# Patient Record
Sex: Female | Born: 1960
Health system: Southern US, Community
[De-identification: ages and names within clinical notes are randomized; demographics above are authoritative.]

## PROBLEM LIST (undated history)

## (undated) DIAGNOSIS — Z972 Presence of dental prosthetic device (complete) (partial): Secondary | ICD-10-CM

## (undated) DIAGNOSIS — Z9889 Other specified postprocedural states: Secondary | ICD-10-CM

## (undated) DIAGNOSIS — M797 Fibromyalgia: Secondary | ICD-10-CM

## (undated) DIAGNOSIS — M1990 Unspecified osteoarthritis, unspecified site: Secondary | ICD-10-CM

## (undated) DIAGNOSIS — T7840XA Allergy, unspecified, initial encounter: Secondary | ICD-10-CM

## (undated) DIAGNOSIS — J449 Chronic obstructive pulmonary disease, unspecified: Secondary | ICD-10-CM

## (undated) DIAGNOSIS — F419 Anxiety disorder, unspecified: Secondary | ICD-10-CM

## (undated) DIAGNOSIS — R112 Nausea with vomiting, unspecified: Secondary | ICD-10-CM

## (undated) DIAGNOSIS — F32A Depression, unspecified: Secondary | ICD-10-CM

## (undated) DIAGNOSIS — K219 Gastro-esophageal reflux disease without esophagitis: Secondary | ICD-10-CM

## (undated) DIAGNOSIS — M51369 Other intervertebral disc degeneration, lumbar region without mention of lumbar back pain or lower extremity pain: Secondary | ICD-10-CM

## (undated) DIAGNOSIS — R0600 Dyspnea, unspecified: Secondary | ICD-10-CM

## (undated) DIAGNOSIS — F319 Bipolar disorder, unspecified: Secondary | ICD-10-CM

## (undated) DIAGNOSIS — J45909 Unspecified asthma, uncomplicated: Secondary | ICD-10-CM

## (undated) DIAGNOSIS — M5126 Other intervertebral disc displacement, lumbar region: Secondary | ICD-10-CM

## (undated) DIAGNOSIS — M5136 Other intervertebral disc degeneration, lumbar region: Secondary | ICD-10-CM

## (undated) DIAGNOSIS — F329 Major depressive disorder, single episode, unspecified: Secondary | ICD-10-CM

## (undated) DIAGNOSIS — G4730 Sleep apnea, unspecified: Secondary | ICD-10-CM

## (undated) HISTORY — PX: WRIST GANGLION EXCISION: SUR520

## (undated) HISTORY — PX: SPINE SURGERY: SHX786

## (undated) HISTORY — DX: Allergy, unspecified, initial encounter: T78.40XA

## (undated) HISTORY — PX: ABDOMINAL HYSTERECTOMY: SHX81

## (undated) HISTORY — DX: Anxiety disorder, unspecified: F41.9

## (undated) HISTORY — PX: JOINT REPLACEMENT: SHX530

## (undated) HISTORY — PX: ABDOMINAL HYSTERECTOMY W/ PARTIAL VAGINACTOMY: SUR660

## (undated) HISTORY — PX: COLONOSCOPY: SHX174

## (undated) HISTORY — DX: Depression, unspecified: F32.A

## (undated) HISTORY — DX: Major depressive disorder, single episode, unspecified: F32.9

## (undated) HISTORY — DX: Unspecified asthma, uncomplicated: J45.909

## (undated) HISTORY — PX: TUBAL LIGATION: SHX77

## (undated) HISTORY — DX: Bipolar disorder, unspecified: F31.9

---

## 1999-01-22 ENCOUNTER — Other Ambulatory Visit: Admission: RE | Admit: 1999-01-22 | Discharge: 1999-01-22 | Payer: Self-pay | Admitting: Orthopaedic Surgery

## 2000-03-11 ENCOUNTER — Other Ambulatory Visit: Admission: RE | Admit: 2000-03-11 | Discharge: 2000-03-11 | Payer: Self-pay | Admitting: *Deleted

## 2006-02-03 ENCOUNTER — Emergency Department: Payer: Self-pay | Admitting: Emergency Medicine

## 2006-07-12 ENCOUNTER — Inpatient Hospital Stay (HOSPITAL_COMMUNITY): Admission: RE | Admit: 2006-07-12 | Discharge: 2006-07-15 | Payer: Self-pay | Admitting: Psychiatry

## 2006-07-12 ENCOUNTER — Emergency Department: Payer: Self-pay | Admitting: General Practice

## 2006-07-12 ENCOUNTER — Ambulatory Visit: Payer: Self-pay | Admitting: Psychiatry

## 2008-10-13 ENCOUNTER — Emergency Department: Payer: Self-pay | Admitting: Internal Medicine

## 2010-06-21 HISTORY — PX: CARDIAC CATHETERIZATION: SHX172

## 2011-06-15 ENCOUNTER — Emergency Department: Payer: Self-pay | Admitting: Emergency Medicine

## 2011-06-25 DIAGNOSIS — R109 Unspecified abdominal pain: Secondary | ICD-10-CM | POA: Insufficient documentation

## 2011-10-25 DIAGNOSIS — Z720 Tobacco use: Secondary | ICD-10-CM | POA: Insufficient documentation

## 2011-10-25 DIAGNOSIS — D8989 Other specified disorders involving the immune mechanism, not elsewhere classified: Secondary | ICD-10-CM | POA: Insufficient documentation

## 2011-10-25 DIAGNOSIS — K219 Gastro-esophageal reflux disease without esophagitis: Secondary | ICD-10-CM | POA: Insufficient documentation

## 2011-10-25 DIAGNOSIS — G9332 Myalgic encephalomyelitis/chronic fatigue syndrome: Secondary | ICD-10-CM | POA: Insufficient documentation

## 2012-02-20 LAB — BASIC METABOLIC PANEL
Anion Gap: 6 — ABNORMAL LOW (ref 7–16)
Calcium, Total: 9.3 mg/dL (ref 8.5–10.1)
Co2: 26 mmol/L (ref 21–32)
EGFR (African American): >60
EGFR (Non-African Amer.): >60
Glucose: 99 mg/dL (ref 65–99)
Osmolality: 274 (ref 275–301)

## 2012-02-20 LAB — CBC
HCT: 40.6 % (ref 35.0–47.0)
HGB: 13.8 g/dL (ref 12.0–16.0)
MCHC: 34.1 g/dL (ref 32.0–36.0)
RDW: 13.7 % (ref 11.5–14.5)
WBC: 11.6 10*3/uL — ABNORMAL HIGH (ref 3.6–11.0)

## 2012-02-21 ENCOUNTER — Inpatient Hospital Stay: Payer: Self-pay | Admitting: Internal Medicine

## 2012-02-22 LAB — BASIC METABOLIC PANEL
Anion Gap: 5 — ABNORMAL LOW (ref 7–16)
BUN: 8 mg/dL (ref 7–18)
Calcium, Total: 8.7 mg/dL (ref 8.5–10.1)
Creatinine: 0.87 mg/dL (ref 0.60–1.30)
EGFR (African American): >60
Glucose: 107 mg/dL — ABNORMAL HIGH (ref 65–99)
Sodium: 140 mmol/L (ref 136–145)

## 2012-02-22 LAB — CBC WITH DIFFERENTIAL/PLATELET
Basophil #: 0.1 10*3/uL (ref 0.0–0.1)
Basophil %: 0.7 %
HCT: 41.5 % (ref 35.0–47.0)
Lymphocyte #: 4.5 10*3/uL — ABNORMAL HIGH (ref 1.0–3.6)
Lymphocyte %: 45.1 %
MCH: 29.2 pg (ref 26.0–34.0)
MCHC: 34.1 g/dL (ref 32.0–36.0)
MCV: 86 fL (ref 80–100)
Monocyte %: 7 %
Neutrophil #: 4.6 10*3/uL (ref 1.4–6.5)
Platelet: 221 10*3/uL (ref 150–440)
RDW: 13.4 % (ref 11.5–14.5)
WBC: 10.1 10*3/uL (ref 3.6–11.0)

## 2012-02-22 LAB — VANCOMYCIN, TROUGH: Vancomycin, Trough: 13 ug/mL (ref 10–20)

## 2012-02-26 LAB — CULTURE, BLOOD (SINGLE)

## 2012-10-10 DIAGNOSIS — F3181 Bipolar II disorder: Secondary | ICD-10-CM | POA: Insufficient documentation

## 2013-02-18 DIAGNOSIS — E669 Obesity, unspecified: Secondary | ICD-10-CM | POA: Insufficient documentation

## 2013-11-27 ENCOUNTER — Ambulatory Visit: Payer: Self-pay | Admitting: Internal Medicine

## 2014-02-13 DIAGNOSIS — Z820 Family history of epilepsy and other diseases of the nervous system: Secondary | ICD-10-CM | POA: Insufficient documentation

## 2014-02-13 DIAGNOSIS — G479 Sleep disorder, unspecified: Secondary | ICD-10-CM | POA: Insufficient documentation

## 2014-02-13 DIAGNOSIS — R2689 Other abnormalities of gait and mobility: Secondary | ICD-10-CM | POA: Insufficient documentation

## 2014-02-13 DIAGNOSIS — R5383 Other fatigue: Secondary | ICD-10-CM | POA: Insufficient documentation

## 2014-02-13 DIAGNOSIS — R252 Cramp and spasm: Secondary | ICD-10-CM | POA: Insufficient documentation

## 2014-04-22 DIAGNOSIS — E782 Mixed hyperlipidemia: Secondary | ICD-10-CM | POA: Insufficient documentation

## 2014-10-08 NOTE — H&P (Signed)
PATIENT NAME:  Denise Spencer, Denise Spencer MR#:  [id number] DATE OF BIRTH:  12-21-1960  DATE OF ADMISSION:  02/21/2012  REFERRING PHYSICIAN: Ferman Hamming, MD  PRIMARY CARE PHYSICIAN: Dr. Leodis Sias [facility]  CHIEF COMPLAINT: Left facial swelling and tenderness.   HISTORY OF PRESENT ILLNESS: This is a 54 year old female who presented to her dentist two days before complaining of left side toothache and oral pain where she told her she had a tooth infection where she started her on p.o. Flagyl where she was instructed to follow-up in one week for tooth extraction. The patient reports the antibiotic did not help her. Her symptoms worsened over the last two days. She complains of significant pain and worsening of the swelling and she is not able to chew and take p.o. intake because of her pain. In the ED, the patient was afebrile, did not have any leukocytosis. CT of neck soft tissue done did not show any evidence of abscess, but did show perimandibular inflammation and left facial inflammation as well. The patient has multiple drug allergies. The hospitalist service was requested to admit the patient for IV antibiotic treatment and IV pain management.   PAST MEDICAL HISTORY:  1. Bipolar disorder.  2. Anxiety.  3. Agoraphobia.   PAST SURGICAL HISTORY:  1. Tubal ligation.  2. Cesarean section.  3. Ganglion cyst removal from right hand.  4. Recent cervical node biopsy which was negative.   FAMILY HISTORY: Denies any history of coronary artery disease, diabetes, or hypertension.   SOCIAL HISTORY: The patient is unemployed. She smokes 1 pack per day. She denies any alcohol or substance abuse.   ALLERGIES: The patient has multiple drug allergies. Aleve, amoxicillin, ampicillin, aspirin, bacitracin, Biaxin, Cipro, clindamycin, codeine, Darvocet, Darvon, doxycycline, erythromycin, Keflex, sulfa drugs, tetracycline, and Tylenol.  HOME MEDICATIONS: 1. Clonazepam 20 mg at bedtime.  2. Effexor-XR 150 mg daily.   3. Omeprazole 20 mg twice a day. 4. Risperdal 0.75 mg at bedtime.  5. Sertraline 150 mg daily.   REVIEW OF SYSTEMS: CONSTITUTIONAL: The patient denies any fever, fatigue, or weakness. EYES: Denies double vision, blurry vision, or pain. ENT: Denies tinnitus, ear pain, or hearing loss. Has left side toothache with swelling and left facial swelling and pain. CARDIOVASCULAR: Denies chest pain, edema, arrhythmia, or palpitations. GASTROINTESTINAL: Denies nausea, vomiting, diarrhea, abdominal pain, or hematemesis. GENITOURINARY: Denies dysuria, hematuria, or renal colic. ENDOCRINE: Denies polyuria, polydipsia, or heat or cold intolerance. HEMATOLOGY: Denies anemia, easy bruising, or bleeding diathesis. MUSCULOSKELETAL: Denies any neck pain, shoulder pain, knee pain, swelling, or gout. NEUROLOGIC: Denies numbness, weakness, dysarthria, epilepsy, tremors, or vertigo. PSYCH: Has history of anxiety and bipolar disorder. Denies any substance or alcohol abuse.   PHYSICAL EXAMINATION:   VITAL SIGNS: Temperature 98.5, pulse 82, respiratory rate 18, blood pressure 134/80, and saturating 96% on room air.   GENERAL: Well-nourished female who lies comfortable in bed, in no apparent distress.   HEENT: Head atraumatic. Left side facial swelling, mainly in the cheek area. Mild tenderness to palpation. No erythema. Poor oral hygiene with multiple dental caries on the left side.   NECK: Has tenderness to palpation on the left side with some cervical lymphadenopathy.   CHEST: Good air entry bilaterally. No wheezing, rales, or rhonchi.   CARDIOVASCULAR: S1 and S2 heard. No rubs, murmurs, or gallops.   ABDOMEN: Soft, nontender, and nondistended. Bowel sounds present.   EXTREMITIES: No edema. No clubbing or cyanosis.   PSYCHIATRIC: Appropriate affect, awake and alert x3. Intact judgment and insight.  NEUROLOGIC: Cranial nerves grossly intact. Motor five out of five in all extremities.   PERTINENT RESULTS:  Glucose 99, BUN 13, creatinine 0.87, sodium 137, potassium 4, chloride 105, and CO2 26. Anion gap 6. White blood cells 11.6, hemoglobin 13.8, hematocrit 40.6, and platelets 209.   CT of neck soft tissue is showing swelling and induration involving the left facial and mandibular region consistent with localized inflammatory process. A definite abscess is not confirmed.  ASSESSMENT AND PLAN:  1. Oral dental infection. The patient will be admitted for IV pain medications as well as for IV antibiotic administration as she failed outpatient p.o. Flagyl. Secondary to her multiple drug allergies, we decided on IV vancomycin. As well she will be on full liquid diet as she is unable to chew from her pain.  2. Tobacco abuse. The patient was counseled and will be started on NicoDerm patch.  3. Bipolar disorder. We will continue the patient on her home medications.  4. Anxiety. We will continue the patient on her home medications.  5. Deep vein thrombosis prophylaxis. Subcutaneous heparin.  6. Gastrointestinal prophylaxis. PPI.   CODE STATUS: FULL CODE.   TOTAL TIME SPENT ON PATIENT CARE: 40 minutes.  ____________________________ Albertine Patricia, MD dse:slb D: 02/21/2012 01:03:23 ET     T: 02/21/2012 12:11:38 ET        JOB#: [id number] cc: Albertine Patricia, MD, <Dictator> PCP - Dr. Leodis Sias - Festus Aloe Zuma Hust Graciela Husbands MD ELECTRONICALLY SIGNED 02/22/2012 0:27

## 2014-10-08 NOTE — Discharge Summary (Signed)
PATIENT NAME:  Denise Spencer, Denise Spencer MR#:  [id number] DATE OF BIRTH:  [birth year]  DATE OF ADMISSION:  02/21/2012 DATE OF DISCHARGE:  02/23/2012  DISCHARGE DIAGNOSES:  1. Left-sided oral dental infection, improving on clindamycin.  2. Tobacco abuse. Counseled for about three minutes. She is on a nicotine patch and is trying to quit.   SECONDARY DIAGNOSES:  1. Bipolar disorder.  2. Anxiety.  3. Agoraphobia.  CONSULTATIONS: None.   PROCEDURES/RADIOLOGY: CT scan of the neck with contrast on 09/01 showed findings consistent with underlying inflammatory change and induration in the perimandibular soft tissues on the left without defined abscess. Small reactive lymph node in the cervical chain. No adenopathy.   MAJOR LABORATORY PANEL: Blood cultures times two were negative.   HISTORY AND SHORT HOSPITAL COURSE: The patient is a 54 year old female with the above-mentioned medical problems who was admitted for oral dental infection.  She was started on IV vancomycin and eventually was switched over to clindamycin. She had significant improvement. Considering her known allergy to clindamycin she was watched one more day to make sure she did not have any severe reaction to clindamycin and she did well with clindamycin without any allergic reaction.  With clinical improvement she was discharged home in stable condition on 09/04.  VITAL SIGNS: On the date of discharge, her vital signs were as follows: Temperature 97.9, heart rate 77 per minute, respirations 18 per minute, blood pressure 115/74 mmHg.  She was saturating 98% on room air.   PERTINENT PHYSICAL EXAMINATION ON THE DATE OF DISCHARGE: CARDIOVASCULAR: S1, S2 normal. No murmurs, rubs, or gallop. LUNGS: Clear to auscultation bilaterally. No wheezes, rales, rhonchi, or crepitation. ABDOMEN: Soft, benign.  NEUROLOGIC: Nonfocal examination.  HEENT: Oral cavity showed left-sided facial swelling mainly around her cheek area. She did have minimal tenderness to  palpation and intraoral exam showed  poor oral hygiene with multiple dental caries on the left side. All other physical examination remained at baseline.   DISCHARGE MEDICATIONS: 1. Clonazepam 0.5 mg, 4 tablets p.o. at bedtime. 2. Risperdal 0.25 mg, 3 tablets p.o. at bedtime.  3. Sertraline 150 mg p.o. daily.  4. Effexor XR 75 mg, 2 capsules p.o. every morning.  5. Omeprazole 20 mg p.o. b.i.d.  6. Nicotine 21 mg transdermal patch once daily.  7. Clindamycin 300 mg p.o. every six hours for a total of nine more days.  8. Acetaminophen/hydrocodone 325/10 mg 1 tablet every six hours for five days as needed.   DISCHARGE DIET: Regular.   DISCHARGE ACTIVITY: As tolerated.   DISCHARGE INSTRUCTIONS AND FOLLOWUP: 1. The patient was instructed to follow up with her primary care physician, Dr. Apolonio Schneiders, in 1 to 2 weeks.  2. He will need followup with her regular dentist also after following up with her primary care physician in about 1 to 2 weeks.   TOTAL TIME DISCHARGING THIS PATIENT: 55 minutes.  ____________________________ Lucina Mellow. Manuella Ghazi, MD vss:bjt D: 02/23/2012 23:16:06 ET T: 02/25/2012 11:39:36 ET JOB#: [id number]  cc: Vianne Bulls. Arline Asp, [place] MD ELECTRONICALLY SIGNED 02/25/2012 23:23

## 2014-12-17 ENCOUNTER — Other Ambulatory Visit: Payer: Self-pay | Admitting: Internal Medicine

## 2014-12-17 DIAGNOSIS — Z1231 Encounter for screening mammogram for malignant neoplasm of breast: Secondary | ICD-10-CM

## 2014-12-18 ENCOUNTER — Ambulatory Visit
Admission: RE | Admit: 2014-12-18 | Discharge: 2014-12-18 | Disposition: A | Discharge status: Home / Self Care | Payer: Medicare HMO | Source: Ambulatory Visit | Attending: Internal Medicine | Admitting: Internal Medicine

## 2014-12-18 DIAGNOSIS — Z1231 Encounter for screening mammogram for malignant neoplasm of breast: Secondary | ICD-10-CM | POA: Diagnosis not present

## 2014-12-20 ENCOUNTER — Emergency Department: Payer: Medicare HMO

## 2014-12-20 ENCOUNTER — Emergency Department
Admission: EM | Admit: 2014-12-20 | Discharge: 2014-12-20 | Disposition: A | Discharge status: Home / Self Care | Payer: Medicare HMO | Attending: Student | Admitting: Student

## 2014-12-20 DIAGNOSIS — Y998 Other external cause status: Secondary | ICD-10-CM | POA: Insufficient documentation

## 2014-12-20 DIAGNOSIS — S99911A Unspecified injury of right ankle, initial encounter: Secondary | ICD-10-CM | POA: Diagnosis present

## 2014-12-20 DIAGNOSIS — Y9289 Other specified places as the place of occurrence of the external cause: Secondary | ICD-10-CM | POA: Insufficient documentation

## 2014-12-20 DIAGNOSIS — S93401A Sprain of unspecified ligament of right ankle, initial encounter: Secondary | ICD-10-CM | POA: Insufficient documentation

## 2014-12-20 DIAGNOSIS — Y9389 Activity, other specified: Secondary | ICD-10-CM | POA: Insufficient documentation

## 2014-12-20 DIAGNOSIS — W1839XA Other fall on same level, initial encounter: Secondary | ICD-10-CM | POA: Diagnosis not present

## 2014-12-20 MED ORDER — HYDROCODONE-ACETAMINOPHEN 5-325 MG PO TABS
1.0000 | ORAL_TABLET | ORAL | Status: AC
  Administered 2014-12-20: 1 via ORAL

## 2014-12-20 MED ORDER — HYDROCODONE-ACETAMINOPHEN 5-325 MG PO TABS
ORAL_TABLET | ORAL | Status: AC
  Administered 2014-12-20: 1 via ORAL
  Filled 2014-12-20: qty 1

## 2014-12-20 MED ORDER — HYDROCODONE-ACETAMINOPHEN 5-325 MG PO TABS: 1.0000 | ORAL_TABLET | Freq: Four times a day (QID) | ORAL | Status: DC | PRN

## 2014-12-20 NOTE — ED Notes (Signed)
Pt states tripped in a hole at 230 injuring right ankle. Pt with swelling noted to right ankle. Cap refill approx 4 seconds to toes, motor intact, pt states sensation dimmed.

## 2014-12-20 NOTE — ED Notes (Signed)
Pt reports injury to right ankle after stepping in a hole and twisting it.  Pt reports some tingling to toes, but still has some sensation.  Motor and circulation intact.  Pt NAD at this time.  Ice in place over ankle.

## 2014-12-20 NOTE — Discharge Instructions (Signed)

## 2014-12-20 NOTE — ED Provider Notes (Signed)
CSN: [id number]     Arrival date & time 12/20/14  2014 History   First MD Initiated Contact with Patient 12/20/14 2152     Chief Complaint  Patient presents with  . Ankle Injury     (Consider location/radiation/quality/duration/timing/severity/associated sxs/prior Treatment) HPI 54 year old female presents today for evaluation of right ankle pain. Patient fell at approximately 2:30 PM as she was walking and rolled her right ankle. Patient describes 7 out of 10 lateral ankle pain along the lateral malleolus and ATFL ligament. She has been unable to bear weight. She has had swelling. She denies any popping. She denies any knee or hip pain. Patient is unable tolerate NSAIDs.   No past medical history on file. No past surgical history on file. Family History  Problem Relation Age of Onset  . Breast cancer Mother 36   History  Substance Use Topics  . Smoking status: Not on file  . Smokeless tobacco: Not on file  . Alcohol Use: Not on file   OB History    No data available     Review of Systems  Constitutional: Negative for activity change and appetite change.  Cardiovascular: Negative for chest pain and leg swelling.  Gastrointestinal: Negative for abdominal pain.  Musculoskeletal: Positive for joint swelling and gait problem. Negative for back pain and neck pain.  Skin: Negative for color change, rash and wound.  Neurological: Negative for dizziness, syncope and weakness.  Psychiatric/Behavioral: Negative for hallucinations and confusion.  All other systems reviewed and are negative.     Allergies  Aspirin; Doxycycline; Erythromycin; Nsaids; and Penicillins  Home Medications   Prior to Admission medications   Medication Sig Start Date End Date Taking? Authorizing Provider  HYDROcodone-acetaminophen (NORCO) 5-325 MG per tablet Take 1 tablet by mouth every 6 (six) hours as needed for moderate pain or severe pain. 12/20/14   Duanne Guess, PA-C   BP 137/81 mmHg  Pulse 90   Temp(Src) 98.3 F (36.8 C) (Oral)  Resp 14  Ht 5\' 7"  (1.702 m)  Wt 225 lb (102.059 kg)  BMI 35.23 kg/m2  SpO2 98% Physical Exam  Constitutional: She is oriented to person, place, and time. She appears well-developed and well-nourished. No distress.  HENT:  Head: Normocephalic and atraumatic.  Eyes: EOM are normal. Pupils are equal, round, and reactive to light.  Neck: Normal range of motion. Neck supple.  Cardiovascular: Normal rate and regular rhythm.   Pulmonary/Chest: No respiratory distress.  Musculoskeletal:       Right ankle: She exhibits decreased range of motion, swelling and ecchymosis. She exhibits no deformity, no laceration and normal pulse. Tenderness. Lateral malleolus and AITFL tenderness found. Achilles tendon exhibits no pain, no defect and normal Thompson's test results.       Left ankle: She exhibits decreased range of motion, swelling and ecchymosis. She exhibits no deformity, no laceration and normal pulse. Tenderness. Lateral malleolus and AITFL tenderness found. Achilles tendon exhibits no pain, no defect and normal Thompson's test results.  Neurological: She is alert and oriented to person, place, and time.  Skin: Skin is warm and dry.  Psychiatric: She has a normal mood and affect. Her behavior is normal. Judgment and thought content normal.    ED Course  Procedures (including critical care time)  SPLINT APPLICATION Date/Time: 29:79 PM Authorized by: Feliberto Gottron Consent: Verbal consent obtained. Risks and benefits: risks, benefits and alternatives were discussed Consent given by: patient Splint applied by: Emergency Department technician Location details: Right ankle  Splint type: Stirrup splint  Supplies used: Stirrup splint  Post-procedure: The splinted body part was neurovascularly unchanged following the procedure. Patient tolerance: Patient tolerated the procedure well with no immediate complications.    Labs Review Labs  Reviewed - No data to display  Imaging Review Dg Ankle Complete Right  12/20/2014   CLINICAL DATA:  Initial encounter for trauma at 230 with lateral pain. Swelling.  EXAM: RIGHT ANKLE - COMPLETE 3+ VIEW  COMPARISON:  09/27/2012, report not available.  FINDINGS: Mild lateral malleolar soft tissue swelling. No acute fracture or dislocation. Base of fifth metatarsal and talar dome intact. Mild tibiotalar osteoarthritis, with joint space narrowing, osteophyte formation anteriorly on the lateral view. Small calcaneal spur.  IMPRESSION: Soft tissue swelling and degenerative change. No acute osseous abnormality.   Electronically Signed   By: Abigail Miyamoto M.D.   On: 12/20/2014 20:53     EKG Interpretation None      MDM   Final diagnoses:  Right ankle sprain, initial encounter    54 year old female with right lateral ankle sprain. X-ray showed no evidence of acute fracture. Patient was given a walker to help with ambulation. She is educated on rest ice compression and elevation. She was given ankle stirrup for stability. She will follow-up with orthopedics in 5-7 days if no relief.    Duanne Guess, PA-C 12/20/14 2206  Joanne Gavel, MD 12/21/14 [phone]

## 2015-01-01 ENCOUNTER — Ambulatory Visit: Payer: Medicare HMO

## 2015-01-01 ENCOUNTER — Encounter: Payer: Self-pay | Admitting: Oncology

## 2015-01-01 ENCOUNTER — Inpatient Hospital Stay: Payer: Medicare HMO | Attending: Oncology | Admitting: Oncology

## 2015-01-01 VITALS — BP 122/83 | HR 84 | Temp 97.4°F | Resp 20 | Wt 222.4 lb

## 2015-01-01 DIAGNOSIS — Z79899 Other long term (current) drug therapy: Secondary | ICD-10-CM

## 2015-01-01 DIAGNOSIS — D751 Secondary polycythemia: Secondary | ICD-10-CM | POA: Diagnosis not present

## 2015-01-01 DIAGNOSIS — F17210 Nicotine dependence, cigarettes, uncomplicated: Secondary | ICD-10-CM

## 2015-01-01 DIAGNOSIS — Z90710 Acquired absence of both cervix and uterus: Secondary | ICD-10-CM

## 2015-01-01 DIAGNOSIS — D72829 Elevated white blood cell count, unspecified: Secondary | ICD-10-CM | POA: Diagnosis not present

## 2015-01-01 DIAGNOSIS — F418 Other specified anxiety disorders: Secondary | ICD-10-CM | POA: Diagnosis not present

## 2015-01-01 LAB — IRON AND TIBC
Iron: 81 ug/dL (ref 28–170)
Saturation Ratios: 16 % (ref 10.4–31.8)
TIBC: 511 ug/dL — ABNORMAL HIGH (ref 250–450)
UIBC: 430 ug/dL

## 2015-01-01 LAB — CBC
HEMATOCRIT: 49.1 % — AB (ref 35.0–47.0)
Hemoglobin: 15.7 g/dL (ref 12.0–16.0)
MCH: 27 pg (ref 26.0–34.0)
MCHC: 32 g/dL (ref 32.0–36.0)
MCV: 84.4 fL (ref 80.0–100.0)
Platelets: 262 10*3/uL (ref 150–440)
RBC: 5.82 MIL/uL — AB (ref 3.80–5.20)
RDW: 15.6 % — AB (ref 11.5–14.5)
WBC: 13.1 10*3/uL — AB (ref 3.6–11.0)

## 2015-01-01 LAB — FERRITIN: Ferritin: 13 ng/mL (ref 11–307)

## 2015-01-02 LAB — MISC LABCORP TEST (SEND OUT): LABCORP TEST CODE: 7187

## 2015-01-03 LAB — COMP PANEL: LEUKEMIA/LYMPHOMA

## 2015-01-06 LAB — ERYTHROPOIETIN: ERYTHROPOIETIN: 8.6 m[IU]/mL (ref 2.6–18.5)

## 2015-01-16 NOTE — Progress Notes (Signed)
[place]  [phone] [phone]  ID: Denise Spencer OB: 06-May-1961  MR#: [id number]  ZJQ#:[id number]  Patient Care Team: Perrin Maltese, MD as PCP - General (Internal Medicine)  CHIEF COMPLAINT:  Chief Complaint  Patient presents with  . New Evaluation    abnormal labs    INTERVAL HISTORY: Patient is a 54 year old female who was recently found to have a mildly elevated white blood cell as well as red blood cell count. She currently feels well and is asymptomatic. She denies any recent fevers or illnesses. She has no new medications. She has no neurologic complaints. She has a good appetite and denies weight loss. She denies any chest pain or shortness of breath. She denies any nausea, vomiting, constipation, or diarrhea. She has no urinary complaints. Patient feels at her baseline and offers no specific complaints today.  REVIEW OF SYSTEMS:   Review of Systems  Constitutional: Negative.     As per HPI. Otherwise, a complete review of systems is negatve.  PAST MEDICAL HISTORY: Past Medical History  Diagnosis Date  . Depression   . Anxiety     PAST SURGICAL HISTORY: Past Surgical History  Procedure Laterality Date  . Cesarean section    . Abdominal hysterectomy w/ partial vaginactomy    . Abdominal hysterectomy      FAMILY HISTORY Family History  Problem Relation Age of Onset  . Breast cancer Mother 100       ADVANCED DIRECTIVES:    HEALTH MAINTENANCE: History  Substance Use Topics  . Smoking status: Current Every Day Smoker    Types: Cigarettes  . Smokeless tobacco: Never Used  . Alcohol Use: No     Colonoscopy:  PAP:  Bone density:  Lipid panel:  Allergies  Allergen Reactions  . Aspirin   . Ciprofloxacin     Other reaction(s): RASH  . Doxycycline   . Erythromycin   . Nsaids   . Penicillins   . Sulfa Antibiotics Hives    Other reaction(s): RASH    Current Outpatient Prescriptions  Medication Sig  Dispense Refill  . clonazePAM (KLONOPIN) 1 MG tablet     . cyclobenzaprine (FLEXERIL) 10 MG tablet     . dicyclomine (BENTYL) 10 MG capsule Take 10 mg by mouth.    . estrogen-methylTESTOSTERone (ESTRATEST) 1.25-2.5 MG per tablet Take 1 tablet by mouth daily.  3  . FLUoxetine (PROZAC) 40 MG capsule     . gabapentin (NEURONTIN) 100 MG capsule     . HYDROcodone-acetaminophen (NORCO) 5-325 MG per tablet Take 1 tablet by mouth every 6 (six) hours as needed for moderate pain or severe pain. 8 tablet 0  . Loratadine 10 MG CAPS Take by mouth.    Marland Kitchen omeprazole (PRILOSEC) 20 MG capsule     . simvastatin (ZOCOR) 10 MG tablet Take by mouth.    . traZODone (DESYREL) 100 MG tablet     . vitamin E 400 UNIT capsule      No current facility-administered medications for this visit.    OBJECTIVE: Filed Vitals:   01/01/15 1444  BP: 122/83  Pulse: 84  Temp: 97.4 F (36.3 C)  Resp: 20     Body mass index is 34.83 kg/(m^2).    ECOG FS:0 - Asymptomatic  General: Well-developed, well-nourished, no acute distress. Eyes: Pink conjunctiva, anicteric sclera. HEENT: Normocephalic, moist mucous membranes, clear oropharnyx. Lungs: Clear to auscultation bilaterally. Heart: Regular rate and rhythm. No rubs, murmurs, or gallops. Abdomen: Soft, nontender, nondistended.  No organomegaly noted, normoactive bowel sounds. Musculoskeletal: No edema, cyanosis, or clubbing. Neuro: Alert, answering all questions appropriately. Cranial nerves grossly intact. Skin: No rashes or petechiae noted. Psych: Normal affect. Lymphatics: No cervical, calvicular, axillary or inguinal LAD.   LAB RESULTS:  Lab Results  Component Value Date   NA 140 02/22/2012   K 4.0 02/22/2012   CL 106 02/22/2012   CO2 29 02/22/2012   GLUCOSE 107* 02/22/2012   BUN 8 02/22/2012   CREATININE 0.87 02/22/2012   CALCIUM 8.7 02/22/2012   GFRNONAA >60 02/22/2012   GFRAA >60 02/22/2012    Lab Results  Component Value Date   WBC 13.1*  01/01/2015   NEUTROABS 4.6 02/22/2012   HGB 15.7 01/01/2015   HCT 49.1* 01/01/2015   MCV 84.4 01/01/2015   PLT 262 01/01/2015     STUDIES: Dg Ankle Complete Right  12/20/2014   CLINICAL DATA:  Initial encounter for trauma at 230 with lateral pain. Swelling.  EXAM: RIGHT ANKLE - COMPLETE 3+ VIEW  COMPARISON:  09/27/2012, report not available.  FINDINGS: Mild lateral malleolar soft tissue swelling. No acute fracture or dislocation. Base of fifth metatarsal and talar dome intact. Mild tibiotalar osteoarthritis, with joint space narrowing, osteophyte formation anteriorly on the lateral view. Small calcaneal spur.  IMPRESSION: Soft tissue swelling and degenerative change. No acute osseous abnormality.   Electronically Signed   By: Abigail Miyamoto M.D.   On: 12/20/2014 20:53   Mm Digital Screening Bilateral  12/18/2014   CLINICAL DATA:  Screening.  EXAM: DIGITAL SCREENING BILATERAL MAMMOGRAM WITH CAD  COMPARISON:  Previous exam(s).  ACR Breast Density Category b: There are scattered areas of fibroglandular density.  FINDINGS: There are no findings suspicious for malignancy. Images were processed with CAD.  IMPRESSION: No mammographic evidence of malignancy. A result letter of this screening mammogram will be mailed directly to the patient.  RECOMMENDATION: Screening mammogram in one year. (Code:SM-B-01Y)  BI-RADS CATEGORY  1: Negative.   Electronically Signed   By: Pamelia Hoit M.D.   On: 12/18/2014 10:38    ASSESSMENT: Mild leukocytosis and polycythemia.  PLAN:    1. Leukocytosis: Patient's white blood cell count is only mildly elevated and relatively stable. Both peripheral blood flow cytometry and BCR-ABL mutation are negative. The remainder of her laboratory work is also either negative or within normal limits. No intervention is needed at this time. Patient does not require bone marrow biopsy. Return to clinic in 3 months with repeat laboratory work and further evaluation. 2. Polycythemia: Patient has  an increased carboxyhemoglobin indicating her polycythemia is secondary likely secondary to her tobacco use. The remainder of her laboratory work is also either negative or within normal limits. Patient was given smoking cessation counseling. No further intervention is needed. Follow-up as above.  Patient expressed understanding and was in agreement with this plan. She also understands that She can call clinic at any time with any questions, concerns, or complaints.    Lloyd Huger, MD   01/16/2015 9:19 AM

## 2015-02-10 ENCOUNTER — Other Ambulatory Visit: Payer: Self-pay | Admitting: Psychiatry

## 2015-03-19 LAB — BCR-ABL1 KINASE DOMAIN MUTATION ANALYSIS

## 2015-03-20 ENCOUNTER — Other Ambulatory Visit: Payer: Self-pay | Admitting: Internal Medicine

## 2015-03-20 DIAGNOSIS — Z1231 Encounter for screening mammogram for malignant neoplasm of breast: Secondary | ICD-10-CM

## 2015-03-26 ENCOUNTER — Other Ambulatory Visit: Payer: Self-pay | Admitting: Psychiatry

## 2015-03-28 ENCOUNTER — Ambulatory Visit: Payer: Medicare HMO

## 2015-04-03 ENCOUNTER — Ambulatory Visit: Payer: Medicare HMO | Admitting: Oncology

## 2015-04-03 ENCOUNTER — Other Ambulatory Visit: Payer: Medicare HMO

## 2015-04-07 ENCOUNTER — Encounter: Payer: Medicare HMO | Admitting: Pain Medicine

## 2015-04-29 ENCOUNTER — Other Ambulatory Visit: Payer: Self-pay | Admitting: Psychiatry

## 2015-05-05 ENCOUNTER — Ambulatory Visit (INDEPENDENT_AMBULATORY_CARE_PROVIDER_SITE_OTHER): Payer: Medicare HMO | Admitting: Psychiatry

## 2015-05-05 ENCOUNTER — Encounter: Payer: Self-pay | Admitting: Psychiatry

## 2015-05-05 VITALS — BP 138/88 | HR 110 | Temp 97.7°F | Ht 67.0 in | Wt 228.8 lb

## 2015-05-05 DIAGNOSIS — F332 Major depressive disorder, recurrent severe without psychotic features: Secondary | ICD-10-CM

## 2015-05-05 MED ORDER — BUSPIRONE HCL 10 MG PO TABS: 10.0000 mg | ORAL_TABLET | Freq: Three times a day (TID) | ORAL | Status: DC

## 2015-05-05 MED ORDER — TRAZODONE HCL 100 MG PO TABS: 100.0000 mg | ORAL_TABLET | Freq: Every day | ORAL | Status: DC

## 2015-05-05 MED ORDER — FLUOXETINE HCL 40 MG PO CAPS: 80.0000 mg | ORAL_CAPSULE | Freq: Every day | ORAL | Status: DC

## 2015-05-05 NOTE — Progress Notes (Signed)
[facility] MD Progress Note  05/05/2015 8:22 PM Denise Spencer  MRN:  [id number] Subjective:  "I think my Prozac needs to be increased" follow-up for this 54 year 54-year-old woman with a history of depression. Patient reports that recently she has been under a lot more stress. She is worried about her daughter and her son who live at home particularly her daughter. She gets nervous a lot of the time. Has trouble sleeping at times. Patient denies suicidal ideation. Denies any psychotic symptoms. Says that she is still taking her medicine as previously. No newew physical complaints. Principal Problem: @PPROB @ Diagnosis:   Patient Active Problem List   Diagnosis Date Noted  . Disordered sleep [G47.9] 02/13/2014  . Spasm [R25.2] 02/13/2014  . Imbalance [R26.89] 02/13/2014  . Fatigue [R53.83] 02/13/2014  . Family history of MS (multiple sclerosis) [Z82.0] 02/13/2014  . Class 1 obesity [E66.9] 02/18/2013  . Bipolar II disorder ([place]) [F31.81] 10/10/2012  . Current tobacco use [Z72.0] 10/25/2011  . Acid reflux [K21.9] 10/25/2011  . CFIDS (chronic fatigue and immune dysfunction syndrome) [R53.82] 10/25/2011  . Abdominal pain [R10.9] 06/25/2011   Total Time spent with patient: 25 minutes  Past Psychiatric History: patient has past history of recurrent severe depression but responsive to medicine. No history of actual suicide attempts. Multiple medical problems. No history of substance abuse identified.  Past Medical History:  Past Medical History  Diagnosis Date  . Depression   . Anxiety   . Bipolar disorder [facility])     Past Surgical History  Procedure Laterality Date  . Cesarean section    . Abdominal hysterectomy w/ partial vaginactomy    . Abdominal hysterectomy    . Wrist ganglion excision     Family History:  Family History  Problem Relation Age of Onset  . Breast cancer Mother 40  . Bipolar disorder Mother   . Depression Mother   . Anxiety disorder Mother   . Heart attack Father    . Thyroid cancer Father   . Hypertension Father   . Depression Sister   . Bipolar disorder Sister   . Hypertension Sister   . Bipolar disorder Sister   . Lymphoma Sister   . Anxiety disorder Sister   . Bipolar disorder Sister    Family Psychiatric  History: family history positive for anxiety and depression possible bipolar disorder in her daughter. Social History:  History  Alcohol Use No     History  Drug Use No    Social History   Social History  . Marital Status: Single    Spouse Name: N/A  . Number of Children: N/A  . Years of Education: N/A   Social History Main Topics  . Smoking status: Current Every Day Smoker -- 1.00 packs/day    Types: Cigarettes    Start date: 05/04/1977  . Smokeless tobacco: Never Used  . Alcohol Use: No  . Drug Use: No  . Sexual Activity: Yes   Other Topics Concern  . None   Social History Narrative   Additional Social History:                         Sleep: Fair  Appetite:  Fair  Current Medications: Current Outpatient Prescriptions  Medication Sig Dispense Refill  . clonazePAM (KLONOPIN) 1 MG tablet     . cyclobenzaprine (FLEXERIL) 10 MG tablet     . dicyclomine (BENTYL) 10 MG capsule Take 10 mg by mouth.    . estrogen-methylTESTOSTERone (  ESTRATEST) 1.25-2.5 MG per tablet Take 1 tablet by mouth daily.  3  . FLUoxetine (PROZAC) 40 MG capsule Take 2 capsules (80 mg total) by mouth daily. 180 capsule 1  . gabapentin (NEURONTIN) 100 MG capsule     . HYDROcodone-acetaminophen (NORCO) 5-325 MG per tablet Take 1 tablet by mouth every 6 (six) hours as needed for moderate pain or severe pain. 8 tablet 0  . Loratadine 10 MG CAPS Take by mouth.    Marland Kitchen omeprazole (PRILOSEC) 20 MG capsule     . simvastatin (ZOCOR) 10 MG tablet Take by mouth.    . traZODone (DESYREL) 100 MG tablet Take 1 tablet (100 mg total) by mouth at bedtime. 90 tablet 1  . vitamin E 400 UNIT capsule     . busPIRone (BUSPAR) 10 MG tablet Take 1 tablet (10  mg total) by mouth 3 (three) times daily. 270 tablet 1   No current facility-administered medications for this visit.    Lab Results: No results found for this or any previous visit (from the past 48 hour(s)).  Physical Findings: AIMS:  , ,  ,  ,    CIWA:    COWS:     Musculoskeletal: Strength & Muscle Tone: within normal limits Gait & Station: normal Patient leans: N/A  Psychiatric Specialty Exam: ROS  Blood pressure 138/88, pulse 110, temperature 97.7 F (36.5 C), temperature source Tympanic, height 5\' 7"  (1.702 m), weight 228 lb 12.8 oz (103.783 kg), SpO2 92 %.Body mass index is 35.83 kg/(m^2).  General Appearance: Casual  Eye Contact::  Fair  Speech:  Clear and Coherent  Volume:  Normal  Mood:  Dysphoric  Affect:  Congruent  Thought Process:  Logical  Orientation:  Full (Time, Place, and Person)  Thought Content:  Negative  Suicidal Thoughts:  No  Homicidal Thoughts:  No  Memory:  Immediate;   Fair Recent;   Fair Remote;   Fair  Judgement:  Fair  Insight:  Fair  Psychomotor Activity:  Normal  Concentration:  Good  Recall:  Good  Fund of Knowledge:Good  Language: Good  Akathisia:  No  Handed:  Right  AIMS (if indicated):     Assets:  Communication Skills Desire for Improvement Financial Resources/Insurance Housing Resilience  ADL's:  Intact  Cognition: WNL  Sleep:      Treatment Plan Summary: Medication management and Plan I propose that we add buspirone to her medicine adding 10 mg 3 times a day to her current Prozac as I don't think we can go up on the dose of it. Continue low-dose of clonazepam. Supportive counseling done. Encourage patient to be forthright with her family in taking care of herself at home. Follow-up in 6 months. I have also encouraged her to see a counselor here in our office.  John [name] 05/05/2015, 8:22 PM

## 2015-06-16 ENCOUNTER — Other Ambulatory Visit: Payer: Self-pay | Admitting: Psychiatry

## 2015-08-21 ENCOUNTER — Other Ambulatory Visit: Payer: Self-pay | Admitting: Psychiatry

## 2015-09-01 ENCOUNTER — Ambulatory Visit: Payer: Self-pay | Admitting: General Surgery

## 2015-09-03 ENCOUNTER — Encounter: Payer: Self-pay | Admitting: General Surgery

## 2015-09-03 ENCOUNTER — Ambulatory Visit (INDEPENDENT_AMBULATORY_CARE_PROVIDER_SITE_OTHER): Payer: Medicare HMO | Admitting: General Surgery

## 2015-09-03 VITALS — BP 134/80 | HR 74 | Resp 14 | Ht 66.0 in | Wt 251.0 lb

## 2015-09-03 DIAGNOSIS — L723 Sebaceous cyst: Secondary | ICD-10-CM

## 2015-09-03 NOTE — Progress Notes (Signed)
Patient ID: Denise Spencer, female   DOB: 01-26-1961, 55 y.o.   MRN: [id number]  Chief Complaint  Patient presents with  . other    cyst on back     HPI Denise Spencer is a 55 y.o. female here today for a evalaution of a cyst on back. Patient states she is has had it for 5 years. No Pain or drainage until 2 months ago. States the drainage went from pus colored, to black, to bloody. HPI I have reviewed the history of present illness with the patient.  Past Medical History  Diagnosis Date  . Depression   . Anxiety   . Bipolar disorder [facility])     Past Surgical History  Procedure Laterality Date  . Cesarean section    . Abdominal hysterectomy w/ partial vaginactomy    . Abdominal hysterectomy    . Wrist ganglion excision    . Colonoscopy      Family History  Problem Relation Age of Onset  . Breast cancer Mother 29  . Bipolar disorder Mother   . Depression Mother   . Anxiety disorder Mother   . Heart attack Father   . Thyroid cancer Father   . Hypertension Father   . Depression Sister   . Bipolar disorder Sister   . Hypertension Sister   . Bipolar disorder Sister   . Lymphoma Sister   . Anxiety disorder Sister   . Bipolar disorder Sister     Social History Social History  Substance Use Topics  . Smoking status: Current Every Day Smoker -- 1.00 packs/day    Types: Cigarettes    Start date: 05/04/1977  . Smokeless tobacco: Never Used  . Alcohol Use: No    Allergies  Allergen Reactions  . Aspirin   . Ciprofloxacin     Other reaction(s): RASH  . Doxycycline   . Erythromycin   . Nsaids   . Penicillins   . Sulfa Antibiotics Hives    Other reaction(s): RASH    Current Outpatient Prescriptions  Medication Sig Dispense Refill  . clonazePAM (KLONOPIN) 1 MG tablet     . cyclobenzaprine (FLEXERIL) 10 MG tablet     . dicyclomine (BENTYL) 10 MG capsule Take 10 mg by mouth.    . estrogen-methylTESTOSTERone (ESTRATEST) 1.25-2.5 MG per tablet Take 1 tablet by  mouth daily.  3  . FLUoxetine (PROZAC) 40 MG capsule Take 2 capsules (80 mg total) by mouth daily. 180 capsule 1  . gabapentin (NEURONTIN) 100 MG capsule     . Loratadine 10 MG CAPS Take by mouth.    Marland Kitchen omeprazole (PRILOSEC) 20 MG capsule     . simvastatin (ZOCOR) 10 MG tablet Take by mouth.    . traZODone (DESYREL) 100 MG tablet Take 1 tablet (100 mg total) by mouth at bedtime. 90 tablet 1   No current facility-administered medications for this visit.    Review of Systems Review of Systems  Constitutional: Negative.   Respiratory: Negative.   Cardiovascular: Negative.     Blood pressure 134/80, pulse 74, resp. rate 14, height 5\' 6"  (1.676 m), weight 251 lb (113.853 kg).  Physical Exam Physical Exam  Constitutional: She is oriented to person, place, and time. She appears well-developed and well-nourished.  Eyes: Conjunctivae are normal. No scleral icterus.  Neck: Neck supple.  Cardiovascular: Normal rate, regular rhythm and normal heart sounds.   Pulmonary/Chest: Effort normal and breath sounds normal.  Lymphadenopathy:    She has no cervical adenopathy.  Neurological:  She is alert and oriented to person, place, and time.  Skin: Skin is warm and dry.       Data Reviewed Notes reviewed  Assessment    Small, inflamed cyst on back right shoulder.     Plan    Pt to return in 3 weeks for cyst excision in the office. No need for antibiotic at present. If there is any increase in size, pain or discomfort before then she is advised to call here    PCP:  Lamonte Sakai S This information has been scribed by Gaspar Cola CMA.    SANKAR,SEEPLAPUTHUR G 09/03/2015, 4:19 PM

## 2015-09-03 NOTE — Patient Instructions (Signed)
Follow-up in 3 weeks

## 2015-09-09 ENCOUNTER — Other Ambulatory Visit: Payer: Self-pay

## 2015-09-09 NOTE — Telephone Encounter (Signed)
pt called states she needs her clonazepam sent to [facility]. pt was last seen on  05-05-15 next appt 10-23-15.

## 2015-09-12 ENCOUNTER — Other Ambulatory Visit: Payer: Self-pay | Admitting: Psychiatry

## 2015-09-12 MED ORDER — CLONAZEPAM 1 MG PO TABS: 1.0000 mg | ORAL_TABLET | Freq: Three times a day (TID) | ORAL | Status: DC

## 2015-09-12 NOTE — Telephone Encounter (Signed)
I will print out a prescription for this. Can we please send it by fax to [facility]?

## 2015-09-22 ENCOUNTER — Other Ambulatory Visit: Payer: Self-pay | Admitting: Psychiatry

## 2015-09-24 ENCOUNTER — Encounter: Payer: Self-pay | Admitting: General Surgery

## 2015-09-24 ENCOUNTER — Ambulatory Visit (INDEPENDENT_AMBULATORY_CARE_PROVIDER_SITE_OTHER): Payer: Medicare HMO | Admitting: General Surgery

## 2015-09-24 VITALS — BP 146/78 | HR 78 | Resp 14 | Ht 67.0 in | Wt 230.0 lb

## 2015-09-24 DIAGNOSIS — L723 Sebaceous cyst: Secondary | ICD-10-CM

## 2015-09-24 NOTE — Progress Notes (Signed)
Here today for follow up and excision cyst on right posterior shoulder. I have reviewed the history of present illness with the patient.  Procedure: excision sebaceous cyst, intermadiate closure. Anesthetic: 57ml of 0.5% marcaine mixed with 1% xylocaine. Prep: Chloro prep Description: After local anesthetic and prep, area was draped out. Transverse elliptical incision 3.5cm long was made. The cyst with the ellipse of skin was completely removed.and sent for pathology. Resulting wound measured 3.5 by 2 by 2cm. Bleeding controlled with cautery. Deep tissue closed with 3-0 Vicryl. Skin closed with vertical mattress sutures of 4-0 nylon. Neosporin ointment, telfa, gauze and tegaderm dressing. No immediate problems from procedure. Advised on wound care. Rx given fro Vicodin 5 mg, # 15, one po q6h prn for pain     follow up in 10 days for suture removal. The patient is aware to call back for any questions or concerns.   PCP:  Lamonte Sakai This information has been scribed by Karie Fetch RN, BSN,BC.

## 2015-09-24 NOTE — Patient Instructions (Addendum)
Keep area clean The patient is aware to call back for any questions or concerns.  

## 2015-09-25 ENCOUNTER — Encounter: Payer: Self-pay | Admitting: General Surgery

## 2015-09-29 ENCOUNTER — Other Ambulatory Visit: Payer: Self-pay

## 2015-09-29 NOTE — Telephone Encounter (Signed)
pt states she need two rx send for medication she needs a 90 day supply sent to mail order and then one to a local pharmacy so she can get she will be out of medication before mail order will get to her.  Pt needs clonazepam.

## 2015-09-30 ENCOUNTER — Telehealth: Payer: Self-pay | Admitting: *Deleted

## 2015-09-30 NOTE — Telephone Encounter (Signed)
-----   Message from Christene Lye, MD sent at 09/30/2015  7:04 AM EDT ----- Rosann Auerbach, please let pt pt know the pathology was normal.

## 2015-10-01 NOTE — Telephone Encounter (Signed)
Patient called the office back and is now aware of pathology results.

## 2015-10-02 NOTE — Telephone Encounter (Signed)
pt left a message in regards to the medication getting refilled.

## 2015-10-06 ENCOUNTER — Ambulatory Visit (INDEPENDENT_AMBULATORY_CARE_PROVIDER_SITE_OTHER): Payer: Medicare HMO | Admitting: *Deleted

## 2015-10-06 DIAGNOSIS — L723 Sebaceous cyst: Secondary | ICD-10-CM

## 2015-10-06 NOTE — Progress Notes (Signed)
Patient came in today for a wound check.  The wound is clean, with no signs of infection noted. The sutures were removed and steri strips applied.  

## 2015-10-16 ENCOUNTER — Telehealth: Payer: Self-pay

## 2015-10-16 NOTE — Telephone Encounter (Signed)
pt states she is completely out and that she has not got anymore medication, she needs at least enough to make it threw until she get her mail order in.

## 2015-10-16 NOTE — Telephone Encounter (Signed)
I telephoned in a 15 day supply to the [facility] pharmacy

## 2015-10-19 ENCOUNTER — Emergency Department: Payer: Medicare HMO

## 2015-10-19 ENCOUNTER — Encounter: Payer: Self-pay | Admitting: Emergency Medicine

## 2015-10-19 ENCOUNTER — Emergency Department
Admission: EM | Admit: 2015-10-19 | Discharge: 2015-10-19 | Disposition: A | Discharge status: Home / Self Care | Payer: Medicare HMO | Attending: Emergency Medicine | Admitting: Emergency Medicine

## 2015-10-19 DIAGNOSIS — R2243 Localized swelling, mass and lump, lower limb, bilateral: Secondary | ICD-10-CM | POA: Insufficient documentation

## 2015-10-19 DIAGNOSIS — F17210 Nicotine dependence, cigarettes, uncomplicated: Secondary | ICD-10-CM | POA: Diagnosis not present

## 2015-10-19 DIAGNOSIS — Z79899 Other long term (current) drug therapy: Secondary | ICD-10-CM | POA: Insufficient documentation

## 2015-10-19 DIAGNOSIS — R600 Localized edema: Secondary | ICD-10-CM

## 2015-10-19 DIAGNOSIS — F329 Major depressive disorder, single episode, unspecified: Secondary | ICD-10-CM | POA: Insufficient documentation

## 2015-10-19 MED ORDER — FUROSEMIDE 20 MG PO TABS: 20.0000 mg | ORAL_TABLET | Freq: Every day | ORAL | Status: DC

## 2015-10-19 MED ORDER — TRAMADOL HCL 50 MG PO TABS: 50.0000 mg | ORAL_TABLET | Freq: Four times a day (QID) | ORAL | Status: DC | PRN

## 2015-10-19 NOTE — ED Notes (Signed)
Patient c/o bilateral knee pain and swelling for about 1 week. Patient reports pain when walking, says that she has tried anti-inflammatories, ice, and elevating her legs without any relief.

## 2015-10-19 NOTE — ED Provider Notes (Signed)
[facility] Emergency Department Provider Note   ____________________________________________  Time seen: Approximately 4:22 PM  I have reviewed the triage vital signs and the nursing notes.   HISTORY  Chief Complaint Joint Swelling    HPI Denise Spencer is a 55 y.o. female patient today complaining of bilateral knee edema. Patient also has some mild right foot swelling. Patient stated this been going on for 2 years. Patient states she does not follow orthopedics as directed from her last visit to the ER. Patient states she is unhappy with her present orthopedics and is now shopping for new orthopedic Dr. Patient states she was told she needed MRI to get a complete patient was properly leg. Patient states she cannot afford out of pocket expenses to come to the ED for MRI.Patient state her swelling and pain has increased in the last week. Patient rates the pain as a 6/10. No palliative measures taken for this complaint.   Past Medical History  Diagnosis Date  . Depression   . Anxiety   . Bipolar disorder [facility])     Patient Active Problem List   Diagnosis Date Noted  . Disordered sleep 02/13/2014  . Spasm 02/13/2014  . Imbalance 02/13/2014  . Fatigue 02/13/2014  . Family history of MS (multiple sclerosis) 02/13/2014  . Class 1 obesity 02/18/2013  . Bipolar II disorder ([place]) 10/10/2012  . Current tobacco use 10/25/2011  . Acid reflux 10/25/2011  . CFIDS (chronic fatigue and immune dysfunction syndrome) 10/25/2011  . Abdominal pain 06/25/2011    Past Surgical History  Procedure Laterality Date  . Cesarean section    . Abdominal hysterectomy w/ partial vaginactomy    . Abdominal hysterectomy    . Wrist ganglion excision    . Colonoscopy      Current Outpatient Rx  Name  Route  Sig  Dispense  Refill  . clonazePAM (KLONOPIN) 1 MG tablet   Oral   Take 1 tablet (1 mg total) by mouth 3 (three) times daily.   270 tablet   0   .  cyclobenzaprine (FLEXERIL) 10 MG tablet               . dicyclomine (BENTYL) 10 MG capsule   Oral   Take 10 mg by mouth.         . estrogen-methylTESTOSTERone (ESTRATEST) 1.25-2.5 MG per tablet   Oral   Take 1 tablet by mouth daily.      3   . FLUoxetine (PROZAC) 40 MG capsule   Oral   Take 2 capsules (80 mg total) by mouth daily.   180 capsule   1   . furosemide (LASIX) 20 MG tablet   Oral   Take 1 tablet (20 mg total) by mouth daily.   10 tablet   0   . gabapentin (NEURONTIN) 100 MG capsule               . Loratadine 10 MG CAPS   Oral   Take by mouth.         Marland Kitchen omeprazole (PRILOSEC) 20 MG capsule               . simvastatin (ZOCOR) 10 MG tablet   Oral   Take by mouth.         . traMADol (ULTRAM) 50 MG tablet   Oral   Take 1 tablet (50 mg total) by mouth every 6 (six) hours as needed for moderate pain.   12 tablet  0   . traZODone (DESYREL) 100 MG tablet   Oral   Take 1 tablet (100 mg total) by mouth at bedtime.   90 tablet   1     Allergies Aspirin; Ciprofloxacin; Doxycycline; Erythromycin; Nsaids; Penicillins; and Sulfa antibiotics  Family History  Problem Relation Age of Onset  . Breast cancer Mother 68  . Bipolar disorder Mother   . Depression Mother   . Anxiety disorder Mother   . Heart attack Father   . Thyroid cancer Father   . Hypertension Father   . Depression Sister   . Bipolar disorder Sister   . Hypertension Sister   . Bipolar disorder Sister   . Lymphoma Sister   . Anxiety disorder Sister   . Bipolar disorder Sister     Social History Social History  Substance Use Topics  . Smoking status: Current Every Day Smoker -- 1.00 packs/day    Types: Cigarettes    Start date: 05/04/1977  . Smokeless tobacco: Never Used  . Alcohol Use: No    Review of Systems Constitutional: No fever/chills Eyes: No visual changes. ENT: No sore throat. Cardiovascular: Denies chest pain. Respiratory: Denies shortness of  breath. Gastrointestinal: No abdominal pain.  No nausea, no vomiting.  No diarrhea.  No constipation. Genitourinary: Negative for dysuria. Musculoskeletal: Right knee pain and edema.  Skin: Negative for rash. Neurological: Negative for headaches, focal weakness or numbness. Psychiatric: Anxiety, bipolar, and depression. Endocrine:Hyperlipidemia Hematological/Lymphatic: Allergic/Immunilogical: See medication list  ____________________________________________   PHYSICAL EXAM:  VITAL SIGNS: ED Triage Vitals  Enc Vitals Group     BP 10/19/15 1540 134/87 mmHg     Pulse Rate 10/19/15 1540 102     Resp 10/19/15 1540 18     Temp 10/19/15 1540 98.2 F (36.8 C)     Temp Source 10/19/15 1540 Oral     SpO2 10/19/15 1540 94 %     Weight 10/19/15 1540 230 lb (104.327 kg)     Height 10/19/15 1540 5\' 7"  (1.702 m)     Head Cir --      Peak Flow --      Pain Score 10/19/15 1551 6     Pain Loc --      Pain Edu? --      Excl. in [place]? --     Constitutional: Alert and oriented. Well appearing and in no acute distress. Eyes: Conjunctivae are normal. PERRL. EOMI. Head: Atraumatic. Nose: No congestion/rhinnorhea. Mouth/Throat: Mucous membranes are moist.  Oropharynx non-erythematous. Neck: No stridor.  No cervical spine tenderness to palpation. Hematological/Lymphatic/Immunilogical: No cervical lymphadenopathy. Cardiovascular: Normal rate, regular rhythm. Grossly normal heart sounds.  Good peripheral circulation. Respiratory: Normal respiratory effort.  No retractions. Lungs CTAB. Gastrointestinal: Soft and nontender. No distention. No abdominal bruits. No CVA tenderness. Musculoskeletal: No obvious deformity. Mild joint effusions. Bilateral ankle edema Neurologic:  Normal speech and language. No gross focal neurologic deficits are appreciated. No gait instability. Skin:  Skin is warm, dry and intact. No rash noted. Psychiatric: Mood and affect are normal. Speech and behavior are  normal.  ____________________________________________   LABS (all labs ordered are listed, but only abnormal results are displayed)  Labs Reviewed - No data to display ____________________________________________  EKG   ____________________________________________  RADIOLOGY  No acute findings on x-ray of the right knee ____________________________________________   PROCEDURES  Procedure(s) performed: None  Critical Care performed: No  ____________________________________________   INITIAL IMPRESSION / ASSESSMENT AND PLAN / ED COURSE  Pertinent labs & imaging results  that were available during my care of the patient were reviewed by me and considered in my medical decision making (see chart for details).  Bilateral peripheral edema. Patient given discharge Instructions. Patient given a prescription for Lasix and advised to follow-up with family doctor in 3-5 days. ____________________________________________   FINAL CLINICAL IMPRESSION(S) / ED DIAGNOSES  Final diagnoses:  Bilateral edema of lower extremity      NEW MEDICATIONS STARTED DURING THIS VISIT:  New Prescriptions   FUROSEMIDE (LASIX) 20 MG TABLET    Take 1 tablet (20 mg total) by mouth daily.   TRAMADOL (ULTRAM) 50 MG TABLET    Take 1 tablet (50 mg total) by mouth every 6 (six) hours as needed for moderate pain.     Note:  This document was prepared using Dragon voice recognition software and may include unintentional dictation errors.    Sable Feil, PA-C 10/19/15 1731  Delman Kitten, MD 10/20/15 [phone]

## 2015-10-19 NOTE — Discharge Instructions (Signed)

## 2015-10-19 NOTE — ED Notes (Addendum)
Patient arrives to [facility] ED with complaint of bilateral knee swelling. Patient is also suspicious of right foot swelling. Patient has been seen for it before and has not followed up with a new Ortho. Patient was being seen by Ortho but was unhappy with her care and is now "shoppping for a new orthopedist". Patient is ambulatory in triage

## 2015-10-23 ENCOUNTER — Ambulatory Visit: Payer: Medicare HMO | Admitting: Psychiatry

## 2015-11-03 ENCOUNTER — Ambulatory Visit: Payer: Medicare HMO | Admitting: Psychiatry

## 2015-11-04 NOTE — Telephone Encounter (Signed)
PT HAS APPT FOR 11-06-15 FOR MEDICATION REFILLS.

## 2015-11-06 ENCOUNTER — Ambulatory Visit (INDEPENDENT_AMBULATORY_CARE_PROVIDER_SITE_OTHER): Payer: Medicare HMO | Admitting: Psychiatry

## 2015-11-06 ENCOUNTER — Encounter: Payer: Self-pay | Admitting: Psychiatry

## 2015-11-06 VITALS — BP 122/88 | HR 106 | Temp 97.8°F | Ht 67.0 in | Wt 228.6 lb

## 2015-11-06 DIAGNOSIS — F332 Major depressive disorder, recurrent severe without psychotic features: Secondary | ICD-10-CM

## 2015-11-06 MED ORDER — FLUOXETINE HCL 40 MG PO CAPS: 80.0000 mg | ORAL_CAPSULE | Freq: Every day | ORAL | Status: DC

## 2015-11-06 MED ORDER — TRAZODONE HCL 100 MG PO TABS: 100.0000 mg | ORAL_TABLET | Freq: Every day | ORAL | Status: DC

## 2015-11-06 MED ORDER — CLONAZEPAM 1 MG PO TABS: 1.0000 mg | ORAL_TABLET | Freq: Three times a day (TID) | ORAL | Status: DC

## 2015-11-25 ENCOUNTER — Other Ambulatory Visit: Payer: Self-pay | Admitting: Psychiatry

## 2015-11-26 DIAGNOSIS — R945 Abnormal results of liver function studies: Secondary | ICD-10-CM | POA: Diagnosis not present

## 2015-11-28 DIAGNOSIS — M25562 Pain in left knee: Secondary | ICD-10-CM | POA: Diagnosis not present

## 2015-11-28 DIAGNOSIS — M1711 Unilateral primary osteoarthritis, right knee: Secondary | ICD-10-CM | POA: Diagnosis not present

## 2015-11-28 DIAGNOSIS — G8929 Other chronic pain: Secondary | ICD-10-CM | POA: Diagnosis not present

## 2015-11-28 DIAGNOSIS — M25561 Pain in right knee: Secondary | ICD-10-CM | POA: Diagnosis not present

## 2015-11-28 DIAGNOSIS — M1712 Unilateral primary osteoarthritis, left knee: Secondary | ICD-10-CM | POA: Diagnosis not present

## 2015-11-30 ENCOUNTER — Emergency Department
Admission: EM | Admit: 2015-11-30 | Discharge: 2015-11-30 | Disposition: A | Discharge status: Home / Self Care | Payer: Medicare HMO | Attending: Emergency Medicine | Admitting: Emergency Medicine

## 2015-11-30 ENCOUNTER — Emergency Department: Payer: Medicare HMO

## 2015-11-30 ENCOUNTER — Encounter: Payer: Self-pay | Admitting: Emergency Medicine

## 2015-11-30 DIAGNOSIS — Z79899 Other long term (current) drug therapy: Secondary | ICD-10-CM | POA: Insufficient documentation

## 2015-11-30 DIAGNOSIS — M542 Cervicalgia: Secondary | ICD-10-CM | POA: Diagnosis not present

## 2015-11-30 DIAGNOSIS — F3181 Bipolar II disorder: Secondary | ICD-10-CM | POA: Insufficient documentation

## 2015-11-30 DIAGNOSIS — F17210 Nicotine dependence, cigarettes, uncomplicated: Secondary | ICD-10-CM | POA: Insufficient documentation

## 2015-11-30 DIAGNOSIS — H9201 Otalgia, right ear: Secondary | ICD-10-CM

## 2015-11-30 LAB — CBC WITH DIFFERENTIAL/PLATELET
BASOS ABS: 0.1 10*3/uL (ref 0–0.1)
Basophils Relative: 0 %
Eosinophils Absolute: 0 10*3/uL (ref 0–0.7)
Eosinophils Relative: 0 %
HCT: 48.4 % — ABNORMAL HIGH (ref 35.0–47.0)
HEMOGLOBIN: 15.4 g/dL (ref 12.0–16.0)
LYMPHS ABS: 3.6 10*3/uL (ref 1.0–3.6)
MCH: 26.9 pg (ref 26.0–34.0)
MCHC: 31.8 g/dL — ABNORMAL LOW (ref 32.0–36.0)
MCV: 84.5 fL (ref 80.0–100.0)
Monocytes Absolute: 0.8 10*3/uL (ref 0.2–0.9)
Monocytes Relative: 5 %
Neutro Abs: 12.1 10*3/uL — ABNORMAL HIGH (ref 1.4–6.5)
Platelets: 262 10*3/uL (ref 150–440)
RBC: 5.73 MIL/uL — AB (ref 3.80–5.20)
RDW: 15.6 % — ABNORMAL HIGH (ref 11.5–14.5)
WBC: 16.5 10*3/uL — AB (ref 3.6–11.0)

## 2015-11-30 LAB — BASIC METABOLIC PANEL
ANION GAP: 8 (ref 5–15)
BUN: 8 mg/dL (ref 6–20)
CHLORIDE: 100 mmol/L — AB (ref 101–111)
CO2: 28 mmol/L (ref 22–32)
Calcium: 8.9 mg/dL (ref 8.9–10.3)
Creatinine, Ser: 1.06 mg/dL — ABNORMAL HIGH (ref 0.44–1.00)
GFR calc Af Amer: >60 mL/min (ref >60)
GFR calc non Af Amer: 59 mL/min — ABNORMAL LOW (ref >60)
Glucose, Bld: 125 mg/dL — ABNORMAL HIGH (ref 65–99)
POTASSIUM: 3.7 mmol/L (ref 3.5–5.1)
SODIUM: 136 mmol/L (ref 135–145)

## 2015-11-30 MED ORDER — IOPAMIDOL (ISOVUE-300) INJECTION 61%
75.0000 mL | Freq: Once | INTRAVENOUS | Status: AC | PRN
  Administered 2015-11-30: 75 mL via INTRAVENOUS

## 2015-11-30 NOTE — ED Notes (Signed)
Pt reports tx for right ear infection x 3 weeks, was tx with antibiotics and prednisone, including ear drops.  Pt reports pain spreading into neck and shoulder.   Pt NAD at this time, resp equal and unlabored, skin warm and dry.

## 2015-11-30 NOTE — ED Provider Notes (Signed)
[facility] Emergency Department Provider Note    ____________________________________________  Time seen: ~0405  I have reviewed the triage vital signs and the nursing notes.   HISTORY  Chief Complaint Otalgia   History limited by: Not Limited   HPI Denise Spencer is a 55 y.o. female who presents to the emergency department today for continued and worsening right ear pain. The patient states that she first developed ear pain roughly 3 weeks ago. She saw her primary care doctor at that time. She was put on antibiotics. Initially she was put on azithromycin. When this did not improve the pain the patient was switched to a different antibiotic and given topical eardrops as well. She additionally saw ENT at that time. ENT doctor thought that the ear was improving however the patient's pain did progress. Currently she states the pain starts in her right ear and radiates all the way down to her right shoulder. She states the pain is worse with movement of her head. He denies any fevers with the pain. Denies any blurred vision.   Past Medical History  Diagnosis Date  . Depression   . Anxiety   . Bipolar disorder [facility])     Patient Active Problem List   Diagnosis Date Noted  . Disordered sleep 02/13/2014  . Spasm 02/13/2014  . Imbalance 02/13/2014  . Fatigue 02/13/2014  . Family history of MS (multiple sclerosis) 02/13/2014  . Class 1 obesity 02/18/2013  . Bipolar II disorder ([place]) 10/10/2012  . Current tobacco use 10/25/2011  . Acid reflux 10/25/2011  . CFIDS (chronic fatigue and immune dysfunction syndrome) 10/25/2011  . Abdominal pain 06/25/2011    Past Surgical History  Procedure Laterality Date  . Cesarean section    . Abdominal hysterectomy w/ partial vaginactomy    . Abdominal hysterectomy    . Wrist ganglion excision    . Colonoscopy      Current Outpatient Rx  Name  Route  Sig  Dispense  Refill  . clonazePAM (KLONOPIN) 1 MG tablet    Oral   Take 1 tablet (1 mg total) by mouth 3 (three) times daily.   270 tablet   0   . cyclobenzaprine (FLEXERIL) 10 MG tablet               . estrogen-methylTESTOSTERone (ESTRATEST) 1.25-2.5 MG per tablet   Oral   Take 1 tablet by mouth daily.      3   . FLUoxetine (PROZAC) 40 MG capsule   Oral   Take 2 capsules (80 mg total) by mouth daily.   180 capsule   1   . furosemide (LASIX) 20 MG tablet   Oral   Take 1 tablet (20 mg total) by mouth daily.   10 tablet   0   . gabapentin (NEURONTIN) 100 MG capsule      100 mg 3 (three) times daily.          . Loratadine 10 MG CAPS   Oral   Take 1 capsule by mouth daily.          Marland Kitchen omeprazole (PRILOSEC) 20 MG capsule      20 mg daily.          . traZODone (DESYREL) 100 MG tablet   Oral   Take 1 tablet (100 mg total) by mouth at bedtime.   90 tablet   1   . dicyclomine (BENTYL) 10 MG capsule   Oral   Take 10 mg by mouth.         Marland Kitchen  simvastatin (ZOCOR) 10 MG tablet   Oral   Take 10 mg by mouth daily at 6 PM.          . traMADol (ULTRAM) 50 MG tablet   Oral   Take 1 tablet (50 mg total) by mouth every 6 (six) hours as needed for moderate pain.   12 tablet   0     Allergies Aspirin; Ciprofloxacin; Doxycycline; Erythromycin; Nsaids; Penicillins; and Sulfa antibiotics  Family History  Problem Relation Age of Onset  . Breast cancer Mother 47  . Bipolar disorder Mother   . Depression Mother   . Anxiety disorder Mother   . Heart attack Father   . Thyroid cancer Father   . Hypertension Father   . Depression Sister   . Bipolar disorder Sister   . Hypertension Sister   . Bipolar disorder Sister   . Lymphoma Sister   . Anxiety disorder Sister   . Bipolar disorder Sister     Social History Social History  Substance Use Topics  . Smoking status: Current Every Day Smoker -- 1.00 packs/day    Types: Cigarettes    Start date: 05/04/1977  . Smokeless tobacco: Never Used  . Alcohol Use: No     Review of Systems  Constitutional: Negative for fever. Cardiovascular: Negative for chest pain. Respiratory: Negative for shortness of breath. Gastrointestinal: Negative for abdominal pain, vomiting and diarrhea. Neurological: Negative for headaches, focal weakness or numbness.  10-point ROS otherwise negative.  ____________________________________________   PHYSICAL EXAM:  VITAL SIGNS: ED Triage Vitals  Enc Vitals Group     BP 11/30/15 0018 150/96 mmHg     Pulse Rate 11/30/15 0018 107     Resp 11/30/15 0018 20     Temp 11/30/15 0018 98.2 F (36.8 C)     Temp Source 11/30/15 0018 Oral     SpO2 11/30/15 0018 95 %     Weight 11/30/15 0018 227 lb (102.967 kg)     Height 11/30/15 0018 5\' 7"  (1.702 m)     Head Cir --      Peak Flow --      Pain Score 11/30/15 0019 6   Constitutional: Alert and oriented. Well appearing and in no distress. Eyes: Conjunctivae are normal. PERRL. Normal extraocular movements. ENT   Head: Normocephalic and atraumatic.   Ears: Left wnl. Right external canal with some debris. TM without bulging or obvious fluid.       Nose: No congestion/rhinnorhea.   Mouth/Throat: Mucous membranes are moist.   Neck: No stridor. Tender to palpation on the right side.  Hematological/Lymphatic/Immunilogical: No cervical lymphadenopathy. Cardiovascular: Normal rate, regular rhythm.  No murmurs, rubs, or gallops. Respiratory: Normal respiratory effort without tachypnea nor retractions. Breath sounds are clear and equal bilaterally. No wheezes/rales/rhonchi. Gastrointestinal: Soft and nontender. No distention. There is no CVA tenderness. Genitourinary: Deferred Musculoskeletal: Normal range of motion in all extremities. No joint effusions.  No lower extremity tenderness nor edema. Neurologic:  Normal speech and language. No gross focal neurologic deficits are appreciated.  Skin:  Skin is warm, dry and intact. No rash noted. Psychiatric: Mood and affect  are normal. Speech and behavior are normal. Patient exhibits appropriate insight and judgment.  ____________________________________________    LABS (pertinent positives/negatives)  Labs Reviewed  CBC WITH DIFFERENTIAL/PLATELET - Abnormal; Notable for the following:    WBC 16.5 (*)    RBC 5.73 (*)    HCT 48.4 (*)    MCHC 31.8 (*)    RDW 15.6 (*)  Neutro Abs 12.1 (*)    All other components within normal limits  BASIC METABOLIC PANEL - Abnormal; Notable for the following:    Chloride 100 (*)    Glucose, Bld 125 (*)    Creatinine, Ser 1.06 (*)    GFR calc non Af Amer 59 (*)    All other components within normal limits     ____________________________________________   EKG  None  ____________________________________________    RADIOLOGY  CT neck IMPRESSION: Negative contrast-enhanced CT neck.  ____________________________________________   PROCEDURES  Procedure(s) performed: None  Critical Care performed: No  ____________________________________________   INITIAL IMPRESSION / ASSESSMENT AND PLAN / ED COURSE  Pertinent labs & imaging results that were available during my care of the patient were reviewed by me and considered in my medical decision making (see chart for details).  Patient presented to the emergency department today because of concerns for worsening and progressing right ear pain now with radiation. Given that the patient has had worsening ear pain after multiple antibiotics will check a CT of the soft tissues to rule out deep abscess or deep tissue infection.  ----------------------------------------- 6:28 AM on 11/30/2015 -----------------------------------------  CT of the neck without any concerning findings. She does have a leukocytosis however again no findings concerning for deep space infection. Will have patient follow-up with ENT.  ____________________________________________   FINAL CLINICAL IMPRESSION(S) / ED  DIAGNOSES  Final diagnoses:  Otalgia, right     Note: This dictation was prepared with Dragon dictation. Any transcriptional errors that result from this process are unintentional    Nance Pear, MD 11/30/15 [phone]

## 2015-11-30 NOTE — Discharge Instructions (Signed)
Please seek medical attention for any high fevers, chest pain, shortness of breath, change in behavior, persistent vomiting, bloody stool or any other new or concerning symptoms.   Earache An earache, also called otalgia, can be caused by many things. Pain from an earache can be sharp, dull, or burning. The pain may be temporary or constant. Earaches can be caused by problems with the ear, such as infection in either the middle ear or the ear canal, injury, impacted ear wax, middle ear pressure, or a foreign body in the ear. Ear pain can also result from problems in other areas. This is called referred pain. For example, pain can come from a sore throat, a tooth infection, or problems with the jaw or the joint between the jaw and the skull (temporomandibular joint, or TMJ). The cause of an earache is not always easy to identify. Watchful waiting may be appropriate for some earaches until a clear cause of the pain can be found. HOME CARE INSTRUCTIONS Watch your condition for any changes. The following actions may help to lessen any discomfort that you are feeling:  Take medicines only as directed by your health care provider. This includes ear drops.  Apply ice to your outer ear to help reduce pain.  Put ice in a plastic bag.  Place a towel between your skin and the bag.  Leave the ice on for 20 minutes, 2-3 times per day.  Do not put anything in your ear other than medicine that is prescribed by your health care provider.  Try resting in an upright position instead of lying down. This may help to reduce pressure in the middle ear and relieve pain.  Chew gum if it helps to relieve your ear pain.  Control any allergies that you have.  Keep all follow-up visits as directed by your health care provider. This is important. SEEK MEDICAL CARE IF:  Your pain does not improve within 2 days.  You have a fever.  You have new or worsening symptoms. SEEK IMMEDIATE MEDICAL CARE IF:  You have  a severe headache.  You have a stiff neck.  You have difficulty swallowing.  You have redness or swelling behind your ear.  You have drainage from your ear.  You have hearing loss.  You feel dizzy.   This information is not intended to replace advice given to you by your health care provider. Make sure you discuss any questions you have with your health care provider.   Document Released: 01/23/2004 Document Revised: 06/28/2014 Document Reviewed: 01/06/2014 Elsevier Interactive Patient Education [employer].

## 2015-11-30 NOTE — ED Notes (Signed)
Patient reports she has been treated for approximately 3 weeks for ear infection with antibiotics and prednisone.  Patient reports continued pain in right ear and now going down right side of her neck.

## 2015-12-02 ENCOUNTER — Encounter: Payer: Self-pay | Admitting: Obstetrics and Gynecology

## 2015-12-03 DIAGNOSIS — H66009 Acute suppurative otitis media without spontaneous rupture of ear drum, unspecified ear: Secondary | ICD-10-CM | POA: Diagnosis not present

## 2015-12-03 DIAGNOSIS — H6010 Cellulitis of external ear, unspecified ear: Secondary | ICD-10-CM | POA: Diagnosis not present

## 2015-12-03 DIAGNOSIS — H902 Conductive hearing loss, unspecified: Secondary | ICD-10-CM | POA: Diagnosis not present

## 2015-12-10 ENCOUNTER — Telehealth: Payer: Self-pay | Admitting: Psychiatry

## 2015-12-17 NOTE — Telephone Encounter (Signed)
All this was dealt with at her last appointment.

## 2015-12-17 NOTE — Telephone Encounter (Signed)
I will make a note of it and see if I can get one completed for her.

## 2015-12-22 DIAGNOSIS — Z79899 Other long term (current) drug therapy: Secondary | ICD-10-CM | POA: Diagnosis not present

## 2015-12-22 DIAGNOSIS — F3181 Bipolar II disorder: Secondary | ICD-10-CM | POA: Diagnosis not present

## 2015-12-22 DIAGNOSIS — E7800 Pure hypercholesterolemia, unspecified: Secondary | ICD-10-CM | POA: Diagnosis not present

## 2015-12-22 DIAGNOSIS — R7309 Other abnormal glucose: Secondary | ICD-10-CM | POA: Diagnosis not present

## 2015-12-22 DIAGNOSIS — K219 Gastro-esophageal reflux disease without esophagitis: Secondary | ICD-10-CM | POA: Diagnosis not present

## 2015-12-22 DIAGNOSIS — R5383 Other fatigue: Secondary | ICD-10-CM | POA: Diagnosis not present

## 2015-12-22 DIAGNOSIS — J301 Allergic rhinitis due to pollen: Secondary | ICD-10-CM | POA: Diagnosis not present

## 2015-12-22 DIAGNOSIS — R748 Abnormal levels of other serum enzymes: Secondary | ICD-10-CM | POA: Diagnosis not present

## 2015-12-24 DIAGNOSIS — M170 Bilateral primary osteoarthritis of knee: Secondary | ICD-10-CM | POA: Diagnosis not present

## 2015-12-26 ENCOUNTER — Other Ambulatory Visit: Payer: Self-pay | Admitting: Psychiatry

## 2016-01-15 DIAGNOSIS — H903 Sensorineural hearing loss, bilateral: Secondary | ICD-10-CM | POA: Diagnosis not present

## 2016-01-15 DIAGNOSIS — H6060 Unspecified chronic otitis externa, unspecified ear: Secondary | ICD-10-CM | POA: Diagnosis not present

## 2016-02-04 DIAGNOSIS — M1711 Unilateral primary osteoarthritis, right knee: Secondary | ICD-10-CM | POA: Diagnosis not present

## 2016-02-04 DIAGNOSIS — G8929 Other chronic pain: Secondary | ICD-10-CM | POA: Diagnosis not present

## 2016-02-04 DIAGNOSIS — M25561 Pain in right knee: Secondary | ICD-10-CM | POA: Diagnosis not present

## 2016-02-10 ENCOUNTER — Other Ambulatory Visit: Payer: Self-pay | Admitting: Orthopedic Surgery

## 2016-02-10 DIAGNOSIS — M1711 Unilateral primary osteoarthritis, right knee: Secondary | ICD-10-CM

## 2016-02-10 DIAGNOSIS — M25561 Pain in right knee: Secondary | ICD-10-CM

## 2016-03-08 ENCOUNTER — Ambulatory Visit (INDEPENDENT_AMBULATORY_CARE_PROVIDER_SITE_OTHER): Payer: Medicare HMO | Admitting: Psychiatry

## 2016-03-08 ENCOUNTER — Encounter: Payer: Self-pay | Admitting: Psychiatry

## 2016-03-08 VITALS — Temp 98.9°F | Wt 225.6 lb

## 2016-03-08 DIAGNOSIS — F332 Major depressive disorder, recurrent severe without psychotic features: Secondary | ICD-10-CM

## 2016-03-08 MED ORDER — FLUOXETINE HCL 40 MG PO CAPS: 80.0000 mg | ORAL_CAPSULE | Freq: Every day | ORAL | 1 refills | Status: DC

## 2016-03-08 MED ORDER — TRAZODONE HCL 100 MG PO TABS: 100.0000 mg | ORAL_TABLET | Freq: Every day | ORAL | 1 refills | Status: DC

## 2016-03-08 MED ORDER — CLONAZEPAM 1 MG PO TABS: 1.0000 mg | ORAL_TABLET | Freq: Three times a day (TID) | ORAL | 1 refills | Status: DC

## 2016-03-08 NOTE — Progress Notes (Signed)
Follow-up 55 year old woman with a history of depression. Mood is improved. No symptoms of depression and months. Affect euthymic. No major anxiety. She is having some chronic pain that she is getting worked up. Continues to have the stress of dealing with her daughter in the house but feels she is getting that taking care of 2. Patient is neatly groomed and dressed. On time. Good eye contact. Normal psychomotor activity. Speech normal rate tone and volume. Thoughts lucid.  Continue all current medication. Put in for 90 day prescription with refills. Patient knows she can get in touch sooner if needed.

## 2016-03-15 ENCOUNTER — Ambulatory Visit
Admission: RE | Admit: 2016-03-15 | Discharge: 2016-03-15 | Disposition: A | Discharge status: Home / Self Care | Payer: Commercial Managed Care - HMO | Source: Ambulatory Visit | Attending: Orthopedic Surgery | Admitting: Orthopedic Surgery

## 2016-03-15 DIAGNOSIS — M25561 Pain in right knee: Secondary | ICD-10-CM | POA: Insufficient documentation

## 2016-03-15 DIAGNOSIS — M25562 Pain in left knee: Secondary | ICD-10-CM | POA: Diagnosis not present

## 2016-03-15 DIAGNOSIS — M1711 Unilateral primary osteoarthritis, right knee: Secondary | ICD-10-CM | POA: Diagnosis not present

## 2016-03-22 DIAGNOSIS — M76899 Other specified enthesopathies of unspecified lower limb, excluding foot: Secondary | ICD-10-CM | POA: Diagnosis not present

## 2016-03-22 DIAGNOSIS — M25561 Pain in right knee: Secondary | ICD-10-CM | POA: Diagnosis not present

## 2016-03-22 DIAGNOSIS — G8929 Other chronic pain: Secondary | ICD-10-CM | POA: Diagnosis not present

## 2016-03-26 DIAGNOSIS — M25561 Pain in right knee: Secondary | ICD-10-CM | POA: Diagnosis not present

## 2016-03-26 DIAGNOSIS — M5442 Lumbago with sciatica, left side: Secondary | ICD-10-CM | POA: Diagnosis not present

## 2016-03-26 DIAGNOSIS — G8929 Other chronic pain: Secondary | ICD-10-CM | POA: Diagnosis not present

## 2016-03-26 DIAGNOSIS — M25562 Pain in left knee: Secondary | ICD-10-CM | POA: Diagnosis not present

## 2016-03-31 ENCOUNTER — Other Ambulatory Visit: Payer: Self-pay | Admitting: Sports Medicine

## 2016-03-31 DIAGNOSIS — M545 Low back pain: Secondary | ICD-10-CM | POA: Diagnosis not present

## 2016-03-31 DIAGNOSIS — M25552 Pain in left hip: Secondary | ICD-10-CM | POA: Diagnosis not present

## 2016-03-31 DIAGNOSIS — M5416 Radiculopathy, lumbar region: Secondary | ICD-10-CM

## 2016-03-31 DIAGNOSIS — M25551 Pain in right hip: Secondary | ICD-10-CM | POA: Diagnosis not present

## 2016-03-31 DIAGNOSIS — M170 Bilateral primary osteoarthritis of knee: Secondary | ICD-10-CM | POA: Diagnosis not present

## 2016-04-09 ENCOUNTER — Ambulatory Visit: Admission: RE | Admit: 2016-04-09 | Payer: Medicare HMO | Source: Ambulatory Visit

## 2016-04-13 DIAGNOSIS — Z79899 Other long term (current) drug therapy: Secondary | ICD-10-CM | POA: Diagnosis not present

## 2016-04-13 DIAGNOSIS — E7800 Pure hypercholesterolemia, unspecified: Secondary | ICD-10-CM | POA: Diagnosis not present

## 2016-04-16 DIAGNOSIS — E7800 Pure hypercholesterolemia, unspecified: Secondary | ICD-10-CM | POA: Diagnosis not present

## 2016-04-16 DIAGNOSIS — R748 Abnormal levels of other serum enzymes: Secondary | ICD-10-CM | POA: Diagnosis not present

## 2016-04-16 DIAGNOSIS — G894 Chronic pain syndrome: Secondary | ICD-10-CM | POA: Diagnosis not present

## 2016-04-16 DIAGNOSIS — Z79899 Other long term (current) drug therapy: Secondary | ICD-10-CM | POA: Diagnosis not present

## 2016-04-22 ENCOUNTER — Ambulatory Visit
Admission: RE | Admit: 2016-04-22 | Discharge: 2016-04-22 | Disposition: A | Discharge status: Home / Self Care | Payer: Commercial Managed Care - HMO | Source: Ambulatory Visit | Attending: Sports Medicine | Admitting: Sports Medicine

## 2016-04-22 DIAGNOSIS — M5416 Radiculopathy, lumbar region: Secondary | ICD-10-CM | POA: Diagnosis not present

## 2016-04-22 DIAGNOSIS — M2578 Osteophyte, vertebrae: Secondary | ICD-10-CM | POA: Diagnosis not present

## 2016-04-22 DIAGNOSIS — M5440 Lumbago with sciatica, unspecified side: Secondary | ICD-10-CM | POA: Insufficient documentation

## 2016-04-22 DIAGNOSIS — M5116 Intervertebral disc disorders with radiculopathy, lumbar region: Secondary | ICD-10-CM | POA: Diagnosis not present

## 2016-04-22 DIAGNOSIS — M5127 Other intervertebral disc displacement, lumbosacral region: Secondary | ICD-10-CM | POA: Diagnosis not present

## 2016-04-22 DIAGNOSIS — M545 Low back pain: Secondary | ICD-10-CM

## 2016-04-28 DIAGNOSIS — M5106 Intervertebral disc disorders with myelopathy, lumbar region: Secondary | ICD-10-CM | POA: Diagnosis not present

## 2016-05-03 ENCOUNTER — Telehealth: Payer: Self-pay

## 2016-05-03 NOTE — Telephone Encounter (Signed)
Medication problem - Fax received from [facility] to question if patient should be taking Fluoxetine 40mg  by Dr. Weber Cooks or Duloxetine 60mg  by Dr. Baldemar Lenis.  Requests clarificaton.  Faxes left for Dr. Weber Cooks to review for follow up.

## 2016-05-04 NOTE — Telephone Encounter (Signed)
Both

## 2016-05-05 DIAGNOSIS — M5106 Intervertebral disc disorders with myelopathy, lumbar region: Secondary | ICD-10-CM | POA: Diagnosis not present

## 2016-05-05 NOTE — Telephone Encounter (Signed)
Medication management - Telephone call with Justice Deeds, pharmacist at [facility] to inform per Dr. Weber Cooks patient should be taking Fluoxetine 40 mg and Duloxetine 60 mg as he reviewed the form that was faxed to him regarding concern 05/03/16.

## 2016-05-10 DIAGNOSIS — M5116 Intervertebral disc disorders with radiculopathy, lumbar region: Secondary | ICD-10-CM | POA: Diagnosis not present

## 2016-05-10 DIAGNOSIS — M5106 Intervertebral disc disorders with myelopathy, lumbar region: Secondary | ICD-10-CM | POA: Diagnosis not present

## 2016-05-24 DIAGNOSIS — M545 Low back pain: Secondary | ICD-10-CM | POA: Diagnosis not present

## 2016-05-24 DIAGNOSIS — G8929 Other chronic pain: Secondary | ICD-10-CM | POA: Diagnosis not present

## 2016-05-31 DIAGNOSIS — M5106 Intervertebral disc disorders with myelopathy, lumbar region: Secondary | ICD-10-CM | POA: Diagnosis not present

## 2016-05-31 DIAGNOSIS — M5116 Intervertebral disc disorders with radiculopathy, lumbar region: Secondary | ICD-10-CM | POA: Diagnosis not present

## 2016-06-05 NOTE — Progress Notes (Signed)
Follow-up patient with chronic depression. Patient's mood is currently good. Not feeling overly depressed. Sleeps adequately at night. Pain under good control.  Neatly dressed and groomed. Good eye contact. Normal speech. Affect euthymic. No suicidal ideation no evidence of delusions.  Review medication plan. Refill medicines and follow-up in another 3-4 months.

## 2016-06-07 DIAGNOSIS — M5116 Intervertebral disc disorders with radiculopathy, lumbar region: Secondary | ICD-10-CM | POA: Diagnosis not present

## 2016-06-07 DIAGNOSIS — M5106 Intervertebral disc disorders with myelopathy, lumbar region: Secondary | ICD-10-CM | POA: Diagnosis not present

## 2016-06-25 DIAGNOSIS — R52 Pain, unspecified: Secondary | ICD-10-CM | POA: Diagnosis not present

## 2016-06-25 DIAGNOSIS — R05 Cough: Secondary | ICD-10-CM | POA: Diagnosis not present

## 2016-06-25 DIAGNOSIS — R0602 Shortness of breath: Secondary | ICD-10-CM | POA: Diagnosis not present

## 2016-06-28 ENCOUNTER — Telehealth: Payer: Self-pay

## 2016-06-28 NOTE — Telephone Encounter (Signed)
left message that she needed to contact [facility] that a rx was sent in 03-08-16 and that she should have enough refill until march.

## 2016-06-28 NOTE — Telephone Encounter (Signed)
pt husband called states that there insurance has changed and they do not use [facility] anymore rx needs to be called into [facility] on [address]

## 2016-06-28 NOTE — Telephone Encounter (Signed)
pt called left message that she needed a refill on her medications.

## 2016-06-28 NOTE — Telephone Encounter (Signed)
called in rx for patient 90 day supply with no additional refills.

## 2016-07-01 ENCOUNTER — Encounter: Payer: Self-pay | Admitting: Emergency Medicine

## 2016-07-01 ENCOUNTER — Emergency Department: Payer: PPO

## 2016-07-01 ENCOUNTER — Emergency Department
Admission: EM | Admit: 2016-07-01 | Discharge: 2016-07-01 | Disposition: A | Discharge status: Home / Self Care | Payer: PPO | Attending: Emergency Medicine | Admitting: Emergency Medicine

## 2016-07-01 DIAGNOSIS — R0602 Shortness of breath: Secondary | ICD-10-CM | POA: Insufficient documentation

## 2016-07-01 DIAGNOSIS — J441 Chronic obstructive pulmonary disease with (acute) exacerbation: Secondary | ICD-10-CM

## 2016-07-01 DIAGNOSIS — R05 Cough: Secondary | ICD-10-CM | POA: Diagnosis not present

## 2016-07-01 DIAGNOSIS — F17210 Nicotine dependence, cigarettes, uncomplicated: Secondary | ICD-10-CM | POA: Insufficient documentation

## 2016-07-01 LAB — CBC WITH DIFFERENTIAL/PLATELET
Basophils Absolute: 0 10*3/uL (ref 0–0.1)
Basophils Relative: 0 %
EOS ABS: 0 10*3/uL (ref 0–0.7)
Eosinophils Relative: 0 %
HEMATOCRIT: 45.1 % (ref 35.0–47.0)
Hemoglobin: 15.2 g/dL (ref 12.0–16.0)
LYMPHS ABS: 2.5 10*3/uL (ref 1.0–3.6)
Lymphocytes Relative: 18 %
MCH: 28.1 pg (ref 26.0–34.0)
MCHC: 33.7 g/dL (ref 32.0–36.0)
MCV: 83.5 fL (ref 80.0–100.0)
MONO ABS: 0.8 10*3/uL (ref 0.2–0.9)
MONOS PCT: 6 %
NEUTROS PCT: 76 %
Neutro Abs: 10.9 10*3/uL — ABNORMAL HIGH (ref 1.4–6.5)
Platelets: 443 10*3/uL — ABNORMAL HIGH (ref 150–440)
RBC: 5.4 MIL/uL — ABNORMAL HIGH (ref 3.80–5.20)
RDW: 15.1 % — AB (ref 11.5–14.5)
WBC: 14.3 10*3/uL — ABNORMAL HIGH (ref 3.6–11.0)

## 2016-07-01 LAB — BASIC METABOLIC PANEL
Anion gap: 10 (ref 5–15)
BUN: 6 mg/dL (ref 6–20)
CHLORIDE: 97 mmol/L — AB (ref 101–111)
CO2: 29 mmol/L (ref 22–32)
CREATININE: 0.98 mg/dL (ref 0.44–1.00)
Calcium: 9.2 mg/dL (ref 8.9–10.3)
GFR calc Af Amer: >60 mL/min (ref >60)
GFR calc non Af Amer: >60 mL/min (ref >60)
GLUCOSE: 152 mg/dL — AB (ref 65–99)
Potassium: 3.5 mmol/L (ref 3.5–5.1)
Sodium: 136 mmol/L (ref 135–145)

## 2016-07-01 LAB — POCT RAPID STREP A: Streptococcus, Group A Screen (Direct): NEGATIVE

## 2016-07-01 LAB — INFLUENZA PANEL BY PCR (TYPE A & B)
Influenza A By PCR: NEGATIVE
Influenza B By PCR: NEGATIVE

## 2016-07-01 LAB — TROPONIN I: Troponin I: <0.03 ng/mL (ref <0.03)

## 2016-07-01 MED ORDER — ALBUTEROL SULFATE HFA 108 (90 BASE) MCG/ACT IN AERS: 2.0000 | INHALATION_SPRAY | Freq: Four times a day (QID) | RESPIRATORY_TRACT | 2 refills | Status: DC | PRN

## 2016-07-01 MED ORDER — HYDROCOD POLST-CPM POLST ER 10-8 MG/5ML PO SUER
5.0000 mL | Freq: Once | ORAL | Status: AC
  Administered 2016-07-01: 5 mL via ORAL
  Filled 2016-07-01: qty 5

## 2016-07-01 MED ORDER — IPRATROPIUM-ALBUTEROL 0.5-2.5 (3) MG/3ML IN SOLN
3.0000 mL | Freq: Once | RESPIRATORY_TRACT | Status: AC
  Administered 2016-07-01: 3 mL via RESPIRATORY_TRACT
  Filled 2016-07-01: qty 3

## 2016-07-01 MED ORDER — ALBUTEROL SULFATE (2.5 MG/3ML) 0.083% IN NEBU
5.0000 mg | INHALATION_SOLUTION | Freq: Once | RESPIRATORY_TRACT | Status: AC
  Administered 2016-07-01: 5 mg via RESPIRATORY_TRACT
  Filled 2016-07-01: qty 6

## 2016-07-01 MED ORDER — GUAIFENESIN-CODEINE 100-10 MG/5ML PO SOLN: 5.0000 mL | Freq: Four times a day (QID) | ORAL | 0 refills | Status: DC | PRN

## 2016-07-01 NOTE — ED Notes (Signed)
Pt alert and oriented X4, active, cooperative, pt in NAD. RR even and unlabored, color WNL.  Pt informed to return if any life threatening symptoms occur.   

## 2016-07-01 NOTE — ED Provider Notes (Signed)
[facility] Emergency Department Provider Note  Time seen: 4:45 PM  I have reviewed the triage vital signs and the nursing notes.   HISTORY  Chief Complaint Shortness of Breath and URI    HPI Denise Spencer is a 56 y.o. female with a past medical history of anxiety, bipolar, presents the emergency department with difficulty breathing and cough. According to the patient on January 2 she became sick with cough, congestion fever and body aches. Patient was seen by her doctor approximately 6 days ago started on prednisone taper and cough medication. Patient states the fever and congestion have subsided but she continues to be short of breath with cough. Patient denies any history of asthma or COPD but does smoke cigarettes. Currently describes her shortness breath is moderate cough as moderate.  Past Medical History:  Diagnosis Date  . Anxiety   . Bipolar disorder ([place])   . Depression     Patient Active Problem List   Diagnosis Date Noted  . Disordered sleep 02/13/2014  . Spasm 02/13/2014  . Imbalance 02/13/2014  . Fatigue 02/13/2014  . Family history of MS (multiple sclerosis) 02/13/2014  . Class 1 obesity 02/18/2013  . Bipolar II disorder ([place]) 10/10/2012  . Current tobacco use 10/25/2011  . Acid reflux 10/25/2011  . CFIDS (chronic fatigue and immune dysfunction syndrome) ([place]) 10/25/2011  . Abdominal pain 06/25/2011    Past Surgical History:  Procedure Laterality Date  . ABDOMINAL HYSTERECTOMY    . ABDOMINAL HYSTERECTOMY W/ PARTIAL VAGINACTOMY    . CESAREAN SECTION    . COLONOSCOPY    . WRIST GANGLION EXCISION      Prior to Admission medications   Medication Sig Start Date End Date Taking? Authorizing Provider  clonazePAM (KLONOPIN) 1 MG tablet Take 1 tablet (1 mg total) by mouth 3 (three) times daily. 03/08/16   Gonzella Lex, MD  cyclobenzaprine (FLEXERIL) 10 MG tablet  12/06/14   Historical Provider, MD  dicyclomine (BENTYL) 10 MG capsule  Take 10 mg by mouth. 01/29/13   Historical Provider, MD  estrogen-methylTESTOSTERone (ESTRATEST) 1.25-2.5 MG per tablet Take 1 tablet by mouth daily. 12/19/14   Historical Provider, MD  FLUoxetine (PROZAC) 40 MG capsule Take 2 capsules (80 mg total) by mouth daily. 03/08/16 09/04/16  Gonzella Lex, MD  furosemide (LASIX) 20 MG tablet Take 1 tablet (20 mg total) by mouth daily. 10/19/15 10/18/16  Sable Feil, PA-C  gabapentin (NEURONTIN) 100 MG capsule 100 mg 3 (three) times daily.  11/30/14   Historical Provider, MD  Loratadine 10 MG CAPS Take 1 capsule by mouth daily.     Historical Provider, MD  omeprazole (PRILOSEC) 20 MG capsule 20 mg daily.  11/30/14   Historical Provider, MD  simvastatin (ZOCOR) 10 MG tablet Take 10 mg by mouth daily at 6 PM.  01/17/14   Historical Provider, MD  traMADol (ULTRAM) 50 MG tablet Take 1 tablet (50 mg total) by mouth every 6 (six) hours as needed for moderate pain. 10/19/15   Sable Feil, PA-C  traZODone (DESYREL) 100 MG tablet Take 1 tablet (100 mg total) by mouth at bedtime. 03/08/16   Gonzella Lex, MD    Allergies  Allergen Reactions  . Aspirin   . Ciprofloxacin     Other reaction(s): RASH  . Doxycycline   . Erythromycin   . Nsaids   . Penicillins   . Sulfa Antibiotics Hives    Other reaction(s): RASH    Family History  Problem Relation Age of Onset  . Breast cancer Mother 30  . Bipolar disorder Mother   . Depression Mother   . Anxiety disorder Mother   . Heart attack Father   . Thyroid cancer Father   . Hypertension Father   . Depression Sister   . Bipolar disorder Sister   . Hypertension Sister   . Bipolar disorder Sister   . Lymphoma Sister   . Anxiety disorder Sister   . Bipolar disorder Sister     Social History Social History  Substance Use Topics  . Smoking status: Current Every Day Smoker    Packs/day: 1.00    Types: Cigarettes    Start date: 05/04/1977  . Smokeless tobacco: Never Used  . Alcohol use No    Review of  Systems Constitutional: Negative for fever. Cardiovascular: Negative for chest pain. Respiratory: Positive for shortness breath. Positive for cough. Gastrointestinal: Negative for abdominal pain Musculoskeletal: Mild right-sided back pain, which she relates to coughing. Neurological: Negative for headache 10-point ROS otherwise negative.  ____________________________________________   PHYSICAL EXAM:  VITAL SIGNS: ED Triage Vitals  Enc Vitals Group     BP 07/01/16 1607 122/75     Pulse Rate 07/01/16 1607 (!) 108     Resp 07/01/16 1607 18     Temp 07/01/16 1607 97.5 F (36.4 C)     Temp Source 07/01/16 1607 Oral     SpO2 07/01/16 1607 100 %     Weight 07/01/16 1607 218 lb (98.9 kg)     Height 07/01/16 1607 5\' 7"  (1.702 m)     Head Circumference --      Peak Flow --      Pain Score 07/01/16 1612 7     Pain Loc --      Pain Edu? --      Excl. in [place]? --     Constitutional: Alert and oriented. Well appearing and in no distress. Eyes: Normal exam ENT   Head: Normocephalic and atraumatic.   Mouth/Throat: Mucous membranes are moist. Cardiovascular: Normal rate, regular rhythm. No murmur Respiratory: Mild tachypnea. Mild to moderate expiratory wheeze bilaterally. No rales or rhonchi. Gastrointestinal: Soft and nontender. No distention.   Musculoskeletal: Nontender with normal range of motion in all extremities. Neurologic:  Normal speech and language. No gross focal neurologic deficits  Skin:  Skin is warm, dry and intact.  Psychiatric: Mood and affect are normal.  ____________________________________________    EKG  EKG reviewed and interpreted by myself shows sinus tachycardia 101 bpm. Narrow QRS, normal axis, normal intervals, nonspecific but no concerning ST changes.  ____________________________________________    RADIOLOGY  Chest x-ray is clear  ____________________________________________   INITIAL IMPRESSION / ASSESSMENT AND PLAN / ED  COURSE  Pertinent labs & imaging results that were available during my care of the patient were reviewed by me and considered in my medical decision making (see chart for details).  Patient presents the emergency department with continued cough and shortness of breath. Patient has moderate expiratory wheeze on exam. She is currently on a prednisone taper, we will treat with DuoNeb's. We will check labs, influenza screen and chest x-ray.  Patient's chest x-ray EKG are largely within normal limits, labs pending.  Labs are largely within normal limits besides a slight leukocytosis however the patient is currently on a prednisone taper. Strep test is negative. Influenza test is negative. Patient's wheezes improved after breathing treatments. Highly suspect undiagnosed COPD exacerbation. Patient is currently on prednisone we will discharge  with albuterol and cough medication. The patient will follow up with her primary care doctor as well as pulmonology. I discussed smoking cessation with the patient.  ____________________________________________   FINAL CLINICAL IMPRESSION(S) / ED DIAGNOSES  Dyspnea cough COPD   Harvest Dark, MD 07/01/16 [phone]

## 2016-07-01 NOTE — ED Triage Notes (Signed)
Patient reports seeing PCP last Friday, was given Prednisone and cough suppressor. Patient states since taking, does not feel better. Reports shortness of breath and cough still (had fever, sore throat previously). +Intermittent cough. States she has 6 days left of Prednisone (10mg  pills). Patient reports granddaughter tested positive for flu.

## 2016-07-01 NOTE — ED Notes (Signed)
Pt resting in bed, family at bedside, pt states not feeling any better after breathing txs

## 2016-07-12 DIAGNOSIS — N649 Disorder of breast, unspecified: Secondary | ICD-10-CM | POA: Diagnosis not present

## 2016-07-29 DIAGNOSIS — J342 Deviated nasal septum: Secondary | ICD-10-CM | POA: Diagnosis not present

## 2016-07-29 DIAGNOSIS — J329 Chronic sinusitis, unspecified: Secondary | ICD-10-CM | POA: Diagnosis not present

## 2016-08-12 DIAGNOSIS — E7800 Pure hypercholesterolemia, unspecified: Secondary | ICD-10-CM | POA: Diagnosis not present

## 2016-08-12 DIAGNOSIS — Z79899 Other long term (current) drug therapy: Secondary | ICD-10-CM | POA: Diagnosis not present

## 2016-08-16 ENCOUNTER — Ambulatory Visit (INDEPENDENT_AMBULATORY_CARE_PROVIDER_SITE_OTHER): Payer: PPO | Admitting: Pulmonary Disease

## 2016-08-16 ENCOUNTER — Encounter: Payer: Self-pay | Admitting: Pulmonary Disease

## 2016-08-16 VITALS — BP 132/76 | HR 107 | Ht 67.0 in | Wt 224.8 lb

## 2016-08-16 DIAGNOSIS — R0609 Other forms of dyspnea: Secondary | ICD-10-CM

## 2016-08-16 DIAGNOSIS — G4719 Other hypersomnia: Secondary | ICD-10-CM | POA: Diagnosis not present

## 2016-08-16 DIAGNOSIS — Z87891 Personal history of nicotine dependence: Secondary | ICD-10-CM | POA: Diagnosis not present

## 2016-08-16 DIAGNOSIS — R079 Chest pain, unspecified: Secondary | ICD-10-CM

## 2016-08-16 DIAGNOSIS — E669 Obesity, unspecified: Secondary | ICD-10-CM | POA: Diagnosis not present

## 2016-08-16 MED ORDER — FLUTICASONE FUROATE-VILANTEROL 100-25 MCG/INH IN AEPB
1.0000 | INHALATION_SPRAY | Freq: Every day | RESPIRATORY_TRACT | 10 refills | Status: DC
Start: 2016-08-16 — End: 2016-09-16

## 2016-08-16 MED ORDER — FLUTICASONE FUROATE-VILANTEROL 100-25 MCG/INH IN AEPB: 1.0000 | INHALATION_SPRAY | Freq: Every day | RESPIRATORY_TRACT | 0 refills | Status: AC

## 2016-08-16 NOTE — Patient Instructions (Addendum)
1) Trial of Breo inhaler - one inhalation daily in the morning.Rinse mouth after use 2) To investigate daytime sleepiness, a home sleep study has been ordered 3) I am concerned about the chest pain and suggest that Dr Ok Anis consider further evaluation, perhaps with a stress test 4) Follow up in 4-6 weeks with full lung function tests prior to that visit

## 2016-08-16 NOTE — Progress Notes (Signed)
PULMONARY CONSULT NOTE  Requesting MD/Service: Beryle Quant Date of initial consultation: 08/16/16 Reason for consultation: cough, dyspnea  PT PROFILE: 56 y.o. former smoker (quit 06/25/16) referred for evaluation of cough and dyspnea  HPI:  As above. She initially presented to Dr Lynett Fish PA 06/25/16 with complaints of cough and DOE. A CXR was done on that visit and a report indicates that it was normal. On 01/18 she presented to the ED with severe SOB and was treated with albuterol nebs with improvement. She was prescribed a cough suppressant, course of prednisone and albuterol MDI. She states that "it took awhile" but she is now better and nearing baseline. At this baseline, she is limited by dyspnea and fatigue. This has been present for years. She also reports SSCP that often occurs with exetion and sometimes @ rest. The pain is described as an ache and radiates through to her back. She underwent a cardiac stress test in 2013 for similar (but not the exact same) symptoms @ [facility]. She has minimal NP cough now. Denies CP, fever, purulent sputum, hemoptysis, LE edema and calf tenderness. She has not used albuterol MDI in greater than a week.   Almost as an afterthought, she reports (and her husband confirms) heavy snoring. She often sleeps up to 12 hrs a night but does not feel rested upon awakening and sometimes has AM HA. She often takes afternoon naps. She sometimes awakens with choking sensation and sense of SOB.   Past Medical History:  Diagnosis Date  . Anxiety   . Bipolar disorder ([place])   . Depression     Past Surgical History:  Procedure Laterality Date  . ABDOMINAL HYSTERECTOMY    . ABDOMINAL HYSTERECTOMY W/ PARTIAL VAGINACTOMY    . CESAREAN SECTION    . COLONOSCOPY    . WRIST GANGLION EXCISION      MEDICATIONS: I have reviewed all medications and confirmed regimen as documented  Social History   Social History  . Marital status: Married    Spouse name: N/A  . Number of  children: N/A  . Years of education: N/A   Occupational History  . Not on file.   Social History Main Topics  . Smoking status: Former Smoker    Packs/day: 1.00    Years: 40.00    Types: Cigarettes    Start date: 05/04/1977  . Smokeless tobacco: Never Used     Comment: quit 06/25/16  . Alcohol use No  . Drug use: No  . Sexual activity: Yes   Other Topics Concern  . Not on file   Social History Narrative  . No narrative on file    Family History  Problem Relation Age of Onset  . Breast cancer Mother 74  . Bipolar disorder Mother   . Depression Mother   . Anxiety disorder Mother   . Heart attack Father   . Thyroid cancer Father   . Hypertension Father   . Depression Sister   . Bipolar disorder Sister   . Hypertension Sister   . Bipolar disorder Sister   . Lymphoma Sister   . Anxiety disorder Sister   . Bipolar disorder Sister     ROS: No fever, myalgias/arthralgias, unexplained weight loss or weight gain No new focal weakness or sensory deficits No otalgia, hearing loss, visual changes, nasal and sinus symptoms, mouth and throat problems No neck pain or adenopathy No abdominal pain, N/V/D, diarrhea, change in bowel pattern No dysuria, change in urinary pattern   Vitals:   08/16/16  1018  BP: 132/76  Pulse: (!) 107  SpO2: 97%  Weight: 224 lb 12.8 oz (102 kg)  Height: 5\' 7"  (1.702 m)     EXAM:  Gen: Obese, pleasant, No overt respiratory distress HEENT: NCAT, sclera white, oropharynx normal, nares patent with normal mucosa Neck: Supple without LAN, thyromegaly, JVD Lungs: breath sounds mildly diminished, percussion normal, No wheezes Cardiovascular: RRR, no murmurs noted Abdomen: Obese, soft, nontender, normal BS Ext: without clubbing, cyanosis, edema Neuro: CNs grossly intact, motor and sensory intact Skin: Limited exam, no lesions noted  DATA:   BMP Latest Ref Rng & Units 07/01/2016 11/30/2015 02/22/2012  Glucose 65 - 99 mg/dL 152(H) 125(H) 107(H)   BUN 6 - 20 mg/dL 6 8 8   Creatinine 0.44 - 1.00 mg/dL 0.98 1.06(H) 0.87  Sodium 135 - 145 mmol/L 136 136 140  Potassium 3.5 - 5.1 mmol/L 3.5 3.7 4.0  Chloride 101 - 111 mmol/L 97(L) 100(L) 106  CO2 22 - 32 mmol/L 29 28 29   Calcium 8.9 - 10.3 mg/dL 9.2 8.9 8.7    CBC Latest Ref Rng & Units 07/01/2016 11/30/2015 01/01/2015  WBC 3.6 - 11.0 K/uL 14.3(H) 16.5(H) 13.1(H)  Hemoglobin 12.0 - 16.0 g/dL 15.2 15.4 15.7  Hematocrit 35.0 - 47.0 % 45.1 48.4(H) 49.1(H)  Platelets 150 - 440 K/uL 443(H) 262 262    CXR (07/01/16):  NACPD  IMPRESSION:   1) Former smoker - recently quit 2) Exertional dyspnea and fatigue - likely multifactorial. There might be a component of COPD. Obesity is a contributor. Likely component of deconditioning 3) Heavy snoring and daytime hypersomnolence - likely OSA 4) Chest pain with exertional component -  She has multiple risk factors for CAD (Htn, HL, obesity, smoking history)  PLAN:  1) Trial of Breo inhaler - sample provided and order sent to her pharmacy 2) Continue PRN albuterol MDI 3) Home sleep study ordered 4) Dr Beryle Quant to consider further evaluation of exertional CP, perhaps with a stress test 5) Follow up in 4-6 weeks with full PFTs prior to that visit 6) I counseled in detail re: need for continue abstinence from smoking and discussed risk factors for relapse  Merton Border, MD PCCM service Mobile [phone] Pager [phone] 08/16/2016

## 2016-08-16 NOTE — Progress Notes (Signed)
Patient ID: Denise Spencer, female   DOB: [birth year], 56 y.o.   MRN: [id number] Patient seen in the office today and instructed on use of BREO ELLPITA.  Patient expressed understanding and demonstrated technique.

## 2016-08-17 DIAGNOSIS — Z1211 Encounter for screening for malignant neoplasm of colon: Secondary | ICD-10-CM | POA: Diagnosis not present

## 2016-08-17 DIAGNOSIS — R748 Abnormal levels of other serum enzymes: Secondary | ICD-10-CM | POA: Insufficient documentation

## 2016-08-17 DIAGNOSIS — Z0000 Encounter for general adult medical examination without abnormal findings: Secondary | ICD-10-CM | POA: Diagnosis not present

## 2016-08-17 DIAGNOSIS — Z79899 Other long term (current) drug therapy: Secondary | ICD-10-CM | POA: Diagnosis not present

## 2016-08-17 DIAGNOSIS — E7800 Pure hypercholesterolemia, unspecified: Secondary | ICD-10-CM | POA: Diagnosis not present

## 2016-08-17 DIAGNOSIS — R079 Chest pain, unspecified: Secondary | ICD-10-CM | POA: Diagnosis not present

## 2016-08-20 ENCOUNTER — Telehealth: Payer: Self-pay | Admitting: Pulmonary Disease

## 2016-08-20 NOTE — Telephone Encounter (Signed)
Please advise on message below.

## 2016-08-20 NOTE — Telephone Encounter (Signed)
Pt states Breo has caused her to have thrush. Would like a rx to treat this please call. [facility]. Pt states she gets sick with the "liquid medication for thrush". She request the logenz.

## 2016-08-21 ENCOUNTER — Other Ambulatory Visit: Payer: Self-pay | Admitting: Internal Medicine

## 2016-08-21 DIAGNOSIS — B370 Candidal stomatitis: Secondary | ICD-10-CM

## 2016-08-21 MED ORDER — CLOTRIMAZOLE 10 MG MT TROC: 10.0000 mg | Freq: Every day | OROMUCOSAL | 0 refills | Status: AC

## 2016-08-21 NOTE — Telephone Encounter (Signed)
Spoke with pt, Sent in script for clotrimazole lozenge 5 daily for 14 days. Asked to stop Breo.

## 2016-08-23 ENCOUNTER — Telehealth: Payer: Self-pay | Admitting: Pulmonary Disease

## 2016-08-23 MED ORDER — UMECLIDINIUM-VILANTEROL 62.5-25 MCG/INH IN AEPB: 1.0000 | INHALATION_SPRAY | Freq: Every day | RESPIRATORY_TRACT | 5 refills | Status: DC

## 2016-08-23 NOTE — Telephone Encounter (Signed)
Pt states she called the on call dr this weekend due to breakout of thrush. She stopped the [facility]. She is on medication for the thrush. Please call with recommendation of inhaler alternative.

## 2016-08-23 NOTE — Telephone Encounter (Signed)
[name] - one inhalation per day. Have her come by to pick up a sample or two. Emphasize to her that it does not have any steroid component (which is what causes thrush). It might be hard for [name] to get this approved for her. I will place order. Intolerance to inhaled steroid might pass for prior auth.   Denise Spencer

## 2016-08-23 NOTE — Telephone Encounter (Signed)
Please advise on message below.

## 2016-08-24 NOTE — Telephone Encounter (Signed)
Pt informed. Nothing further needed. 

## 2016-08-27 DIAGNOSIS — J342 Deviated nasal septum: Secondary | ICD-10-CM | POA: Diagnosis not present

## 2016-08-30 ENCOUNTER — Encounter: Payer: Self-pay | Admitting: Pulmonary Disease

## 2016-09-01 DIAGNOSIS — J343 Hypertrophy of nasal turbinates: Secondary | ICD-10-CM | POA: Diagnosis not present

## 2016-09-01 DIAGNOSIS — J342 Deviated nasal septum: Secondary | ICD-10-CM | POA: Diagnosis not present

## 2016-09-01 DIAGNOSIS — J301 Allergic rhinitis due to pollen: Secondary | ICD-10-CM | POA: Diagnosis not present

## 2016-09-02 ENCOUNTER — Telehealth: Payer: Self-pay | Admitting: Pulmonary Disease

## 2016-09-02 NOTE — Telephone Encounter (Signed)
Pt states insurance won't cover the [name] for her but will cover the Advair. Please advise on the dose and strength. Thanks.

## 2016-09-02 NOTE — Telephone Encounter (Signed)
Pt informed to call insurance company and find out what is covered in place of the [name] and to call me back and I will forward to provider. Pt verbalized understanding. Will wait on call back.

## 2016-09-02 NOTE — Telephone Encounter (Signed)
Patient is having problems with [employer] for umeclidinium-vilanterol ([name] ELLIPTA) 62.5-25 MCG/INH AEPB. She needs a medication change so insurance will pay for it. Please call patient.

## 2016-09-03 DIAGNOSIS — S335XXA Sprain of ligaments of lumbar spine, initial encounter: Secondary | ICD-10-CM | POA: Diagnosis not present

## 2016-09-03 DIAGNOSIS — M25551 Pain in right hip: Secondary | ICD-10-CM | POA: Diagnosis not present

## 2016-09-03 NOTE — Telephone Encounter (Signed)
Pt calling to just let us know that she talked to nurse yesterday and told her that Willeen Cass was going to be covered by her insurance  And it has not been called in yet Please send to [facility] [address] Please call patient once we do this

## 2016-09-03 NOTE — Telephone Encounter (Signed)
Spoke with pt, advised that we were awaiting KK's recs on what strength of Advair to send in, and that we would call her as soon as we know what strength.  KK please advise on what strength/dose of Advair you'd like patient to be on.  Thanks.

## 2016-09-06 ENCOUNTER — Encounter: Payer: Self-pay | Admitting: Internal Medicine

## 2016-09-06 ENCOUNTER — Encounter: Payer: Self-pay | Admitting: Psychiatry

## 2016-09-06 ENCOUNTER — Ambulatory Visit (INDEPENDENT_AMBULATORY_CARE_PROVIDER_SITE_OTHER): Payer: PPO | Admitting: Psychiatry

## 2016-09-06 VITALS — BP 111/76 | HR 101 | Temp 98.5°F | Wt 229.0 lb

## 2016-09-06 DIAGNOSIS — F332 Major depressive disorder, recurrent severe without psychotic features: Secondary | ICD-10-CM

## 2016-09-06 DIAGNOSIS — G4719 Other hypersomnia: Secondary | ICD-10-CM

## 2016-09-06 MED ORDER — FLUTICASONE-SALMETEROL 250-50 MCG/DOSE IN AEPB: 1.0000 | INHALATION_SPRAY | Freq: Two times a day (BID) | RESPIRATORY_TRACT | 3 refills | Status: DC

## 2016-09-06 MED ORDER — FLUOXETINE HCL 40 MG PO CAPS: 80.0000 mg | ORAL_CAPSULE | Freq: Every day | ORAL | 1 refills | Status: DC

## 2016-09-06 MED ORDER — CLONAZEPAM 1 MG PO TABS: 1.0000 mg | ORAL_TABLET | Freq: Three times a day (TID) | ORAL | 1 refills | Status: DC

## 2016-09-06 NOTE — Telephone Encounter (Signed)
250/50 BID

## 2016-09-06 NOTE — Telephone Encounter (Signed)
Routing to BT triage for follow up.

## 2016-09-06 NOTE — Telephone Encounter (Signed)
RX sent to [facility] for pt letting her know Advair has been sent. Nothing further needed.

## 2016-09-10 ENCOUNTER — Telehealth: Payer: Self-pay | Admitting: *Deleted

## 2016-09-10 DIAGNOSIS — G4733 Obstructive sleep apnea (adult) (pediatric): Secondary | ICD-10-CM

## 2016-09-10 NOTE — Telephone Encounter (Signed)
Pt informed of sleep study results. Order placed for auto CPAP. Nothing further needed.

## 2016-09-13 ENCOUNTER — Telehealth: Payer: Self-pay | Admitting: Pulmonary Disease

## 2016-09-13 NOTE — Telephone Encounter (Signed)
Pt calling stating the new inhaler Advair that she started last week Has now caused her mouth to break out with a rash  Spoke to her insurance company about trying to get the other inhaler (she did not remember what its called)  She was on this before this one and it did not have this side effects  Was denied by insurance before but was told that if we tried again they would try and get it approved Please advise

## 2016-09-13 NOTE — Telephone Encounter (Signed)
Pt states that the Advair has given her thrush like the Breo. A PA was done for the [name] on 09/02/16 and it was denied. Please advise.

## 2016-09-14 NOTE — Telephone Encounter (Signed)
Stop Advair. Will discuss further @ next visit  Waunita Schooner

## 2016-09-14 NOTE — Telephone Encounter (Signed)
Pt informed. Nothing further needed. 

## 2016-09-16 ENCOUNTER — Encounter: Payer: Self-pay | Admitting: Anesthesiology

## 2016-09-16 ENCOUNTER — Ambulatory Visit: Payer: PPO | Attending: Pulmonary Disease

## 2016-09-16 ENCOUNTER — Encounter: Payer: Self-pay | Admitting: *Deleted

## 2016-09-16 ENCOUNTER — Telehealth: Payer: Self-pay | Admitting: Pulmonary Disease

## 2016-09-16 DIAGNOSIS — Z87891 Personal history of nicotine dependence: Secondary | ICD-10-CM | POA: Diagnosis not present

## 2016-09-16 DIAGNOSIS — R0609 Other forms of dyspnea: Secondary | ICD-10-CM | POA: Diagnosis not present

## 2016-09-16 DIAGNOSIS — M545 Low back pain: Secondary | ICD-10-CM | POA: Diagnosis not present

## 2016-09-16 DIAGNOSIS — M47817 Spondylosis without myelopathy or radiculopathy, lumbosacral region: Secondary | ICD-10-CM | POA: Diagnosis not present

## 2016-09-16 DIAGNOSIS — M5106 Intervertebral disc disorders with myelopathy, lumbar region: Secondary | ICD-10-CM | POA: Diagnosis not present

## 2016-09-16 NOTE — Telephone Encounter (Signed)
Darlina Guys with [facility] stated through staff message that patient is declining CPAP Set up at this time. Pt is having surgery and wants to wait until after surgery to get set up on CPAP.  Just FYI for you-Rhonda J Cobb

## 2016-09-20 NOTE — Discharge Instructions (Signed)
[facility] °[facility] °ENDOSCOPIC SINUS SURGERY °[facility] ° °What is Functional Endoscopic Sinus Surgery? ° The Surgery involves making the natural openings of the sinuses larger by removing the bony partitions that separate the sinuses from the nasal cavity.  The natural sinus lining is preserved as much as possible to allow the sinuses to resume normal function after the surgery.  In some patients nasal polyps (excessively swollen lining of the sinuses) may be removed to relieve obstruction of the sinus openings.  The surgery is performed through the nose using lighted scopes, which eliminates the need for incisions on the face.  A septoplasty is a different procedure which is sometimes performed with sinus surgery.  It involves straightening the boy partition that separates the two sides of your nose.  A crooked or deviated septum may need repair if is obstructing the sinuses or nasal airflow.  Turbinate reduction is also often performed during sinus surgery.  The turbinates are bony proturberances from the side walls of the nose which swell and can obstruct the nose in patients with sinus and allergy problems.  Their size can be surgically reduced to help relieve nasal obstruction. ° °What Can Sinus Surgery Do For Me? ° Sinus surgery can reduce the frequency of sinus infections requiring antibiotic treatment.  This can provide improvement in nasal congestion, post-nasal drainage, facial pressure and nasal obstruction.  Surgery will NOT prevent you from ever having an infection again, so it usually only for patients who get infections 4 or more times yearly requiring antibiotics, or for infections that do not clear with antibiotics.  It will not cure nasal allergies, so patients with allergies may still require medication to treat their allergies after surgery. Surgery may improve headaches related to sinusitis, however, some people will continue to  require medication to control sinus headaches related to allergies.  Surgery will do nothing for other forms of headache (migraine, tension or cluster). ° °What Are the Risks of Endoscopic Sinus Surgery? ° Current techniques allow surgery to be performed safely with little risk, however, there are rare complications that patients should be aware of.  Because the sinuses are located around the eyes, there is risk of eye injury, including blindness, though again, this would be quite rare. This is usually a result of bleeding behind the eye during surgery, which puts the vision oat risk, though there are treatments to protect the vision and prevent permanent disrupted by surgery causing a leak of the spinal fluid that surrounds the brain.  More serious complications would include bleeding inside the brain cavity or damage to the brain.  Again, all of these complications are uncommon, and spinal fluid leaks can be safely managed surgically if they occur.  The most common complication of sinus surgery is bleeding from the nose, which may require packing or cauterization of the nose.  Continued sinus have polyps may experience recurrence of the polyps requiring revision surgery.  Alterations of sense of smell or injury to the tear ducts are also rare complications.  ° °What is the Surgery Like, and what is the Recovery? ° The Surgery usually takes a couple of hours to perform, and is usually performed under a general anesthetic (completely asleep).  Patients are usually discharged home after a couple of hours.  Sometimes during surgery it is necessary to pack the nose to control bleeding, and the packing is left in place for 24 - 48 hours, and removed by your surgeon.    If a septoplasty was performed during the procedure, there is often a splint placed which must be removed after 5-7 days.   °Discomfort: Pain is usually mild to moderate, and can be controlled by prescription pain medication or acetaminophen (Tylenol).   Aspirin, Ibuprofen (Advil, Motrin), or Naprosyn (Aleve) should be avoided, as they can cause increased bleeding.  Most patients feel sinus pressure like they have a bad head cold for several days.  Sleeping with your head elevated can help reduce swelling and facial pressure, as can ice packs over the face.  A humidifier may be helpful to keep the mucous and blood from drying in the nose.  ° °Diet: There are no specific diet restrictions, however, you should generally start with clear liquids and a light diet of bland foods because the anesthetic can cause some nausea.  Advance your diet depending on how your stomach feels.  Taking your pain medication with food will often help reduce stomach upset which pain medications can cause. ° °Nasal Saline Irrigation: It is important to remove blood clots and dried mucous from the nose as it is healing.  This is done by having you irrigate the nose at least 3 - 4 times daily with a salt water solution.  We recommend using NeilMed Sinus Rinse (available at the drug store).  Fill the squeeze bottle with the solution, bend over a sink, and insert the tip of the squeeze bottle into the nose ½ of an inch.  Point the tip of the squeeze bottle towards the inside corner of the eye on the same side your irrigating.  Squeeze the bottle and gently irrigate the nose.  If you bend forward as you do this, most of the fluid will flow back out of the nose, instead of down your throat.   The solution should be warm, near body temperature, when you irrigate.   Each time you irrigate, you should use a full squeeze bottle.  ° °Note that if you are instructed to use Nasal Steroid Sprays at any time after your surgery, irrigate with saline BEFORE using the steroid spray, so you do not wash it all out of the nose. °Another product, Nasal Saline Gel (such as AYR Nasal Saline Gel) can be applied in each nostril 3 - 4 times daily to moisture the nose and reduce scabbing or crusting. ° °Bleeding:   Bloody drainage from the nose can be expected for several days, and patients are instructed to irrigate their nose frequently with salt water to help remove mucous and blood clots.  The drainage may be dark red or brown, though some fresh blood may be seen intermittently, especially after irrigation.  Do not blow you nose, as bleeding may occur. If you must sneeze, keep your mouth open to allow air to escape through your mouth. ° °If heavy bleeding occurs: Irrigate the nose with saline to rinse out clots, then spray the nose 3 - 4 times with Afrin Nasal Decongestant Spray.  The spray will constrict the blood vessels to slow bleeding.  Pinch the lower half of your nose shut to apply pressure, and lay down with your head elevated.  Ice packs over the nose may help as well. If bleeding persists despite these measures, you should notify your doctor.  Do not use the Afrin routinely to control nasal congestion after surgery, as it can result in worsening congestion and may affect healing.  ° °Activity: Return to work varies among patients. Most patients will be out of   work at least 5 - 7 days to recover.  Patient may return to work after they are off of narcotic pain medication, and feeling well enough to perform the functions of their job.  Patients must avoid heavy lifting (over 10 pounds) or strenuous physical for 2 weeks after surgery, so your employer may need to assign you to light duty, or keep you out of work longer if light duty is not possible.  NOTE: you should not drive, operate dangerous machinery, do any mentally demanding tasks or make any important legal or financial decisions while on narcotic pain medication and recovering from the general anesthetic.  °  °Call Your Doctor Immediately if You Have Any of the Following: °1. Bleeding that you cannot control with the above measures °2. Loss of vision, double vision, bulging of the eye or black eyes. °3. Fever over 101 degrees °4. Neck stiffness with severe  headache, fever, nausea and change in mental state. °You are always encourage to call anytime with concerns, however, please call with requests for pain medication refills during office hours. ° °Office Endoscopy: During follow-up visits your doctor will remove any packing or splints that may have been placed and evaluate and clean your sinuses endoscopically.  Topical anesthetic will be used to make this as comfortable as possible, though you may want to take your pain medication prior to the visit.  How often this will need to be done varies from patient to patient.  After complete recovery from the surgery, you may need follow-up endoscopy from time to time, particularly if there is concern of recurrent infection or nasal polyps. ° ° °General Anesthesia, Adult, Care After °These instructions provide you with information about caring for yourself after your procedure. Your health care provider may also give you more specific instructions. Your treatment has been planned according to current medical practices, but problems sometimes occur. Call your health care provider if you have any problems or questions after your procedure. °What can I expect after the procedure? °After the procedure, it is common to have: °· Vomiting. °· A sore throat. °· Mental slowness. °It is common to feel: °· Nauseous. °· Cold or shivery. °· Sleepy. °· Tired. °· Sore or achy, even in parts of your body where you did not have surgery. °Follow these instructions at home: °For at least 24 hours after the procedure: °· Do not: °¨ Participate in activities where you could fall or become injured. °¨ Drive. °¨ Use heavy machinery. °¨ Drink alcohol. °¨ Take sleeping pills or medicines that cause drowsiness. °¨ Make important decisions or sign legal documents. °¨ Take care of children on your own. °· Rest. °Eating and drinking °· If you vomit, drink water, juice, or soup when you can drink without vomiting. °· Drink enough fluid to keep your  urine clear or pale yellow. °· Make sure you have little or no nausea before eating solid foods. °· Follow the diet recommended by your health care provider. °General instructions °· Have a responsible adult stay with you until you are awake and alert. °· Return to your normal activities as told by your health care provider. Ask your health care provider what activities are safe for you. °· Take over-the-counter and prescription medicines only as told by your health care provider. °· If you smoke, do not smoke without supervision. °· Keep all follow-up visits as told by your health care provider. This is important. °Contact a health care provider if: °· You continue to have nausea   or vomiting at home, and medicines are not helpful. °· You cannot drink fluids or start eating again. °· You cannot urinate after 8-12 hours. °· You develop a skin rash. °· You have fever. °· You have increasing redness at the site of your procedure. °Get help right away if: °· You have difficulty breathing. °· You have chest pain. °· You have unexpected bleeding. °· You feel that you are having a life-threatening or urgent problem. °This information is not intended to replace advice given to you by your health care provider. Make sure you discuss any questions you have with your health care provider. °Document Released: 09/13/2000 Document Revised: 11/10/2015 Document Reviewed: 05/22/2015 °Elsevier Interactive Patient Education © 2017 [employer] ° °

## 2016-09-21 ENCOUNTER — Ambulatory Visit: Payer: PPO

## 2016-09-23 ENCOUNTER — Ambulatory Visit: Admission: RE | Admit: 2016-09-23 | Payer: PPO | Source: Ambulatory Visit | Admitting: Otolaryngology

## 2016-09-23 HISTORY — DX: Other intervertebral disc degeneration, lumbar region: M51.36

## 2016-09-23 HISTORY — DX: Unspecified osteoarthritis, unspecified site: M19.90

## 2016-09-23 HISTORY — DX: Presence of dental prosthetic device (complete) (partial): Z97.2

## 2016-09-23 HISTORY — DX: Gastro-esophageal reflux disease without esophagitis: K21.9

## 2016-09-23 HISTORY — DX: Other intervertebral disc degeneration, lumbar region without mention of lumbar back pain or lower extremity pain: M51.369

## 2016-09-23 HISTORY — DX: Other intervertebral disc displacement, lumbar region: M51.26

## 2016-09-23 HISTORY — DX: Dyspnea, unspecified: R06.00

## 2016-09-23 HISTORY — DX: Sleep apnea, unspecified: G47.30

## 2016-09-23 SURGERY — SEPTOPLASTY, NOSE, WITH NASAL TURBINATE REDUCTION
Anesthesia: General

## 2016-09-27 ENCOUNTER — Ambulatory Visit (INDEPENDENT_AMBULATORY_CARE_PROVIDER_SITE_OTHER): Payer: PPO | Admitting: Pulmonary Disease

## 2016-09-27 ENCOUNTER — Encounter: Payer: Self-pay | Admitting: Pulmonary Disease

## 2016-09-27 VITALS — BP 128/86 | HR 98 | Wt 231.0 lb

## 2016-09-27 DIAGNOSIS — G4733 Obstructive sleep apnea (adult) (pediatric): Secondary | ICD-10-CM

## 2016-09-27 DIAGNOSIS — J449 Chronic obstructive pulmonary disease, unspecified: Secondary | ICD-10-CM | POA: Diagnosis not present

## 2016-09-27 NOTE — Progress Notes (Signed)
PULMONARY OFFICE FOLLOW-UP NOTE  Requesting MD/Service: Beryle Quant Date of initial consultation: 08/16/16 Reason for consultation: cough, dyspnea  PT PROFILE: 56 y.o. former smoker (quit 06/25/16) referred for evaluation of cough and dyspnea. Also reported heavy snoring and daytime hypersomnolence  DATA: PSG 09/06/16: Moderate OSA> AHI 27/hr. 320 desaturation events. Lowest SpO2 77% PFTs 09/16/16: FVC:  3.46 L ( 100 %pred), FEV1:  2.67 L ( 95 %pred), FEV1/FVC: 77% , TLC:  5.60 L ( 101 %pred), DLCO  66 %pred, DLCO/VA normal.   SUBJ: This is a routine reevaluation to review the results of her pulmonary function tests and sleep study. He was initially evaluated for cough. This is now all but resolved. She also reports minimal exertional dyspnea. I initially prescribed the Breo inhaler which caused oral thrush. We changed her to [facility] which she could not afford. We tried Advair which also caused oral thrush and was stopped. She presently only has an albuterol rescue inhaler which she rarely uses. She describes minimal exertional dyspnea H that she is not smoking but still has exposure to secondhand smoke (husband). There is a plan for surgery to repair a deviated septum  -this has not yet been scheduled not yet.    Vitals:   09/27/16 1028  BP: 128/86  Pulse: 98  SpO2: 100%  Weight: 104.8 kg (231 lb)     EXAM:  Gen: Obese, NAD HEENT: NCAT, sclera white, oropharynx normal, nares patent with normal mucosa Neck: Supple without LAN, thyromegaly, JVD Lungs: breath sounds full, No wheezes Cardiovascular: RRR, no murmurs noted Abdomen: Obese, soft, nontender, normal BS Ext: without clubbing, cyanosis, edema Neuro: grossly intact  DATA:   BMP Latest Ref Rng & Units 07/01/2016 11/30/2015 02/22/2012  Glucose 65 - 99 mg/dL 152(H) 125(H) 107(H)  BUN 6 - 20 mg/dL 6 8 8   Creatinine 0.44 - 1.00 mg/dL 0.98 1.06(H) 0.87  Sodium 135 - 145 mmol/L 136 136 140  Potassium 3.5 - 5.1 mmol/L 3.5 3.7 4.0   Chloride 101 - 111 mmol/L 97(L) 100(L) 106  CO2 22 - 32 mmol/L 29 28 29   Calcium 8.9 - 10.3 mg/dL 9.2 8.9 8.7    CBC Latest Ref Rng & Units 07/01/2016 11/30/2015 01/01/2015  WBC 3.6 - 11.0 K/uL 14.3(H) 16.5(H) 13.1(H)  Hemoglobin 12.0 - 16.0 g/dL 15.2 15.4 15.7  Hematocrit 35.0 - 47.0 % 45.1 48.4(H) 49.1(H)  Platelets 150 - 440 K/uL 443(H) 262 262    CXR (07/01/16):  NACPD  IMPRESSION:   1) Former smoker  2) Very mild COPD 3) Exertional dyspnea, minimal - essentially resolved. Likely mostly due to obesity 4) Moderate OSA  PLAN:  1) Continue PRN albuterol MDI 2) CPAP initiation has been scheduled 3) we discussed weight loss strategies and she is to continue efforts at this 6) follow-up in 3-4 months to assess her response to CPAP  Merton Border, MD PCCM service Mobile [phone] Pager [phone] 09/27/2016

## 2016-09-27 NOTE — Patient Instructions (Signed)
Initiate CPAP as planned  Continue albuterol inhaler as needed for shortness of breath, wheezing, cough, chest tightness. Encouraged more liberal use of albuterol inhaler  Continue Flonase inhaler  Continue efforts at weight loss. This will help reduce the severity of sleep apnea  Follow-up in 3-4 months

## 2016-10-05 DIAGNOSIS — G4733 Obstructive sleep apnea (adult) (pediatric): Secondary | ICD-10-CM | POA: Diagnosis not present

## 2016-10-08 DIAGNOSIS — M25561 Pain in right knee: Secondary | ICD-10-CM | POA: Diagnosis not present

## 2016-10-08 DIAGNOSIS — M25562 Pain in left knee: Secondary | ICD-10-CM | POA: Diagnosis not present

## 2016-10-14 ENCOUNTER — Other Ambulatory Visit: Payer: Self-pay

## 2016-10-14 ENCOUNTER — Telehealth: Payer: Self-pay

## 2016-10-14 DIAGNOSIS — Z86010 Personal history of colonic polyps: Secondary | ICD-10-CM

## 2016-10-14 DIAGNOSIS — M170 Bilateral primary osteoarthritis of knee: Secondary | ICD-10-CM | POA: Diagnosis not present

## 2016-10-14 DIAGNOSIS — K219 Gastro-esophageal reflux disease without esophagitis: Secondary | ICD-10-CM

## 2016-10-14 NOTE — Telephone Encounter (Signed)
Gastroenterology Pre-Procedure Review  Request Date: 5/14 Requesting Physician: [name]  PATIENT REVIEW QUESTIONS: The patient responded to the following health history questions as indicated:    1. Are you having any GI issues? GERD, aspiration 2. Do you have a personal history of Polyps? yes (2015) 3. Do you have a family history of Colon Cancer or Polyps? no 4. Diabetes Mellitus? no 5. Joint replacements in the past 12 months?no 6. Major health problems in the past 3 months?no 7. Any artificial heart valves, MVP, or defibrillator?no    MEDICATIONS & ALLERGIES:    Patient reports the following regarding taking any anticoagulation/antiplatelet therapy:   Plavix, Coumadin, Eliquis, Xarelto, Lovenox, Pradaxa, Brilinta, or Effient? no Aspirin? no (ALLERGIC)  Patient confirms/reports the following medications:  Current Outpatient Prescriptions  Medication Sig Dispense Refill  . albuterol (PROVENTIL HFA;VENTOLIN HFA) 108 (90 Base) MCG/ACT inhaler Inhale 2 puffs into the lungs every 6 (six) hours as needed for wheezing or shortness of breath. 1 Inhaler 2  . clonazePAM (KLONOPIN) 1 MG tablet Take 1 tablet (1 mg total) by mouth 3 (three) times daily. 270 tablet 1  . cyclobenzaprine (FLEXERIL) 10 MG tablet     . estradiol (VIVELLE-DOT) 0.1 MG/24HR patch Place onto the skin.    Marland Kitchen FLUoxetine (PROZAC) 40 MG capsule Take 2 capsules (80 mg total) by mouth daily. 180 capsule 1  . gabapentin (NEURONTIN) 100 MG capsule 300 mg 3 (three) times daily.     . Loratadine 10 MG CAPS Take 1 capsule by mouth daily.     . nortriptyline (PAMELOR) 50 MG capsule Take 50 mg by mouth at bedtime.    Marland Kitchen omeprazole (PRILOSEC) 20 MG capsule 20 mg daily.     . simvastatin (ZOCOR) 10 MG tablet Take 10 mg by mouth daily at 6 PM.      No current facility-administered medications for this visit.     Patient confirms/reports the following allergies:  Allergies  Allergen Reactions  . Advair Diskus  [Fluticasone-Salmeterol]     Caused thrush  . Aspirin     Muscle and joint pain  . Breo Ellipta [Fluticasone Furoate-Vilanterol]     Caused thrush  . Nsaids Nausea And Vomiting  . Tape     Some adhesives cause blisters.  Tegaderm is OK.  . Ciprofloxacin Rash       . Doxycycline Rash  . Erythromycin Rash  . Penicillins Rash  . Sulfa Antibiotics Rash         No orders of the defined types were placed in this encounter.   AUTHORIZATION INFORMATION Primary Insurance: 1D#: Group #:  Secondary Insurance: 1D#: Group #:  SCHEDULE INFORMATION: Date: 5/14 Time: Location:  [place]

## 2016-10-15 ENCOUNTER — Telehealth: Payer: Self-pay | Admitting: Gastroenterology

## 2016-10-15 NOTE — Telephone Encounter (Signed)
10/15/16 [employer] approved EGD & Colonoscopy Auth# [id number].

## 2016-10-21 DIAGNOSIS — S93491A Sprain of other ligament of right ankle, initial encounter: Secondary | ICD-10-CM | POA: Diagnosis not present

## 2016-10-21 DIAGNOSIS — M170 Bilateral primary osteoarthritis of knee: Secondary | ICD-10-CM | POA: Diagnosis not present

## 2016-10-28 DIAGNOSIS — M170 Bilateral primary osteoarthritis of knee: Secondary | ICD-10-CM | POA: Diagnosis not present

## 2016-10-29 NOTE — Discharge Instructions (Signed)

## 2016-11-01 ENCOUNTER — Ambulatory Visit: Payer: PPO | Admitting: Anesthesiology

## 2016-11-01 ENCOUNTER — Encounter
Admission: RE | Disposition: A | Discharge status: Home / Self Care | Payer: Self-pay | Source: Ambulatory Visit | Attending: Gastroenterology

## 2016-11-01 ENCOUNTER — Ambulatory Visit
Admission: RE | Admit: 2016-11-01 | Discharge: 2016-11-01 | Disposition: A | Discharge status: Home / Self Care | Payer: PPO | Source: Ambulatory Visit | Attending: Gastroenterology | Admitting: Gastroenterology

## 2016-11-01 DIAGNOSIS — Z87891 Personal history of nicotine dependence: Secondary | ICD-10-CM | POA: Insufficient documentation

## 2016-11-01 DIAGNOSIS — Z91048 Other nonmedicinal substance allergy status: Secondary | ICD-10-CM | POA: Diagnosis not present

## 2016-11-01 DIAGNOSIS — K219 Gastro-esophageal reflux disease without esophagitis: Secondary | ICD-10-CM | POA: Diagnosis not present

## 2016-11-01 DIAGNOSIS — Z79899 Other long term (current) drug therapy: Secondary | ICD-10-CM | POA: Diagnosis not present

## 2016-11-01 DIAGNOSIS — D122 Benign neoplasm of ascending colon: Secondary | ICD-10-CM

## 2016-11-01 DIAGNOSIS — G4730 Sleep apnea, unspecified: Secondary | ICD-10-CM | POA: Insufficient documentation

## 2016-11-01 DIAGNOSIS — F319 Bipolar disorder, unspecified: Secondary | ICD-10-CM | POA: Insufficient documentation

## 2016-11-01 DIAGNOSIS — D124 Benign neoplasm of descending colon: Secondary | ICD-10-CM

## 2016-11-01 DIAGNOSIS — Z886 Allergy status to analgesic agent status: Secondary | ICD-10-CM | POA: Diagnosis not present

## 2016-11-01 DIAGNOSIS — D123 Benign neoplasm of transverse colon: Secondary | ICD-10-CM | POA: Diagnosis not present

## 2016-11-01 DIAGNOSIS — K635 Polyp of colon: Secondary | ICD-10-CM | POA: Insufficient documentation

## 2016-11-01 DIAGNOSIS — F419 Anxiety disorder, unspecified: Secondary | ICD-10-CM | POA: Diagnosis not present

## 2016-11-01 DIAGNOSIS — Z888 Allergy status to other drugs, medicaments and biological substances status: Secondary | ICD-10-CM | POA: Insufficient documentation

## 2016-11-01 DIAGNOSIS — R1310 Dysphagia, unspecified: Secondary | ICD-10-CM

## 2016-11-01 DIAGNOSIS — Z79890 Hormone replacement therapy (postmenopausal): Secondary | ICD-10-CM | POA: Diagnosis not present

## 2016-11-01 DIAGNOSIS — Z880 Allergy status to penicillin: Secondary | ICD-10-CM | POA: Diagnosis not present

## 2016-11-01 DIAGNOSIS — Z860100 Personal history of colon polyps, unspecified: Secondary | ICD-10-CM

## 2016-11-01 DIAGNOSIS — Z1211 Encounter for screening for malignant neoplasm of colon: Secondary | ICD-10-CM | POA: Insufficient documentation

## 2016-11-01 HISTORY — PX: ESOPHAGOGASTRODUODENOSCOPY (EGD) WITH PROPOFOL: SHX5813

## 2016-11-01 HISTORY — DX: Other specified postprocedural states: R11.2

## 2016-11-01 HISTORY — DX: Other specified postprocedural states: Z98.890

## 2016-11-01 HISTORY — PX: POLYPECTOMY: SHX5525

## 2016-11-01 HISTORY — PX: COLONOSCOPY WITH PROPOFOL: SHX5780

## 2016-11-01 HISTORY — DX: Nausea with vomiting, unspecified: R11.2

## 2016-11-01 SURGERY — COLONOSCOPY WITH PROPOFOL
Anesthesia: Monitor Anesthesia Care | Wound class: Clean

## 2016-11-01 MED ORDER — GLYCOPYRROLATE 0.2 MG/ML IJ SOLN
INTRAMUSCULAR | Status: DC | PRN
  Administered 2016-11-01: 0.1 mg via INTRAVENOUS

## 2016-11-01 MED ORDER — LIDOCAINE HCL (CARDIAC) 20 MG/ML IV SOLN
INTRAVENOUS | Status: DC | PRN
  Administered 2016-11-01: 30 mg via INTRAVENOUS

## 2016-11-01 MED ORDER — STERILE WATER FOR IRRIGATION IR SOLN
Status: DC | PRN
  Administered 2016-11-01: 08:00:00

## 2016-11-01 MED ORDER — PROPOFOL 10 MG/ML IV BOLUS
INTRAVENOUS | Status: DC | PRN
  Administered 2016-11-01: 40 mg via INTRAVENOUS
  Administered 2016-11-01: 20 mg via INTRAVENOUS
  Administered 2016-11-01: 40 mg via INTRAVENOUS
  Administered 2016-11-01: 80 mg via INTRAVENOUS
  Administered 2016-11-01 (×4): 40 mg via INTRAVENOUS
  Administered 2016-11-01: 80 mg via INTRAVENOUS
  Administered 2016-11-01: 40 mg via INTRAVENOUS

## 2016-11-01 MED ORDER — LACTATED RINGERS IV SOLN
INTRAVENOUS | Status: DC
  Administered 2016-11-01: 08:00:00 via INTRAVENOUS

## 2016-11-01 SURGICAL SUPPLY — 35 items
BALLN DILATOR 10-12 8 (BALLOONS)
BALLN DILATOR 12-15 8 (BALLOONS)
BALLN DILATOR 15-18 8 (BALLOONS)
BALLN DILATOR CRE 0-12 8 (BALLOONS)
BALLN DILATOR ESOPH 8 10 CRE (MISCELLANEOUS) IMPLANT
BALLOON DILATOR 12-15 8 (BALLOONS) IMPLANT
BALLOON DILATOR 15-18 8 (BALLOONS) IMPLANT
BALLOON DILATOR CRE 0-12 8 (BALLOONS) IMPLANT
BLOCK BITE 60FR ADLT L/F GRN (MISCELLANEOUS) ×3 IMPLANT
CANISTER SUCT 1200ML W/VALVE (MISCELLANEOUS) ×3 IMPLANT
CLIP HMST 235XBRD CATH ROT (MISCELLANEOUS) IMPLANT
CLIP RESOLUTION 360 11X235 (MISCELLANEOUS)
FCP ESCP3.2XJMB 240X2.8X (MISCELLANEOUS)
FORCEPS BIOP RAD 4 LRG CAP 4 (CUTTING FORCEPS) ×3 IMPLANT
FORCEPS BIOP RJ4 240 W/NDL (MISCELLANEOUS)
FORCEPS ESCP3.2XJMB 240X2.8X (MISCELLANEOUS) IMPLANT
GOWN CVR UNV OPN BCK APRN NK (MISCELLANEOUS) ×4 IMPLANT
GOWN ISOL THUMB LOOP REG UNIV (MISCELLANEOUS) ×2
INJECTOR VARIJECT VIN23 (MISCELLANEOUS) IMPLANT
KIT DEFENDO VALVE AND CONN (KITS) IMPLANT
KIT ENDO PROCEDURE OLY (KITS) ×3 IMPLANT
MARKER SPOT ENDO TATTOO 5ML (MISCELLANEOUS) IMPLANT
PAD GROUND ADULT SPLIT (MISCELLANEOUS) IMPLANT
PROBE APC STR FIRE (PROBE) IMPLANT
RETRIEVER NET PLAT FOOD (MISCELLANEOUS) IMPLANT
RETRIEVER NET ROTH 2.5X230 LF (MISCELLANEOUS) IMPLANT
SNARE SHORT THROW 13M SML OVAL (MISCELLANEOUS) ×3 IMPLANT
SNARE SHORT THROW 30M LRG OVAL (MISCELLANEOUS) IMPLANT
SNARE SNG USE RND 15MM (INSTRUMENTS) IMPLANT
SPOT EX ENDOSCOPIC TATTOO (MISCELLANEOUS)
SYR INFLATION 60ML (SYRINGE) IMPLANT
TRAP ETRAP POLY (MISCELLANEOUS) ×3 IMPLANT
VARIJECT INJECTOR VIN23 (MISCELLANEOUS)
WATER STERILE IRR 250ML POUR (IV SOLUTION) ×3 IMPLANT
WIRE CRE 18-20MM 8CM F G (MISCELLANEOUS) IMPLANT

## 2016-11-01 NOTE — H&P (Signed)
Denise Lame, MD [name] [name], [name] [address] Phone:[phone] Fax : [phone]  Primary Care Physician:  [name], MD Primary Gastroenterologist:  [name]  Pre-Procedure History & Physical: HPI:  Denise Spencer is a 56 y.o. female is here for an colonoscopy.   Past Medical History:  Diagnosis Date  . Anxiety   . Arthritis    knees, lower back  . Bipolar disorder ([place])   . Bulging lumbar disc    L5-S1  . Depression   . Dyspnea   . GERD (gastroesophageal reflux disease)   . PONV (postoperative nausea and vomiting)    with hysterectomy only  . Sleep apnea    is to get CPAP  . Wears dentures    full upper and lower    Past Surgical History:  Procedure Laterality Date  . ABDOMINAL HYSTERECTOMY    . ABDOMINAL HYSTERECTOMY W/ PARTIAL VAGINACTOMY    . CESAREAN SECTION    . COLONOSCOPY    . WRIST GANGLION EXCISION      Prior to Admission medications   Medication Sig Start Date End Date Taking? Authorizing Provider  albuterol (PROVENTIL HFA;VENTOLIN HFA) 108 (90 Base) MCG/ACT inhaler Inhale 2 puffs into the lungs every 6 (six) hours as needed for wheezing or shortness of breath. 07/01/16  Yes [name], [name], MD  clonazePAM (KLONOPIN) 1 MG tablet Take 1 tablet (1 mg total) by mouth 3 (three) times daily. 09/06/16  Yes [name], [name], MD  cyclobenzaprine (FLEXERIL) 10 MG tablet  12/06/14  Yes [provider]  estradiol (VIVELLE-DOT) 0.1 MG/24HR patch Place onto the skin. 07/12/16 07/12/17 Yes [provider]  FLUoxetine (PROZAC) 40 MG capsule Take 2 capsules (80 mg total) by mouth daily. 09/06/16 03/05/17 Yes [name], [name], MD  gabapentin (NEURONTIN) 100 MG capsule 300 mg 3 (three) times daily.  11/30/14  Yes [provider]  Loratadine 10 MG CAPS Take 1 capsule by mouth daily.    Yes [provider]  nortriptyline (PAMELOR) 50 MG capsule Take 50 mg by mouth at bedtime.   Yes [provider]    pantoprazole (PROTONIX) 20 MG tablet Take 20 mg by mouth daily.   Yes [provider]  simvastatin (ZOCOR) 10 MG tablet Take 10 mg by mouth daily at 6 PM.  01/17/14  Yes [provider]  omeprazole (PRILOSEC) 20 MG capsule 20 mg daily.  11/30/14   [provider]    Allergies as of 10/14/2016 - Review Complete 09/27/2016  Allergen Reaction Noted  . Advair diskus [fluticasone-salmeterol]  09/16/2016  . Aspirin  12/20/2014  . Breo ellipta [fluticasone furoate-vilanterol]  09/16/2016  . Nsaids Nausea And Vomiting 12/20/2014  . Tape  09/16/2016  . Ciprofloxacin Rash 01/01/2015  . Doxycycline Rash 12/20/2014  . Erythromycin Rash 12/20/2014  . Penicillins Rash 12/20/2014  . Sulfa antibiotics Rash 01/01/2015    Family History  Problem Relation Age of Onset  . Breast cancer Mother 84  . Bipolar disorder Mother   . Depression Mother   . Anxiety disorder Mother   . Heart attack Father   . Thyroid cancer Father   . Hypertension Father   . Depression Sister   . Bipolar disorder Sister   . Hypertension Sister   . Bipolar disorder Sister   . Lymphoma Sister   . Anxiety disorder Sister   . Bipolar disorder Sister     Social History   Social History  . Marital status: Married  Spouse name: N/A  . Number of children: N/A  . Years of education: N/A   Occupational History  . Not on file.   Social History Main Topics  . Smoking status: Former Smoker    Packs/day: 1.00    Years: 40.00    Types: Cigarettes    Start date: 05/04/1977    Quit date: 06/25/2016  . Smokeless tobacco: Never Used     Comment:    . Alcohol use No  . Drug use: No  . Sexual activity: Yes   Other Topics Concern  . Not on file   Social History Narrative  . No narrative on file    Review of Systems: See HPI, otherwise negative ROS  Physical Exam: BP (!) 157/89   Pulse 100   Temp 97.2 F (36.2 C)   Resp 16   Ht 5\' 7"  (1.702 m)   Wt 236 lb (107 kg)   SpO2 100%    BMI 36.96 kg/m  General:   Alert,  pleasant and cooperative in NAD Head:  Normocephalic and atraumatic. Neck:  Supple; no masses or thyromegaly. Lungs:  Clear throughout to auscultation.    Heart:  Regular rate and rhythm. Abdomen:  Soft, nontender and nondistended. Normal bowel sounds, without guarding, and without rebound.   Neurologic:  Alert and  oriented x4;  grossly normal neurologically.  Impression/Plan: OCTA UPLINGER is here for an colonoscopy to be performed for history of colon polyps  Risks, benefits, limitations, and alternatives regarding  colonoscopy have been reviewed with the patient.  Questions have been answered.  All parties agreeable.   Denise Lame, MD  11/01/2016, 7:44 AM

## 2016-11-01 NOTE — Anesthesia Procedure Notes (Signed)
Procedure Name: MAC Performed by: Lind Guest Pre-anesthesia Checklist: Patient identified, Emergency Drugs available, Suction available, Patient being monitored and Timeout performed Patient Re-evaluated:Patient Re-evaluated prior to inductionOxygen Delivery Method: Nasal cannula

## 2016-11-01 NOTE — Op Note (Signed)
[facility] Gastroenterology Patient Name: Denise Spencer Procedure Date: 11/01/2016 8:05 AM MRN: [id number] Account #: [id number] Date of Birth: [birth year] Admit Type: Outpatient Age: 56 Room: [facility] OR ROOM 01 Gender: Female Note Status: Finalized Procedure:            Colonoscopy Indications:          High risk colon cancer surveillance: Personal history                        of colonic polyps Providers:            [name] MD, MD Referring MD:         Caprice Renshaw MD (Referring MD) Medicines:            Propofol per Anesthesia Complications:        No immediate complications. Procedure:            Pre-Anesthesia Assessment:                       - Prior to the procedure, a History and Physical was                        performed, and patient medications and allergies were                        reviewed. The patient's tolerance of previous                        anesthesia was also reviewed. The risks and benefits of                        the procedure and the sedation options and risks were                        discussed with the patient. All questions were                        answered, and informed consent was obtained. Prior                        Anticoagulants: The patient has taken no previous                        anticoagulant or antiplatelet agents. ASA Grade                        Assessment: II - A patient with mild systemic disease.                        After reviewing the risks and benefits, the patient was                        deemed in satisfactory condition to undergo the                        procedure.                       After obtaining informed consent, the colonoscope was  passed under direct vision. Throughout the procedure,                        the patient's blood pressure, pulse, and oxygen                        saturations were monitored continuously. The Olympus   Colonoscope 190 [id number]) was introduced through the                        anus and advanced to the the cecum, identified by                        appendiceal orifice and ileocecal valve. The                        colonoscopy was performed without difficulty. The                        patient tolerated the procedure well. The quality of                        the bowel preparation was good. Findings:      The perianal and digital rectal examinations were normal.      A 4 mm polyp was found in the ascending colon. The polyp was sessile.       The polyp was removed with a cold snare. Resection and retrieval were       complete.      A 3 mm polyp was found in the transverse colon. The polyp was sessile.       The polyp was removed with a cold biopsy forceps. Resection and       retrieval were complete.      Three sessile polyps were found in the descending colon. The polyps were       2 to 3 mm in size. These polyps were removed with a cold biopsy forceps.       Resection and retrieval were complete. Impression:           - One 4 mm polyp in the ascending colon, removed with a                        cold snare. Resected and retrieved.                       - One 3 mm polyp in the transverse colon, removed with                        a cold biopsy forceps. Resected and retrieved.                       - Three 2 to 3 mm polyps in the descending colon,                        removed with a cold biopsy forceps. Resected and                        retrieved. Recommendation:       - Discharge patient to home.                       -  Resume previous diet.                       - Continue present medications.                       - Await pathology results.                       - Repeat colonoscopy in 4 years for surveillance. Procedure Code(s):    --- Professional ---                       [phone], Colonoscopy, flexible; with removal of tumor(s),                        polyp(s), or other  lesion(s) by snare technique                       [id number], 66, Colonoscopy, flexible; with biopsy, single                        or multiple Diagnosis Code(s):    --- Professional ---                       Z86.010, Personal history of colonic polyps                       D12.2, Benign neoplasm of ascending colon                       D12.3, Benign neoplasm of transverse colon (hepatic                        flexure or splenic flexure)                       D12.4, Benign neoplasm of descending colon CPT copyright 2016 American Medical Association. All rights reserved. The codes documented in this report are preliminary and upon coder review may  be revised to meet current compliance requirements. [name] MD, MD 11/01/2016 8:23:04 AM This report has been signed electronically. Number of Addenda: 0 Note Initiated On: 11/01/2016 8:05 AM Scope Withdrawal Time: 0 hours 9 minutes 11 seconds  Total Procedure Duration: 0 hours 11 minutes 10 seconds       [facility]

## 2016-11-01 NOTE — Anesthesia Preprocedure Evaluation (Signed)
Anesthesia Evaluation  Patient identified by MRN, date of birth, ID band Patient awake    Reviewed: Allergy & Precautions, NPO status , Patient's Chart, lab work & pertinent test results  History of Anesthesia Complications (+) PONV  Airway Mallampati: II  TM Distance: >3 FB Neck ROM: Full    Dental no notable dental hx.    Pulmonary neg pulmonary ROS, shortness of breath, sleep apnea , former smoker,    Pulmonary exam normal breath sounds clear to auscultation       Cardiovascular negative cardio ROS Normal cardiovascular exam Rhythm:Regular Rate:Normal     Neuro/Psych Anxiety Depression Bipolar Disorder negative neurological ROS  negative psych ROS   GI/Hepatic negative GI ROS, Neg liver ROS, GERD  Medicated and Controlled,  Endo/Other  negative endocrine ROS  Renal/GU negative Renal ROS  negative genitourinary   Musculoskeletal  (+) Arthritis ,   Abdominal   Peds negative pediatric ROS (+)  Hematology negative hematology ROS (+)   Anesthesia Other Findings   Reproductive/Obstetrics negative OB ROS                             Anesthesia Physical Anesthesia Plan  ASA: II  Anesthesia Plan: MAC   Post-op Pain Management:    Induction: Intravenous  Airway Management Planned:   Additional Equipment:   Intra-op Plan:   Post-operative Plan: Extubation in OR  Informed Consent: I have reviewed the patients History and Physical, chart, labs and discussed the procedure including the risks, benefits and alternatives for the proposed anesthesia with the patient or authorized representative who has indicated his/her understanding and acceptance.   Dental advisory given  Plan Discussed with: CRNA  Anesthesia Plan Comments:         Anesthesia Quick Evaluation

## 2016-11-01 NOTE — Op Note (Signed)
[name] Gastroenterology Patient Name: Denise Spencer Procedure Date: 11/01/2016 7:32 AM MRN: [id number] Account #: [id number] Date of Birth: [birth year] Admit Type: Outpatient Age: 56 Room: [facility] OR ROOM 01 Gender: Female Note Status: Finalized Procedure:            Upper GI endoscopy Indications:          Dysphagia Providers:            [name] MD, MD Referring MD:         Caprice Renshaw MD (Referring MD) Medicines:            Propofol per Anesthesia Complications:        No immediate complications. Procedure:            Pre-Anesthesia Assessment:                       - Prior to the procedure, a History and Physical was                        performed, and patient medications and allergies were                        reviewed. The patient's tolerance of previous                        anesthesia was also reviewed. The risks and benefits of                        the procedure and the sedation options and risks were                        discussed with the patient. All questions were                        answered, and informed consent was obtained. Prior                        Anticoagulants: The patient has taken no previous                        anticoagulant or antiplatelet agents. ASA Grade                        Assessment: II - A patient with mild systemic disease.                        After reviewing the risks and benefits, the patient was                        deemed in satisfactory condition to undergo the                        procedure.                       After obtaining informed consent, the endoscope was                        passed under direct vision. Throughout the procedure,  the patient's blood pressure, pulse, and oxygen                        saturations were monitored continuously. The Olympus                        190 Endoscope [id number]) was introduced through the   mouth, and advanced to the second part of duodenum. The                        upper GI endoscopy was accomplished without difficulty.                        The patient tolerated the procedure well. Findings:      The examined esophagus was normal.      The stomach was normal.      The examined duodenum was normal. Impression:           - Normal esophagus.                       - Normal stomach.                       - Normal examined duodenum.                       - No specimens collected. Recommendation:       - Discharge patient to home.                       - Resume previous diet.                       - Continue present medications.                       - Perform a colonoscopy. Procedure Code(s):    --- Professional ---                       [id number], Esophagogastroduodenoscopy, flexible, transoral;                        diagnostic, including collection of specimen(s) by                        brushing or washing, when performed (separate procedure) Diagnosis Code(s):    --- Professional ---                       R13.10, Dysphagia, unspecified CPT copyright 2016 American Medical Association. All rights reserved. The codes documented in this report are preliminary and upon coder review may  be revised to meet current compliance requirements. [name] MD, MD 11/01/2016 8:04:49 AM This report has been signed electronically. Number of Addenda: 0 Note Initiated On: 11/01/2016 7:32 AM      [facility]

## 2016-11-01 NOTE — Anesthesia Postprocedure Evaluation (Signed)
Anesthesia Post Note  Patient: Denise Spencer  Procedure(s) Performed: Procedure(s) (LRB): COLONOSCOPY WITH PROPOFOL (N/A) ESOPHAGOGASTRODUODENOSCOPY (EGD) WITH PROPOFOL (N/A) POLYPECTOMY  Patient location during evaluation: PACU Anesthesia Type: MAC Level of consciousness: awake and alert Pain management: pain level controlled Vital Signs Assessment: post-procedure vital signs reviewed and stable Respiratory status: spontaneous breathing, nonlabored ventilation, respiratory function stable and patient connected to nasal cannula oxygen Cardiovascular status: stable and blood pressure returned to baseline Anesthetic complications: no    Giannie Soliday C

## 2016-11-01 NOTE — Transfer of Care (Signed)
Immediate Anesthesia Transfer of Care Note  Patient: Denise Spencer  Procedure(s) Performed: Procedure(s): COLONOSCOPY WITH PROPOFOL (N/A) ESOPHAGOGASTRODUODENOSCOPY (EGD) WITH PROPOFOL (N/A) POLYPECTOMY  Patient Location: PACU  Anesthesia Type: MAC  Level of Consciousness: awake, alert  and patient cooperative  Airway and Oxygen Therapy: Patient Spontanous Breathing and Patient connected to supplemental oxygen  Post-op Assessment: Post-op Vital signs reviewed, Patient's Cardiovascular Status Stable, Respiratory Function Stable, Patent Airway and No signs of Nausea or vomiting  Post-op Vital Signs: Reviewed and stable  Complications: No apparent anesthesia complications

## 2016-11-02 ENCOUNTER — Encounter: Payer: Self-pay | Admitting: Gastroenterology

## 2016-11-02 ENCOUNTER — Telehealth: Payer: Self-pay | Admitting: Pulmonary Disease

## 2016-11-02 NOTE — Telephone Encounter (Signed)
done

## 2016-11-02 NOTE — Telephone Encounter (Signed)
Pt calling stating ENT wants a copy of her CT that she did Please send it to   Fax [phone] Attn : Delilah Shan

## 2016-11-03 ENCOUNTER — Encounter: Payer: Self-pay | Admitting: Pulmonary Disease

## 2016-11-03 DIAGNOSIS — M170 Bilateral primary osteoarthritis of knee: Secondary | ICD-10-CM | POA: Diagnosis not present

## 2016-11-03 DIAGNOSIS — M25562 Pain in left knee: Secondary | ICD-10-CM | POA: Diagnosis not present

## 2016-11-04 ENCOUNTER — Encounter: Payer: Self-pay | Admitting: Gastroenterology

## 2016-11-05 DIAGNOSIS — G4733 Obstructive sleep apnea (adult) (pediatric): Secondary | ICD-10-CM | POA: Diagnosis not present

## 2016-11-08 DIAGNOSIS — M25562 Pain in left knee: Secondary | ICD-10-CM | POA: Diagnosis not present

## 2016-11-10 DIAGNOSIS — M25562 Pain in left knee: Secondary | ICD-10-CM | POA: Diagnosis not present

## 2016-11-10 DIAGNOSIS — M1711 Unilateral primary osteoarthritis, right knee: Secondary | ICD-10-CM | POA: Diagnosis not present

## 2016-11-16 ENCOUNTER — Encounter: Payer: Self-pay | Admitting: Pulmonary Disease

## 2016-11-16 ENCOUNTER — Ambulatory Visit (INDEPENDENT_AMBULATORY_CARE_PROVIDER_SITE_OTHER): Payer: PPO | Admitting: Pulmonary Disease

## 2016-11-16 VITALS — BP 144/100 | HR 104 | Resp 16 | Ht 67.0 in | Wt 232.0 lb

## 2016-11-16 DIAGNOSIS — G4733 Obstructive sleep apnea (adult) (pediatric): Secondary | ICD-10-CM

## 2016-11-16 DIAGNOSIS — F17200 Nicotine dependence, unspecified, uncomplicated: Secondary | ICD-10-CM

## 2016-11-16 NOTE — Patient Instructions (Signed)
We discussed the importance of ongoing smoking abstinence. Congratulations on your success so far  Continue CPAP as prescribed  We will obtain the CPAP compliance report and share that with the ENT office  Follow-up in 4-6 months

## 2016-11-17 NOTE — Progress Notes (Signed)
PULMONARY OFFICE FOLLOW-UP NOTE  Requesting MD/Service: Beryle Quant Date of initial consultation: 08/16/16 Reason for consultation: cough, dyspnea  PT PROFILE: 56 y.o. former smoker with very mild COPD and moderate OSA  DATA: PSG 09/06/16: Moderate OSA, AHI 27/hr. 320 desaturation events. Lowest SpO2 77% PFTs 09/16/16: FVC:  3.46 L ( 100 %pred), FEV1:  2.67 L ( 95 %pred), FEV1/FVC: 77% , TLC:  5.60 L ( 101 %pred), DLCO  66 %pred, DLCO/VA normal.   SUBJ: This is a routine reevaluation. She has no new complaints. She reports that she is not smoking. However, on further questioning, she does admit to taking "a few puffs" from time to time. Notably, she smells of cigarette smoke which she attributes to secondhand smoke exposure (husband smokes). She has no significant change in a restaurant symptoms and continues to have mild exertional dyspnea. She uses albuterol infrequently. Her use has increased this spring with increased pollen exposure. She is compliant with CPAP and believes that this has made a great difference in her life with much improved daytime hypersomnolence and increased mental clarity during the day. She uses a full face mask due to chronic nasal obstruction. She is scheduled to undergo ENT procedure in the near future.    Vitals:   11/16/16 1035  BP: (!) 144/100  Pulse: (!) 104  Resp: 16  SpO2: 97%  Weight: 232 lb (105.2 kg)  Height: 5\' 7"  (1.702 m)     EXAM:  Gen: Obese, NAD HEENT: NCAT, sclera white, oropharynx normal Neck: Supple without LAN, thyromegaly, JVD Lungs: breath sounds full, No wheezes Cardiovascular: RRR, no murmurs noted Abdomen: Obese, soft, nontender, normal BS Ext: without clubbing, cyanosis, edema Neuro: grossly intact  DATA:   BMP Latest Ref Rng & Units 07/01/2016 11/30/2015 02/22/2012  Glucose 65 - 99 mg/dL 152(H) 125(H) 107(H)  BUN 6 - 20 mg/dL 6 8 8   Creatinine 0.44 - 1.00 mg/dL 0.98 1.06(H) 0.87  Sodium 135 - 145 mmol/L 136 136 140   Potassium 3.5 - 5.1 mmol/L 3.5 3.7 4.0  Chloride 101 - 111 mmol/L 97(L) 100(L) 106  CO2 22 - 32 mmol/L 29 28 29   Calcium 8.9 - 10.3 mg/dL 9.2 8.9 8.7    CBC Latest Ref Rng & Units 07/01/2016 11/30/2015 01/01/2015  WBC 3.6 - 11.0 K/uL 14.3(H) 16.5(H) 13.1(H)  Hemoglobin 12.0 - 16.0 g/dL 15.2 15.4 15.7  Hematocrit 35.0 - 47.0 % 45.1 48.4(H) 49.1(H)  Platelets 150 - 440 K/uL 443(H) 262 262    CXR (07/01/16):  NACPD  IMPRESSION:   1) Moderate OSA 2) smoker- if she is to be believed, she is smoking minimally. However, I'm concerned about the fairly strong odor of cigarette smoke on her clothing. We discussed the importance of complete abstinence from cigarette smoking and the risk of relapse. 3) very mild COPD 4) mild to moderate exertional dyspnea out of proportion to PFT findings. Likely component of deconditioning and/or obesity  PLAN:  1) Continue PRN albuterol MDI 2) continue CPAP, AutoSet 3) discussed importance of smoking abstinence in detail 6) follow-up in 4-6 months or sooner as needed  Merton Border, MD PCCM service Mobile [phone] Pager [phone] 11/17/2016

## 2016-11-18 ENCOUNTER — Telehealth: Payer: Self-pay | Admitting: Pulmonary Disease

## 2016-11-18 DIAGNOSIS — M1711 Unilateral primary osteoarthritis, right knee: Secondary | ICD-10-CM | POA: Diagnosis not present

## 2016-11-18 DIAGNOSIS — M25562 Pain in left knee: Secondary | ICD-10-CM | POA: Diagnosis not present

## 2016-11-18 NOTE — Telephone Encounter (Signed)
Pt states her ENT has not received her CPAP compliance report. Please call.

## 2016-11-19 NOTE — Telephone Encounter (Signed)
Compliance report printed and faxed to ENT.

## 2016-11-19 NOTE — Telephone Encounter (Signed)
Patient wanted to check on status.  She is aware this was faxed.

## 2016-11-29 DIAGNOSIS — R202 Paresthesia of skin: Secondary | ICD-10-CM | POA: Diagnosis not present

## 2016-11-29 DIAGNOSIS — Z79899 Other long term (current) drug therapy: Secondary | ICD-10-CM | POA: Diagnosis not present

## 2016-12-06 DIAGNOSIS — G4733 Obstructive sleep apnea (adult) (pediatric): Secondary | ICD-10-CM | POA: Diagnosis not present

## 2016-12-09 DIAGNOSIS — H6123 Impacted cerumen, bilateral: Secondary | ICD-10-CM | POA: Diagnosis not present

## 2016-12-09 DIAGNOSIS — J342 Deviated nasal septum: Secondary | ICD-10-CM | POA: Diagnosis not present

## 2016-12-09 DIAGNOSIS — J3489 Other specified disorders of nose and nasal sinuses: Secondary | ICD-10-CM | POA: Diagnosis not present

## 2016-12-09 DIAGNOSIS — J343 Hypertrophy of nasal turbinates: Secondary | ICD-10-CM | POA: Diagnosis not present

## 2016-12-17 ENCOUNTER — Encounter
Admission: RE | Admit: 2016-12-17 | Discharge: 2016-12-17 | Disposition: A | Discharge status: Home / Self Care | Payer: PPO | Source: Ambulatory Visit | Attending: Otolaryngology | Admitting: Otolaryngology

## 2016-12-17 DIAGNOSIS — Z01812 Encounter for preprocedural laboratory examination: Secondary | ICD-10-CM | POA: Insufficient documentation

## 2016-12-17 DIAGNOSIS — J3489 Other specified disorders of nose and nasal sinuses: Secondary | ICD-10-CM | POA: Insufficient documentation

## 2016-12-17 DIAGNOSIS — Z9989 Dependence on other enabling machines and devices: Secondary | ICD-10-CM | POA: Insufficient documentation

## 2016-12-17 HISTORY — DX: Fibromyalgia: M79.7

## 2016-12-17 HISTORY — DX: Chronic obstructive pulmonary disease, unspecified: J44.9

## 2016-12-17 NOTE — Patient Instructions (Signed)
  Your procedure is scheduled on: 12-27-16 MONDAY Report to Same Day Surgery 2nd floor medical mall [facility] Entrance-take elevator on left to 2nd floor.  Check in with surgery information desk.) To find out your arrival time please call [phone] between 1PM - 3PM on 12-24-16 FRIDAY  Remember: Instructions that are not followed completely may result in serious medical risk, up to and including death, or upon the discretion of your surgeon and anesthesiologist your surgery may need to be rescheduled.    _x___ 1. Do not eat food or drink liquids after midnight. No gum chewing or hard candies.     __x__ 2. No Alcohol for 24 hours before or after surgery.   __x__3. No Smoking for 24 prior to surgery.   ____  4. Bring all medications with you on the day of surgery if instructed.    __x__ 5. Notify your doctor if there is any change in your medical condition     (cold, fever, infections).     Do not wear jewelry, make-up, hairpins, clips or nail polish.  Do not wear lotions, powders, or perfumes. You may wear deodorant.  Do not shave 48 hours prior to surgery. Men may shave face and neck.  Do not bring valuables to the hospital.    [facility] is not responsible for any belongings or valuables.               Contacts, dentures or bridgework may not be worn into surgery.  Leave your suitcase in the car. After surgery it may be brought to your room.  For patients admitted to the hospital, discharge time is determined by your treatment team.   Patients discharged the day of surgery will not be allowed to drive home.  You will need someone to drive you home and stay with you the night of your procedure.    Please read over the following fact sheets that you were given:     _x___ [place] WITH A SMALL SIP OF WATER. These include:  1. GABAPENTIN  2. CLONAZEPAM  3. PROZAC  4. PROTONIX  5.  6.  ____Fleets enema or Magnesium Citrate as  directed.   ____ Use CHG Soap or sage wipes as directed on instruction sheet   _X___ Use inhalers on the day of surgery and bring to hospital day of surgery-USE ALBUTEROL INHALER AT [place]  ____ Stop Metformin and Janumet 2 days prior to surgery.    ____ Take 1/2 of usual insulin dose the night before surgery and none on the morning surgery.   ____ Follow recommendations from Cardiologist, Pulmonologist or PCP regarding stopping Aspirin, Coumadin, Pllavix ,Eliquis, Effient, or Pradaxa, and Pletal.  X____Stop Anti-inflammatories such as Advil, Aleve, Ibuprofen, Motrin, Naproxen, Naprosyn, Goodies powders or aspirin products NOW-OK to take Tylenol    _x___ Stop supplements until after surgery   _X___ Bring C-Pap to the hospital.

## 2016-12-20 ENCOUNTER — Encounter
Admission: RE | Admit: 2016-12-20 | Discharge: 2016-12-20 | Disposition: A | Discharge status: Home / Self Care | Payer: PPO | Source: Ambulatory Visit | Attending: Otolaryngology | Admitting: Otolaryngology

## 2016-12-20 DIAGNOSIS — Z01812 Encounter for preprocedural laboratory examination: Secondary | ICD-10-CM | POA: Diagnosis not present

## 2016-12-20 DIAGNOSIS — J3489 Other specified disorders of nose and nasal sinuses: Secondary | ICD-10-CM | POA: Diagnosis not present

## 2016-12-20 DIAGNOSIS — Z9989 Dependence on other enabling machines and devices: Secondary | ICD-10-CM | POA: Diagnosis not present

## 2016-12-20 LAB — BASIC METABOLIC PANEL
Anion gap: 6 (ref 5–15)
BUN: 7 mg/dL (ref 6–20)
CHLORIDE: 99 mmol/L — AB (ref 101–111)
CO2: 31 mmol/L (ref 22–32)
CREATININE: 0.99 mg/dL (ref 0.44–1.00)
Calcium: 9.3 mg/dL (ref 8.9–10.3)
GFR calc Af Amer: >60 mL/min (ref >60)
GFR calc non Af Amer: >60 mL/min (ref >60)
Glucose, Bld: 123 mg/dL — ABNORMAL HIGH (ref 65–99)
POTASSIUM: 3.6 mmol/L (ref 3.5–5.1)
SODIUM: 136 mmol/L (ref 135–145)

## 2016-12-20 LAB — CBC
HEMATOCRIT: 40.8 % (ref 35.0–47.0)
Hemoglobin: 13.5 g/dL (ref 12.0–16.0)
MCH: 27.9 pg (ref 26.0–34.0)
MCHC: 33.1 g/dL (ref 32.0–36.0)
MCV: 84.3 fL (ref 80.0–100.0)
Platelets: 247 10*3/uL (ref 150–440)
RBC: 4.83 MIL/uL (ref 3.80–5.20)
RDW: 15.2 % — ABNORMAL HIGH (ref 11.5–14.5)
WBC: 10 10*3/uL (ref 3.6–11.0)

## 2016-12-24 NOTE — Pre-Procedure Instructions (Signed)
PULMONARY CLEARANCE ON CHART FROM DR Lilli Light RISK MORE DUE TO OSA THAN COPD WHICH IS MILD"

## 2016-12-27 ENCOUNTER — Ambulatory Visit
Admission: RE | Admit: 2016-12-27 | Discharge: 2016-12-27 | Disposition: A | Discharge status: Home / Self Care | Payer: PPO | Source: Ambulatory Visit | Attending: Otolaryngology | Admitting: Otolaryngology

## 2016-12-27 ENCOUNTER — Ambulatory Visit: Payer: PPO | Admitting: Anesthesiology

## 2016-12-27 ENCOUNTER — Encounter: Payer: Self-pay | Admitting: *Deleted

## 2016-12-27 ENCOUNTER — Encounter
Admission: RE | Disposition: A | Discharge status: Home / Self Care | Payer: Self-pay | Source: Ambulatory Visit | Attending: Otolaryngology

## 2016-12-27 DIAGNOSIS — Z79899 Other long term (current) drug therapy: Secondary | ICD-10-CM | POA: Diagnosis not present

## 2016-12-27 DIAGNOSIS — E669 Obesity, unspecified: Secondary | ICD-10-CM | POA: Insufficient documentation

## 2016-12-27 DIAGNOSIS — M797 Fibromyalgia: Secondary | ICD-10-CM | POA: Diagnosis not present

## 2016-12-27 DIAGNOSIS — J342 Deviated nasal septum: Secondary | ICD-10-CM | POA: Insufficient documentation

## 2016-12-27 DIAGNOSIS — Z87891 Personal history of nicotine dependence: Secondary | ICD-10-CM | POA: Diagnosis not present

## 2016-12-27 DIAGNOSIS — K219 Gastro-esophageal reflux disease without esophagitis: Secondary | ICD-10-CM | POA: Diagnosis not present

## 2016-12-27 DIAGNOSIS — F419 Anxiety disorder, unspecified: Secondary | ICD-10-CM | POA: Diagnosis not present

## 2016-12-27 DIAGNOSIS — Z803 Family history of malignant neoplasm of breast: Secondary | ICD-10-CM | POA: Diagnosis not present

## 2016-12-27 DIAGNOSIS — M109 Gout, unspecified: Secondary | ICD-10-CM | POA: Insufficient documentation

## 2016-12-27 DIAGNOSIS — G4730 Sleep apnea, unspecified: Secondary | ICD-10-CM | POA: Insufficient documentation

## 2016-12-27 DIAGNOSIS — Z6837 Body mass index (BMI) 37.0-37.9, adult: Secondary | ICD-10-CM | POA: Diagnosis not present

## 2016-12-27 DIAGNOSIS — J449 Chronic obstructive pulmonary disease, unspecified: Secondary | ICD-10-CM | POA: Diagnosis not present

## 2016-12-27 DIAGNOSIS — Z882 Allergy status to sulfonamides status: Secondary | ICD-10-CM | POA: Diagnosis not present

## 2016-12-27 DIAGNOSIS — Z881 Allergy status to other antibiotic agents status: Secondary | ICD-10-CM | POA: Diagnosis not present

## 2016-12-27 DIAGNOSIS — Z820 Family history of epilepsy and other diseases of the nervous system: Secondary | ICD-10-CM | POA: Diagnosis not present

## 2016-12-27 DIAGNOSIS — Z880 Allergy status to penicillin: Secondary | ICD-10-CM | POA: Insufficient documentation

## 2016-12-27 DIAGNOSIS — F319 Bipolar disorder, unspecified: Secondary | ICD-10-CM | POA: Insufficient documentation

## 2016-12-27 DIAGNOSIS — Z807 Family history of other malignant neoplasms of lymphoid, hematopoietic and related tissues: Secondary | ICD-10-CM | POA: Diagnosis not present

## 2016-12-27 DIAGNOSIS — J343 Hypertrophy of nasal turbinates: Secondary | ICD-10-CM | POA: Insufficient documentation

## 2016-12-27 DIAGNOSIS — Z808 Family history of malignant neoplasm of other organs or systems: Secondary | ICD-10-CM | POA: Diagnosis not present

## 2016-12-27 DIAGNOSIS — Z8249 Family history of ischemic heart disease and other diseases of the circulatory system: Secondary | ICD-10-CM | POA: Insufficient documentation

## 2016-12-27 HISTORY — PX: SEPTOPLASTY: SHX2393

## 2016-12-27 SURGERY — SEPTOPLASTY, NOSE
Anesthesia: General | Laterality: Left

## 2016-12-27 MED ORDER — DEXAMETHASONE SODIUM PHOSPHATE 10 MG/ML IJ SOLN
INTRAMUSCULAR | Status: DC | PRN
  Administered 2016-12-27: 10 mg via INTRAVENOUS

## 2016-12-27 MED ORDER — LIDOCAINE HCL (PF) 4 % IJ SOLN
INTRAMUSCULAR | Status: AC
  Filled 2016-12-27: qty 5

## 2016-12-27 MED ORDER — HYDROCODONE-ACETAMINOPHEN 5-325 MG PO TABS: 1.0000 | ORAL_TABLET | Freq: Four times a day (QID) | ORAL | Status: DC | PRN

## 2016-12-27 MED ORDER — SUCCINYLCHOLINE CHLORIDE 20 MG/ML IJ SOLN
INTRAMUSCULAR | Status: AC
Start: 2016-12-27 — End: ?
  Filled 2016-12-27: qty 1

## 2016-12-27 MED ORDER — MIDAZOLAM HCL 2 MG/2ML IJ SOLN
INTRAMUSCULAR | Status: AC
  Filled 2016-12-27: qty 2

## 2016-12-27 MED ORDER — ROCURONIUM BROMIDE 50 MG/5ML IV SOLN
INTRAVENOUS | Status: AC
  Filled 2016-12-27: qty 1

## 2016-12-27 MED ORDER — LIDOCAINE-EPINEPHRINE (PF) 1 %-1:200000 IJ SOLN
INTRAMUSCULAR | Status: DC | PRN
  Administered 2016-12-27: 3 mL

## 2016-12-27 MED ORDER — FENTANYL CITRATE (PF) 100 MCG/2ML IJ SOLN
INTRAMUSCULAR | Status: DC | PRN
  Administered 2016-12-27 (×2): 50 ug via INTRAVENOUS

## 2016-12-27 MED ORDER — DEXAMETHASONE SODIUM PHOSPHATE 10 MG/ML IJ SOLN
INTRAMUSCULAR | Status: AC
  Filled 2016-12-27: qty 1

## 2016-12-27 MED ORDER — PHENYLEPHRINE HCL 10 MG/ML IJ SOLN
INTRAMUSCULAR | Status: AC
  Filled 2016-12-27: qty 1

## 2016-12-27 MED ORDER — LACTATED RINGERS IV SOLN
INTRAVENOUS | Status: DC
  Administered 2016-12-27: 09:00:00 via INTRAVENOUS

## 2016-12-27 MED ORDER — ONDANSETRON HCL 4 MG/2ML IJ SOLN
INTRAMUSCULAR | Status: AC
  Filled 2016-12-27: qty 2

## 2016-12-27 MED ORDER — HYDROCODONE-ACETAMINOPHEN 7.5-325 MG PO TABS: 1.0000 | ORAL_TABLET | Freq: Four times a day (QID) | ORAL | Status: DC | PRN

## 2016-12-27 MED ORDER — PROPOFOL 10 MG/ML IV BOLUS
INTRAVENOUS | Status: DC | PRN
  Administered 2016-12-27: 180 mg via INTRAVENOUS

## 2016-12-27 MED ORDER — SUGAMMADEX SODIUM 500 MG/5ML IV SOLN
INTRAVENOUS | Status: AC
  Filled 2016-12-27: qty 5

## 2016-12-27 MED ORDER — MIDAZOLAM HCL 2 MG/2ML IJ SOLN
INTRAMUSCULAR | Status: DC | PRN
  Administered 2016-12-27: 2 mg via INTRAVENOUS

## 2016-12-27 MED ORDER — ROCURONIUM BROMIDE 100 MG/10ML IV SOLN
INTRAVENOUS | Status: DC | PRN
  Administered 2016-12-27: 20 mg via INTRAVENOUS
  Administered 2016-12-27: 40 mg via INTRAVENOUS

## 2016-12-27 MED ORDER — PHENYLEPHRINE HCL 10 % OP SOLN
OPHTHALMIC | Status: DC | PRN
  Administered 2016-12-27: 10 mL via TOPICAL

## 2016-12-27 MED ORDER — LIDOCAINE HCL (PF) 2 % IJ SOLN
INTRAMUSCULAR | Status: AC
  Filled 2016-12-27: qty 2

## 2016-12-27 MED ORDER — LIDOCAINE-EPINEPHRINE (PF) 1 %-1:200000 IJ SOLN
INTRAMUSCULAR | Status: AC
  Filled 2016-12-27: qty 30

## 2016-12-27 MED ORDER — LIDOCAINE HCL (CARDIAC) 20 MG/ML IV SOLN
INTRAVENOUS | Status: DC | PRN
  Administered 2016-12-27: 50 mg via INTRAVENOUS

## 2016-12-27 MED ORDER — FENTANYL CITRATE (PF) 100 MCG/2ML IJ SOLN: 25.0000 ug | INTRAMUSCULAR | Status: DC | PRN

## 2016-12-27 MED ORDER — FENTANYL CITRATE (PF) 100 MCG/2ML IJ SOLN
INTRAMUSCULAR | Status: AC
  Filled 2016-12-27: qty 2

## 2016-12-27 MED ORDER — SUGAMMADEX SODIUM 500 MG/5ML IV SOLN
INTRAVENOUS | Status: DC | PRN
  Administered 2016-12-27: 220 mg via INTRAVENOUS

## 2016-12-27 MED ORDER — HYDROCODONE-ACETAMINOPHEN 10-325 MG PO TABS
ORAL_TABLET | ORAL | Status: AC
  Administered 2016-12-27: 1
  Filled 2016-12-27: qty 1

## 2016-12-27 MED ORDER — SUCCINYLCHOLINE CHLORIDE 20 MG/ML IJ SOLN
INTRAMUSCULAR | Status: DC | PRN
  Administered 2016-12-27: 100 mg via INTRAVENOUS

## 2016-12-27 MED ORDER — ONDANSETRON HCL 4 MG/2ML IJ SOLN
INTRAMUSCULAR | Status: DC | PRN
  Administered 2016-12-27: 4 mg via INTRAVENOUS

## 2016-12-27 MED ORDER — PROPOFOL 10 MG/ML IV BOLUS
INTRAVENOUS | Status: AC
  Filled 2016-12-27: qty 20

## 2016-12-27 SURGICAL SUPPLY — 32 items
BATTERY INSTRU NAVIGATION (MISCELLANEOUS) IMPLANT
CANISTER SUCT 1200ML W/VALVE (MISCELLANEOUS) ×2 IMPLANT
CANISTER SUCT 3000ML PPV (MISCELLANEOUS) ×2 IMPLANT
COAG SUCT 10F 3.5MM HAND CTRL (MISCELLANEOUS) ×2 IMPLANT
CUP MEDICINE 2OZ PLAST GRAD ST (MISCELLANEOUS) ×2 IMPLANT
DRESSING NASL FOAM PST OP SINU (MISCELLANEOUS) IMPLANT
DRSG NASAL FOAM POST OP SINU (MISCELLANEOUS)
ELECT REM PT RETURN 9FT ADLT (ELECTROSURGICAL) ×2
ELECTRODE REM PT RTRN 9FT ADLT (ELECTROSURGICAL) ×1 IMPLANT
GLOVE PROTEXIS LATEX SZ 7.5 (GLOVE) ×4 IMPLANT
GOWN STRL REUS W/ TWL LRG LVL3 (GOWN DISPOSABLE) ×2 IMPLANT
GOWN STRL REUS W/TWL LRG LVL3 (GOWN DISPOSABLE) ×2
IV NS 500ML (IV SOLUTION) ×1
IV NS 500ML BAXH (IV SOLUTION) ×1 IMPLANT
NAVIGATION MASK REG  ST (MISCELLANEOUS) IMPLANT
NEEDLE SPNL 25GX3.5 QUINCKE BL (NEEDLE) ×2 IMPLANT
NS IRRIG 500ML POUR BTL (IV SOLUTION) ×2 IMPLANT
PACK HEAD/NECK (MISCELLANEOUS) ×2 IMPLANT
PACKING NASAL EPIS 4X2.4 XEROG (MISCELLANEOUS) ×2 IMPLANT
PATTIES SURGICAL .5 X3 (DISPOSABLE) ×2 IMPLANT
SHAVER DIEGO BLD STD TYPE A (BLADE) IMPLANT
SOL ANTI-FOG 6CC FOG-OUT (MISCELLANEOUS) ×1 IMPLANT
SOL FOG-OUT ANTI-FOG 6CC (MISCELLANEOUS) ×1
SPLINT NASAL REUTER .5MM (MISCELLANEOUS) ×2 IMPLANT
SPOGE SURGIFLO 8M (HEMOSTASIS)
SPONGE SURGIFLO 8M (HEMOSTASIS) IMPLANT
SUT CHROMIC 3 0 SH 27 (SUTURE) ×2 IMPLANT
SWAB CULTURE AMIES ANAERIB BLU (MISCELLANEOUS) IMPLANT
SYR 20CC LL (SYRINGE) ×2 IMPLANT
SYR 3ML LL SCALE MARK (SYRINGE) ×2 IMPLANT
TUBING DECLOG MULTIDEBRIDER (TUBING) IMPLANT
WATER STERILE IRR 1000ML POUR (IV SOLUTION) IMPLANT

## 2016-12-27 NOTE — OR Nursing (Signed)
New scripts given to spouse by MD - per spouse prednisone, hydrocodone and antibiotic

## 2016-12-27 NOTE — Anesthesia Preprocedure Evaluation (Signed)
Anesthesia Evaluation  Patient identified by MRN, date of birth, ID band Patient awake    Reviewed: Allergy & Precautions, NPO status , Patient's Chart, lab work & pertinent test results, reviewed documented beta blocker date and time   History of Anesthesia Complications (+) PONV and history of anesthetic complications  Airway Mallampati: III  TM Distance: >3 FB     Dental  (+) Chipped   Pulmonary shortness of breath, sleep apnea and Continuous Positive Airway Pressure Ventilation , COPD, former smoker,           Cardiovascular      Neuro/Psych PSYCHIATRIC DISORDERS Anxiety Depression Bipolar Disorder    GI/Hepatic GERD  Controlled,  Endo/Other    Renal/GU      Musculoskeletal  (+) Arthritis , Fibromyalgia -  Abdominal   Peds  Hematology   Anesthesia Other Findings Gout.Obese.  Reproductive/Obstetrics                             Anesthesia Physical Anesthesia Plan  ASA: III  Anesthesia Plan: General   Post-op Pain Management:    Induction: Intravenous  PONV Risk Score and Plan:   Airway Management Planned: Oral ETT  Additional Equipment:   Intra-op Plan:   Post-operative Plan:   Informed Consent: I have reviewed the patients History and Physical, chart, labs and discussed the procedure including the risks, benefits and alternatives for the proposed anesthesia with the patient or authorized representative who has indicated his/her understanding and acceptance.     Plan Discussed with: CRNA  Anesthesia Plan Comments:         Anesthesia Quick Evaluation

## 2016-12-27 NOTE — Discharge Instructions (Addendum)
Septoplasty, Care After Refer to this sheet in the next few weeks. These instructions provide you with information about caring for yourself after your procedure. Your health care provider may also give you more specific instructions. Your treatment has been planned according to current medical practices, but problems sometimes occur. Call your health care provider if you have any problems or questions after your procedure. What can I expect after the procedure? After your procedure, it is typical to have the following:  Mild headache.  Stuffy nose.  Feeling of fullness in your ears.  Bloody fluid coming from your nose.  Follow these instructions at home: Medicines  If you were prescribed an antibiotic medicine, finish it all even if you start to feel better.  Take medicines only as directed by your health care provider.  You may be directed to clean your nostrils with a saline nasal spray. This will help to clear the crusts and blood clots in your nose. The solution can be found as an over-the-counter solution or made at home as directed by your health care provider.  You may need to take laxatives or stool softeners as directed by your health care provider. What to Avoid  Do not blow your nose for 2 weeks after surgery or as directed by your health care provider.  Do not lift anything heavier than 10 lb (4.5 kg) for 2 weeks or as directed by your health care provider.  Avoid strenuous activities that can cause nosebleeds for 2 weeks. These include activities such as running or playing sports.  Avoid very hot or steamy showers for several days after surgery or as directed by your health care provider. Eating and drinking  Avoid eating hot and spicy foods for several days after surgery or as directed by your health care provider.  Eat plenty of fiber to keep your stools soft, especially while you are taking pain medicine. This helps you to avoid straining, which can cause a  nosebleed.  Drink enough fluid to keep your urine clear or pale yellow. General instructions  Raise (elevate) your head while you are lying down.  Keep all follow-up visits as directed by your health care provider. This is important.  If you have nasal splints, follow your health care provider's instructions about removal.  If your nose was packed with gauze, follow your health care provider's instructions about removal. Contact a health care provider if:  You develop swelling or increased pain in your nose.  You have yellowish-white fluid (pus) coming from your nose.  You have severe diarrhea.  You have ongoing (persistent) nausea.  You cannot breathe through your nose.  You have a fever. Get help right away if:  You are short of breath.  You feel dizzy or you faint.  You have vision changes.  You are bleeding heavily from your nose.  You are vomiting.  You have a severe headache or a stiff neck. This information is not intended to replace advice given to you by your health care provider. Make sure you discuss any questions you have with your health care provider. Document Released: 06/07/2005 Document Revised: 01/31/2016 Document Reviewed: 01/16/2014 Elsevier Interactive Patient Education  2018 [place]   1) The drugs that you were given will stay in your system until tomorrow so for the next 24 hours you should not:  A) Drive an automobile B) Make any legal decisions C) Drink any alcoholic beverage   2) You may  resume regular meals tomorrow.  Today it is better to start with liquids and gradually work up to solid foods.  You may eat anything you prefer, but it is better to start with liquids, then soup and crackers, and gradually work up to solid foods.   3) Please notify your doctor immediately if you have any unusual bleeding, trouble breathing, redness and pain at the surgery site, drainage, fever,  or pain not relieved by medication.    4) Additional Instructions:        Please contact your physician with any problems or Same Day Surgery at [phone], Monday through Friday 6 am to 4 pm, or [place] at [facility] number at [phone].

## 2016-12-27 NOTE — Op Note (Signed)
12/27/2016  10:14 AM  [id number]   Pre-Op Dx:  Deviated Nasal Septum, Hypertrophic left middle Turbinate, hypertrophy I lateral inferior turbinates  Post-op Dx: Same  Proc: Nasal Septoplasty, Bilateral endoscopic Partial Reduction left middle turbinate, and partial reduction bilateral Inferior Turbinates   Surg:  [name]  Anes:  GOT  EBL:  100 mL  Comp:  None  Findings: Septum markedly deviated to the right side. The left middle turbinate was very hypertrophied. The inferior turbinates were enlarged as well on both sides more so on the right.  Procedure: With the patient in a comfortable supine position,  general orotracheal anesthesia was induced without difficulty.     The patient received preoperative Afrin spray for topical decongestion and vasoconstriction.  Intravenous prophylactic antibiotics were administered.  At an appropriate level, the patient was placed in a semi-sitting position.  Nasal vibrissae were trimmed.   1% Xylocaine with 1:100,000 epinephrine, 6 cc's, was infiltrated into the anterior floor of the nose, into the nasal spine region, into the membranous columella, and finally into the submucoperichondrial plane of the septum on both sides.  Several minutes were allowed for this to take effect.  Cottoniod pledgetts soaked in Afrin and 4% Xylocaine were placed into both nasal cavities and left while the patient was prepped and draped in the standard fashion.  The materials were removed from the nose and observed to be intact and correct in number.  The nose was inspected with a headlight and the 0 scope with the findings as described above.  A left hemitransfixion incision was sharply executed and carried down to the caudal edge of the quadrangular cartilage. The mucoperichondrium was elelvated along the quadrangular plate back to the bony-cartilaginous junction along both sides.. The mucoperiostium was then elevated along the ethmoid plate and the vomer. The  boney-catilaginous junction was then split with a freer elevator and the mucoperiosteum was elevated on the opposite side. The mucoperiosteum was then elevated along the maxillary crest as needed to expose the crooked bone of the crest.  Boney spurs of the vomer and maxillary crest were removed with Donavan Foil forceps.  The cartilaginous plate was trimmed along its posterior and inferior borders of about 2 mm of cartilage to free it up inferiorly. Some of the deviated ethmoid plate was then fractured and removed with Takahashi forceps to free up the posterior border of the quadrangular plate and allow it to swing back to the midline. The mucosal flaps were placed back into their anatomic position to allow visualization of the airways. The septum now sat in the midline with an improved airway.  A 3-0 Chromic suture on a Keith needle in used to anchor the inferior septum at the nasal spine with a through and through suture. The mucosal flaps are then sutured together using a through and through whip stitch of 4-0 Plain Gut with a mini-Keith needle. This was used to close the [place] incision as well.   The 0 scope was used to visualize the upper airway on both sides. Inferiorly the turbinates where a little bit enlarged but the right middle turbinate was small and had been pinched. The left middle turbinate was well overgrown. The left middle turbinate was trimmed using the 0 scope and Gruenwald forceps. This was trimmed along its anterior inferior border to thin it and create more room into the middle meatus and inferiorly to it. There is a lot of bleeding along its trimmed edge and electrocautery was used here to help control the  bleeding. Cottonoid pledgets were placed or vasoconstriction.  The inferior turbinates were then inspected. There were outfractured on both sides. The right side was somewhat enlarged and electrocautery was used along its posterior and inferior border to help shrink the posterior  turbinate.  The airways were then visualized and showed open passageways on both sides that were significantly improved compared to before surgery. There was no signifcant bleeding. Nasal splints were applied to both sides of the septum using Xomed 0.48mm regular sized splints that were trimmed, and then held in position with a 3-0 Nylon through and through suture. Xerogel was then placed over the left middle turbinate where it had been trimmed for helping with coagulation keep it moist.  The patient was turned back over to anesthesia, and awakened, extubated, and taken to the PACU in satisfactory condition.  Dispo:   PACU to home  Plan: Ice, elevation, narcotic analgesia, steroid taper, and prophylactic antibiotics for the duration of indwelling nasal foreign bodies.  We will reevaluate the patient in the office in 7 days and remove the septal splints.  Return to work in 10 days, strenuous activities in two weeks.   Denise Spencer H 12/27/2016 10:14 AM

## 2016-12-27 NOTE — Anesthesia Post-op Follow-up Note (Cosign Needed)
Anesthesia QCDR form completed.        

## 2016-12-27 NOTE — Transfer of Care (Signed)
Immediate Anesthesia Transfer of Care Note  Patient: Denise Spencer  Procedure(s) Performed: Procedure(s): [place] (Left)  Patient Location: PACU  Anesthesia Type:General  Level of Consciousness: awake, alert , oriented and patient cooperative  Airway & Oxygen Therapy: Patient Spontanous Breathing and Patient connected to face mask oxygen  Post-op Assessment: Report given to RN, Post -op Vital signs reviewed and stable and Patient moving all extremities X 4  Post vital signs: Reviewed and stable  Last Vitals:  Vitals:   12/27/16 0724 12/27/16 1030  BP: (!) 157/85 (!) 162/91  Pulse: 94 (!) 102  Resp: 16 17  Temp: 36.9 C 36.6 C    Last Pain:  Vitals:   12/27/16 0724  TempSrc: Oral         Complications: No apparent anesthesia complications

## 2016-12-27 NOTE — Anesthesia Procedure Notes (Signed)
Procedure Name: Intubation Performed by: Silvana Newness Pre-anesthesia Checklist: Patient identified, Patient being monitored, Timeout performed, Emergency Drugs available and Suction available Patient Re-evaluated:Patient Re-evaluated prior to inductionOxygen Delivery Method: Circle system utilized Preoxygenation: Pre-oxygenation with 100% oxygen Intubation Type: IV induction and Rapid sequence Ventilation: Mask ventilation without difficulty Laryngoscope Size: Miller and 2 Grade View: Grade I Tube type: Oral Rae Tube size: 7.0 mm Number of attempts: 1 Airway Equipment and Method: Stylet Placement Confirmation: ETT inserted through vocal cords under direct vision,  positive ETCO2 and breath sounds checked- equal and bilateral Secured at: 21 cm Tube secured with: Tape Dental Injury: Teeth and Oropharynx as per pre-operative assessment

## 2016-12-27 NOTE — Anesthesia Postprocedure Evaluation (Signed)
Anesthesia Post Note  Patient: Denise Spencer  Procedure(s) Performed: Procedure(s) (LRB): [place] (Left)  Patient location during evaluation: PACU Anesthesia Type: General Level of consciousness: awake and alert Pain management: pain level controlled Vital Signs Assessment: post-procedure vital signs reviewed and stable Respiratory status: spontaneous breathing, nonlabored ventilation, respiratory function stable and patient connected to nasal cannula oxygen Cardiovascular status: blood pressure returned to baseline and stable Postop Assessment: no signs of nausea or vomiting Anesthetic complications: no     Last Vitals:  Vitals:   12/27/16 1120 12/27/16 1144  BP: (!) 153/69 (!) 145/83  Pulse: (!) 101 94  Resp: 14 14  Temp: 36.4 C     Last Pain:  Vitals:   12/27/16 1157  TempSrc:   PainSc: [place]

## 2016-12-28 ENCOUNTER — Encounter: Payer: Self-pay | Admitting: Otolaryngology

## 2016-12-31 DIAGNOSIS — Z48813 Encounter for surgical aftercare following surgery on the respiratory system: Secondary | ICD-10-CM | POA: Diagnosis not present

## 2017-01-05 DIAGNOSIS — G4733 Obstructive sleep apnea (adult) (pediatric): Secondary | ICD-10-CM | POA: Diagnosis not present

## 2017-01-07 DIAGNOSIS — Z48813 Encounter for surgical aftercare following surgery on the respiratory system: Secondary | ICD-10-CM | POA: Diagnosis not present

## 2017-01-13 DIAGNOSIS — Z48813 Encounter for surgical aftercare following surgery on the respiratory system: Secondary | ICD-10-CM | POA: Diagnosis not present

## 2017-01-19 DIAGNOSIS — M1712 Unilateral primary osteoarthritis, left knee: Secondary | ICD-10-CM | POA: Diagnosis not present

## 2017-01-28 DIAGNOSIS — Z48813 Encounter for surgical aftercare following surgery on the respiratory system: Secondary | ICD-10-CM | POA: Diagnosis not present

## 2017-02-05 DIAGNOSIS — G4733 Obstructive sleep apnea (adult) (pediatric): Secondary | ICD-10-CM | POA: Diagnosis not present

## 2017-02-07 ENCOUNTER — Other Ambulatory Visit: Payer: Self-pay | Admitting: Psychiatry

## 2017-02-07 DIAGNOSIS — Z79899 Other long term (current) drug therapy: Secondary | ICD-10-CM | POA: Diagnosis not present

## 2017-02-07 DIAGNOSIS — E7800 Pure hypercholesterolemia, unspecified: Secondary | ICD-10-CM | POA: Diagnosis not present

## 2017-02-07 DIAGNOSIS — E538 Deficiency of other specified B group vitamins: Secondary | ICD-10-CM | POA: Diagnosis not present

## 2017-02-14 DIAGNOSIS — F419 Anxiety disorder, unspecified: Secondary | ICD-10-CM | POA: Diagnosis not present

## 2017-02-14 DIAGNOSIS — G4733 Obstructive sleep apnea (adult) (pediatric): Secondary | ICD-10-CM | POA: Insufficient documentation

## 2017-02-14 DIAGNOSIS — R739 Hyperglycemia, unspecified: Secondary | ICD-10-CM | POA: Diagnosis not present

## 2017-02-14 DIAGNOSIS — Z9989 Dependence on other enabling machines and devices: Secondary | ICD-10-CM | POA: Diagnosis not present

## 2017-02-14 DIAGNOSIS — R748 Abnormal levels of other serum enzymes: Secondary | ICD-10-CM | POA: Diagnosis not present

## 2017-02-14 DIAGNOSIS — E7800 Pure hypercholesterolemia, unspecified: Secondary | ICD-10-CM | POA: Diagnosis not present

## 2017-02-14 DIAGNOSIS — F5101 Primary insomnia: Secondary | ICD-10-CM | POA: Diagnosis not present

## 2017-02-14 DIAGNOSIS — Z79899 Other long term (current) drug therapy: Secondary | ICD-10-CM | POA: Diagnosis not present

## 2017-02-18 DIAGNOSIS — Z48813 Encounter for surgical aftercare following surgery on the respiratory system: Secondary | ICD-10-CM | POA: Diagnosis not present

## 2017-02-19 ENCOUNTER — Other Ambulatory Visit: Payer: Self-pay | Admitting: Psychiatry

## 2017-02-23 ENCOUNTER — Encounter: Payer: Self-pay | Admitting: Diagnostic Neuroimaging

## 2017-02-23 ENCOUNTER — Ambulatory Visit (INDEPENDENT_AMBULATORY_CARE_PROVIDER_SITE_OTHER): Payer: PPO | Admitting: Diagnostic Neuroimaging

## 2017-02-23 VITALS — BP 165/100 | HR 106 | Ht 67.0 in | Wt 241.0 lb

## 2017-02-23 DIAGNOSIS — R269 Unspecified abnormalities of gait and mobility: Secondary | ICD-10-CM

## 2017-02-23 DIAGNOSIS — R200 Anesthesia of skin: Secondary | ICD-10-CM | POA: Diagnosis not present

## 2017-02-23 DIAGNOSIS — M62838 Other muscle spasm: Secondary | ICD-10-CM | POA: Diagnosis not present

## 2017-02-23 NOTE — Patient Instructions (Addendum)
Thank you for coming to see [name] at [facility]. I hope we have been able to provide you high quality care today.  You may receive a patient satisfaction survey over the next few weeks. We would appreciate your feedback and comments so that we may continue to improve ourselves and the health of our patients.  - I will check MRI scans  - continue B12 replacement  - continue sleep apnea treatment    ~~~~~~~~~~~~~~~~~~~~~~~~~~~~~~~~~~~~~~~~~~~~~~~~~~~~~~~~~~~~~~~~~  DR. Ashmi Blas'S GUIDE TO HAPPY AND HEALTHY LIVING These are some of my general health and wellness recommendations. Some of them may apply to you better than others. Please use common sense as you try these suggestions and feel free to ask me any questions.   ACTIVITY/FITNESS Mental, social, emotional and physical stimulation are very important for brain and body health. Try learning a new activity (arts, music, language, sports, games).  Keep moving your body to the best of your abilities. You can do this at home, inside or outside, the park, community center, gym or anywhere you like. Consider a physical therapist or personal trainer to get started. Consider the app Sworkit. Fitness trackers such as smart-watches, smart-phones or Fitbits can help as well.   NUTRITION Eat more plants: colorful vegetables, nuts, seeds and berries.  Eat less sugar, salt, preservatives and processed foods.  Avoid toxins such as cigarettes and alcohol.  Drink water when you are thirsty. Warm water with a slice of lemon is an excellent morning drink to start the day.  Consider these websites for more information The Nutrition Source (https://www.henry-hernandez.biz/) Precision Nutrition (WindowBlog.ch)   RELAXATION Consider practicing mindfulness meditation or other relaxation techniques such as deep breathing, prayer, yoga, tai chi, massage. See website mindful.org or the apps  Headspace or Calm to help get started.   SLEEP Try to get at least 7-8+ hours sleep per day. Regular exercise and reduced caffeine will help you sleep better. Practice good sleep hygeine techniques. See website sleep.org for more information.   PLANNING Prepare estate planning, living will, healthcare POA documents. Sometimes this is best planned with the help of an attorney. Theconversationproject.org and agingwithdignity.org are excellent resources.

## 2017-02-23 NOTE — Progress Notes (Signed)
[facility]  PATIENT: Denise Spencer DOB: [birth year]  REFERRING CLINICIAN: Baldemar Lenis, M HISTORY FROM: patient  REASON FOR VISIT: new consult    HISTORICAL  CHIEF COMPLAINT:  Chief Complaint  Patient presents with  . NP  Bubuoff  . Numbness    has some balance issues/ dizziness, band of pressure around abdomen., leg weakness. Has blurry vision.     HISTORY OF PRESENT ILLNESS:   56 year old female here for evaluation of gait imbalance difficulty, numbness continuing, blurred vision.  2000 01/21/2011 patient was on Abilify for bipolar disorder. During this time patient developed tremors, weight gain, balance and gait difficulty. She was also having some numbness. Therefore Abilify was discontinued. Symptoms slightly improved but tremors, numbness, gait difficulty continued.  In 2014 patient had intermittent pain in her feet.  Patient continues to have short-term memory problems, balance difficulty, blurred vision.  Patient has had episodes of squeezing band sensation around her chest and torso.  Patient has family history of MS in her own sister and niece. Patient is concerned about similar diagnosis for herself.   REVIEW OF SYSTEMS: Full 14 system review of systems performed and negative with exception of: Weight gain fatigue chest pains trouble swallowing wheezing diarrhea incontinence joint pain aching muscles blurred vision memory loss confusion slurred speech and swallowing dizziness tremor objective sleep apnea on CPAP (in the past but not currently).  ALLERGIES: Allergies  Allergen Reactions  . Advair Diskus [Fluticasone-Salmeterol]     Caused thrush  . Amoxicillin   . Aspirin     Muscle and joint pain  . Breo Ellipta [Fluticasone Furoate-Vilanterol]     Caused thrush  . Nsaids Nausea And Vomiting  . Tape     Some adhesives cause blisters.  Tegaderm is OK.  . Ciprofloxacin Rash       . Doxycycline Rash  . Erythromycin Rash  .  Penicillins Rash  . Sulfa Antibiotics Rash         HOME MEDICATIONS: Outpatient Medications Prior to Visit  Medication Sig Dispense Refill  . albuterol (PROVENTIL HFA;VENTOLIN HFA) 108 (90 Base) MCG/ACT inhaler Inhale 2 puffs into the lungs every 6 (six) hours as needed for wheezing or shortness of breath. 1 Inhaler 2  . clonazePAM (KLONOPIN) 1 MG tablet TAKE 1 TABLET BY MOUTH THREE TIMES DAILY 270 tablet 1  . cloNIDine (CATAPRES) 0.1 MG tablet Take 0.1 mg by mouth 2 (two) times daily.    . colchicine 0.6 MG tablet Take 0.6 mg by mouth 2 (two) times daily.     . cyclobenzaprine (FLEXERIL) 10 MG tablet Take 10 mg by mouth at bedtime. AND PRN    . FLUoxetine (PROZAC) 40 MG capsule Take 2 capsules (80 mg total) by mouth daily. (Patient taking differently: Take 80 mg by mouth every morning. ) 180 capsule 1  . gabapentin (NEURONTIN) 300 MG capsule Take 300 mg by mouth 2 (two) times daily. 300 MG IN AM AND 600 MG AT BEDTIME    . nortriptyline (PAMELOR) 50 MG capsule Take 50 mg by mouth at bedtime.    . pantoprazole (PROTONIX) 20 MG tablet Take 20 mg by mouth 2 (two) times daily.     . simvastatin (ZOCOR) 10 MG tablet Take 10 mg by mouth every evening.     . Cholecalciferol (VITAMIN D-3) 5000 units TABS Take 1 tablet by mouth daily.    . Methylcobalamin (B-12) 5000 MCG TBDP Take 1 tablet by mouth daily.     No facility-administered medications  prior to visit.     PAST MEDICAL HISTORY: Past Medical History:  Diagnosis Date  . Anxiety   . Arthritis    knees, lower back  . Bipolar disorder ([place])   . Bulging lumbar disc    L5-S1  . COPD (chronic obstructive pulmonary disease) (HCC)    MILD  . Depression   . Dyspnea   . Fibromyalgia   . GERD (gastroesophageal reflux disease)   . PONV (postoperative nausea and vomiting)    with hysterectomy only  . Sleep apnea    CPAP  . Wears dentures    full upper and lower    PAST SURGICAL HISTORY: Past Surgical History:  Procedure Laterality  Date  . ABDOMINAL HYSTERECTOMY    . ABDOMINAL HYSTERECTOMY W/ PARTIAL VAGINACTOMY    . CESAREAN SECTION    . COLONOSCOPY    . COLONOSCOPY WITH PROPOFOL N/A 11/01/2016   Procedure: COLONOSCOPY WITH PROPOFOL;  Surgeon: [name], MD;  Location: [place];  Service: Endoscopy;  Laterality: N/A;  . ESOPHAGOGASTRODUODENOSCOPY (EGD) WITH PROPOFOL N/A 11/01/2016   Procedure: ESOPHAGOGASTRODUODENOSCOPY (EGD) WITH PROPOFOL;  Surgeon: [name], MD;  Location: [place];  Service: Endoscopy;  Laterality: N/A;  . POLYPECTOMY  11/01/2016   Procedure: POLYPECTOMY;  Surgeon: [name], MD;  Location: [place];  Service: Endoscopy;;  . SEPTOPLASTY Left 12/27/2016   Procedure: SEPTOPLASTY,ENDOSCOPIC TRIMMING MIDDLE TURBINATE;  Surgeon: Margaretha Sheffield, MD;  Location: [facility];  Service: ENT;  Laterality: Left;  . WRIST GANGLION EXCISION      FAMILY HISTORY: Family History  Problem Relation Age of Onset  . Breast cancer Mother 18  . Bipolar disorder Mother   . Depression Mother   . Anxiety disorder Mother   . Heart attack Father   . Thyroid cancer Father   . Hypertension Father   . Depression Sister   . Multiple sclerosis Sister   . Bipolar disorder Sister   . Hypertension Sister   . Bipolar disorder Sister   . Lymphoma Sister   . Anxiety disorder Sister   . Bipolar disorder Sister   . Multiple sclerosis Other     SOCIAL HISTORY:  Social History   Social History  . Marital status: Married    Spouse name: N/A  . Number of children: N/A  . Years of education: N/A   Occupational History  . Not on file.   Social History Main Topics  . Smoking status: Former Smoker    Packs/day: 1.00    Years: 40.00    Types: Cigarettes    Start date: 05/04/1977    Quit date: 06/25/2016  . Smokeless tobacco: Never Used     Comment:    . Alcohol use No  . Drug use: No  . Sexual activity: Yes   Other Topics Concern  . Not on file   Social History Narrative    Lives at home with husband and adult son.  Pt is disabled.  Has 2 children.  Education: 12 th grade.      PHYSICAL EXAM  GENERAL EXAM/CONSTITUTIONAL: Vitals:  Vitals:   02/23/17 1051  BP: (!) 165/100  Pulse: (!) 106  Weight: 241 lb (109.3 kg)  Height: 5\' 7"  (1.702 m)     Body mass index is 37.75 kg/m.  Visual Acuity Screening   Right eye Left eye Both eyes  Without correction: 20/50 20/50   With correction:        Patient is in no distress; well developed, nourished  and groomed; neck is supple  CARDIOVASCULAR:  Examination of carotid arteries is normal; no carotid bruits  Regular rate and rhythm, no murmurs  Examination of peripheral vascular system by observation and palpation is normal  EYES:  Ophthalmoscopic exam of optic discs and posterior segments is normal; no papilledema or hemorrhages  MUSCULOSKELETAL:  Gait, strength, tone, movements noted in Neurologic exam below  NEUROLOGIC: MENTAL STATUS:  No flowsheet data found.  awake, alert, oriented to person, place and time  recent and remote memory intact  normal attention and concentration  language fluent, comprehension intact, naming intact,   fund of knowledge appropriate  CRANIAL NERVE:   2nd - no papilledema on fundoscopic exam  2nd, 3rd, 4th, 6th - pupils equal and reactive to light, visual fields full to confrontation, extraocular muscles intact, no nystagmus  5th - facial sensation symmetric  7th - facial strength symmetric  8th - hearing intact  9th - palate elevates symmetrically, uvula midline  11th - shoulder shrug symmetric  12th - tongue protrusion midline  MOTOR:   POSTURAL TREMOR IN BUE  normal bulk and tone  full strength in the BUE  BLE --> HF 4+, KE 4  RIGHT DF 3-4, LEFT DF 5  SENSORY:   normal and symmetric to light touch  DECR VIB AND TEMP IN FEET  DECR PP IN RIGHT FOOT MORE THAN LEFT FOOT; SLIGHTLY DECR PP IN HANDS  COORDINATION:    finger-nose-finger, fine finger movements --> INTENTION TREMOR IN LUE > RUE  REFLEXES:   deep tendon reflexes BUE 1, KNEES 2, ANKLES TRACE  GAIT/STATION:   narrow based gait; able to walk on toes; CANNOT WALK ON RIGHT HEEL; UNSTEADY WITH EYES OPEN AND FEET TOGETHER    DIAGNOSTIC DATA (LABS, IMAGING, TESTING) - I reviewed patient records, labs, notes, testing and imaging myself where available.  Lab Results  Component Value Date   WBC 10.0 12/20/2016   HGB 13.5 12/20/2016   HCT 40.8 12/20/2016   MCV 84.3 12/20/2016   PLT 247 12/20/2016      Component Value Date/Time   NA 136 12/20/2016 1212   NA 140 02/22/2012 0205   K 3.6 12/20/2016 1212   K 4.0 02/22/2012 0205   CL 99 (L) 12/20/2016 1212   CL 106 02/22/2012 0205   CO2 31 12/20/2016 1212   CO2 29 02/22/2012 0205   GLUCOSE 123 (H) 12/20/2016 1212   GLUCOSE 107 (H) 02/22/2012 0205   BUN 7 12/20/2016 1212   BUN 8 02/22/2012 0205   CREATININE 0.99 12/20/2016 1212   CREATININE 0.87 02/22/2012 0205   CALCIUM 9.3 12/20/2016 1212   CALCIUM 8.7 02/22/2012 0205   GFRNONAA >60 12/20/2016 1212   GFRNONAA >60 02/22/2012 0205   GFRAA >60 12/20/2016 1212   GFRAA >60 02/22/2012 0205   No results found for: CHOL, HDL, LDLCALC, LDLDIRECT, TRIG, CHOLHDL No results found for: HGBA1C No results found for: VITAMINB12 No results found for: TSH   04/22/16 MRI lumbar spine - Left foraminal encroachment L5-S1 with impingement of left L5 nerve root due to disc and osteophyte.     ASSESSMENT AND PLAN  56 y.o. year old female here with Constellation of symptoms including tremors, gait and balance difficulty, abnormal sensation in torso, abnormal sensation in feet, blurred vision, with abnormal neurologic exam. Proceed with additional workup to rule out demyelinating disease or other neurologic causes.   Ddx: CNS demyelinating disease, inflamm, autoimmune, metabolic, nutritional deficiency, obesity, medication side effect  1.  Numbness  2. Gait difficulty   3. Muscle spasm      PLAN: - check MRI brain, cervical and thoracic spine --> evaluate for demyelinating disease - continue B12 replacement - continue OSA treatment  Orders Placed This Encounter  Procedures  . MR BRAIN W WO CONTRAST  . MR CERVICAL SPINE W WO CONTRAST  . MR THORACIC SPINE W WO CONTRAST   Return in about 2 months (around 04/25/2017).    Penni Bombard, MD 11/27/3727, 02:11 AM Certified in Neurology, Neurophysiology and Neuroimaging  [facility] [address], [place],  [address] [phone]

## 2017-03-04 ENCOUNTER — Ambulatory Visit: Payer: PPO | Admitting: Pulmonary Disease

## 2017-03-08 ENCOUNTER — Ambulatory Visit (INDEPENDENT_AMBULATORY_CARE_PROVIDER_SITE_OTHER): Payer: PPO | Admitting: Psychiatry

## 2017-03-08 ENCOUNTER — Encounter: Payer: Self-pay | Admitting: Psychiatry

## 2017-03-08 VITALS — BP 154/97 | HR 105 | Temp 98.6°F | Wt 239.2 lb

## 2017-03-08 DIAGNOSIS — G4733 Obstructive sleep apnea (adult) (pediatric): Secondary | ICD-10-CM | POA: Diagnosis not present

## 2017-03-08 DIAGNOSIS — F332 Major depressive disorder, recurrent severe without psychotic features: Secondary | ICD-10-CM | POA: Diagnosis not present

## 2017-03-08 MED ORDER — FLUOXETINE HCL 40 MG PO CAPS: 80.0000 mg | ORAL_CAPSULE | ORAL | 1 refills | Status: DC

## 2017-03-08 MED ORDER — CLONAZEPAM 1 MG PO TABS: 1.0000 mg | ORAL_TABLET | Freq: Three times a day (TID) | ORAL | 1 refills | Status: DC

## 2017-03-08 NOTE — Progress Notes (Signed)
Follow-up for 56 year old woman with chronic depression. He continues to suffer multiple health problems a lot of them related to her pain in her knees. She says she is also getting a workup of multiple sclerosis. She is concerned about neuropathy and tremors. Mood however is good. No major depression. Stresses are under better control. Tolerating medicine well.  Neatly dressed and groomed. Good eye contact. Normal psychomotor activity. No suicidal ideation no psychosis.  Supportive counseling and review medicine. No sign of misuse or abuse of medicine. Renewed her medications including the Klonopin which she was given a paper prescription for as well as the Prozac. Follow-up 6 months

## 2017-03-09 ENCOUNTER — Ambulatory Visit
Admission: RE | Admit: 2017-03-09 | Discharge: 2017-03-09 | Disposition: A | Discharge status: Home / Self Care | Payer: PPO | Source: Ambulatory Visit | Attending: Diagnostic Neuroimaging | Admitting: Diagnostic Neuroimaging

## 2017-03-09 DIAGNOSIS — M62838 Other muscle spasm: Secondary | ICD-10-CM

## 2017-03-09 DIAGNOSIS — M50222 Other cervical disc displacement at C5-C6 level: Secondary | ICD-10-CM | POA: Diagnosis not present

## 2017-03-09 DIAGNOSIS — R269 Unspecified abnormalities of gait and mobility: Secondary | ICD-10-CM

## 2017-03-09 DIAGNOSIS — R200 Anesthesia of skin: Secondary | ICD-10-CM

## 2017-03-09 DIAGNOSIS — R251 Tremor, unspecified: Secondary | ICD-10-CM | POA: Diagnosis not present

## 2017-03-09 DIAGNOSIS — R413 Other amnesia: Secondary | ICD-10-CM | POA: Diagnosis not present

## 2017-03-09 MED ORDER — GADOBENATE DIMEGLUMINE 529 MG/ML IV SOLN
20.0000 mL | Freq: Once | INTRAVENOUS | Status: AC | PRN
  Administered 2017-03-09: 20 mL via INTRAVENOUS

## 2017-03-14 ENCOUNTER — Other Ambulatory Visit: Payer: Self-pay | Admitting: Psychiatry

## 2017-03-15 ENCOUNTER — Telehealth: Payer: Self-pay | Admitting: Diagnostic Neuroimaging

## 2017-03-15 NOTE — Telephone Encounter (Signed)
Can your result note on her 3 MRI's please.

## 2017-03-15 NOTE — Telephone Encounter (Signed)
Patient requesting MRI results

## 2017-03-16 ENCOUNTER — Telehealth: Payer: Self-pay | Admitting: *Deleted

## 2017-03-16 NOTE — Telephone Encounter (Signed)
Spoke to pt and relayed the results of all three of her MRI brain, cervical and thoracic.  She continues with same sx.  She is asking if anything else to be done?  (other then b12 supplement, and osa treatment).  Please advise.

## 2017-03-16 NOTE — Telephone Encounter (Signed)
Optimize nutrition, fitness, psychiatry treatments. Follow up with PCP. -VRP

## 2017-03-16 NOTE — Telephone Encounter (Signed)
-----   Message from Penni Bombard, MD sent at 03/15/2017  4:59 PM EDT ----- Unremarkable imaging results. Please call patient. Continue current plan. -VRP

## 2017-03-17 NOTE — Telephone Encounter (Signed)
LMVM for pt that note per Dr. Leta Baptist below.  Pt to call back if questions.

## 2017-03-17 NOTE — Telephone Encounter (Signed)
Patient called office and has been advised of Dr. Gladstone Lighter note below.  Patient voiced understanding. FYI

## 2017-03-23 DIAGNOSIS — M1712 Unilateral primary osteoarthritis, left knee: Secondary | ICD-10-CM | POA: Diagnosis not present

## 2017-03-23 DIAGNOSIS — M542 Cervicalgia: Secondary | ICD-10-CM | POA: Diagnosis not present

## 2017-03-28 ENCOUNTER — Ambulatory Visit (INDEPENDENT_AMBULATORY_CARE_PROVIDER_SITE_OTHER): Payer: PPO | Admitting: Pulmonary Disease

## 2017-03-28 ENCOUNTER — Encounter: Payer: Self-pay | Admitting: Pulmonary Disease

## 2017-03-28 VITALS — BP 144/102 | HR 104 | Ht 67.0 in | Wt 235.0 lb

## 2017-03-28 DIAGNOSIS — G4733 Obstructive sleep apnea (adult) (pediatric): Secondary | ICD-10-CM

## 2017-03-28 DIAGNOSIS — E668 Other obesity: Secondary | ICD-10-CM | POA: Diagnosis not present

## 2017-03-28 NOTE — Patient Instructions (Signed)
Referral to mask fit clinic  Continue CPAP as prescribed  We discussed the importance of exercise and weight loss  I reemphasized the importance of complete abstinence from cigarette smoking and exposure to secondhand smoke  Follow-up in 4-6 months

## 2017-03-28 NOTE — Progress Notes (Signed)
PULMONARY OFFICE FOLLOW-UP NOTE  Requesting MD/Service: Denise Spencer Date of initial consultation: 08/16/16 Reason for consultation: cough, dyspnea  PT PROFILE: 56 y.o. former smoker with very mild COPD and moderate OSA  DATA/EVENTS: PSG 09/06/16: Moderate OSA, AHI 27/hr. 320 desaturation events. Lowest SpO2 77% PFTs 09/16/16: FVC:  3.46 L ( 100 %pred), FEV1:  2.67 L ( 95 %pred), FEV1/FVC: 77% , TLC:  5.60 L ( 101 %pred), DLCO  66 %pred, DLCO/VA normal.  Nasal septum surgery 12/27/16   SUBJ: This is a routine reevaluation. She has had significant difficulty tolerating the CPAP mask since her nasal septum surgery. She complains of a smothering sensation and is often removing the mask during sleep. She continues to report daytime sleepiness. He denies any further cigarette smoking but continues to have exposure to secondhand smoke. She remains sedentary and has made little effort at weight loss. Denies CP, fever, purulent sputum, hemoptysis, LE edema and calf tenderness    Vitals:   03/28/17 1037 03/28/17 1042  BP:  (!) 144/102  Pulse:  (!) 104  SpO2:  95%  Weight: 106.6 kg (235 lb)   Height: 5\' 7"  (1.702 m)      EXAM:  Gen: Obese, NAD HEENT: NCAT, sclera white, oropharynx normal Neck: Supple without LAN, thyromegaly, JVD Lungs: breath sounds full, No wheezes Cardiovascular: RRR, no murmurs noted Abdomen: Obese, soft, nontender, normal BS Ext: without clubbing, cyanosis, edema Neuro: grossly intact  DATA:   BMP Latest Ref Rng & Units 12/20/2016 07/01/2016 11/30/2015  Glucose 65 - 99 mg/dL 123(H) 152(H) 125(H)  BUN 6 - 20 mg/dL 7 6 8   Creatinine 0.44 - 1.00 mg/dL 0.99 0.98 1.06(H)  Sodium 135 - 145 mmol/L 136 136 136  Potassium 3.5 - 5.1 mmol/L 3.6 3.5 3.7  Chloride 101 - 111 mmol/L 99(L) 97(L) 100(L)  CO2 22 - 32 mmol/L 31 29 28   Calcium 8.9 - 10.3 mg/dL 9.3 9.2 8.9    CBC Latest Ref Rng & Units 12/20/2016 07/01/2016 11/30/2015  WBC 3.6 - 11.0 K/uL 10.0 14.3(H) 16.5(H)   Hemoglobin 12.0 - 16.0 g/dL 13.5 15.2 15.4  Hematocrit 35.0 - 47.0 % 40.8 45.1 48.4(H)  Platelets 150 - 440 K/uL 247 443(H) 262    CXR:  NNF  IMPRESSION:   1) Moderate OSA 2) former smoker 3) very mild COPD 4) Exertional dyspnea out of proportion to PFT findings. Component of deconditioning and/or obesity  PLAN:  Referral to mask fit clinic  Continue CPAP as prescribed  We discussed the importance of exercise and weight loss  I re-emphasized the importance of complete abstinence from cigarette smoking and exposure to secondhand smoke  Follow-up in 4-6 months  Merton Border, MD PCCM service Mobile [phone] Pager [phone] 03/28/2017

## 2017-04-06 ENCOUNTER — Other Ambulatory Visit (HOSPITAL_BASED_OUTPATIENT_CLINIC_OR_DEPARTMENT_OTHER): Payer: PPO

## 2017-04-07 DIAGNOSIS — G4733 Obstructive sleep apnea (adult) (pediatric): Secondary | ICD-10-CM | POA: Diagnosis not present

## 2017-04-11 ENCOUNTER — Telehealth: Payer: Self-pay | Admitting: Pulmonary Disease

## 2017-04-11 MED ORDER — ALBUTEROL SULFATE HFA 108 (90 BASE) MCG/ACT IN AERS: 2.0000 | INHALATION_SPRAY | Freq: Four times a day (QID) | RESPIRATORY_TRACT | 0 refills | Status: DC | PRN

## 2017-04-11 NOTE — Telephone Encounter (Signed)
°*  STAT* If patient is at the pharmacy, call can be transferred to refill team.   1. Which medications need to be refilled? (please list name of each medication and dose if known)   Albuterol 108 mcg/act inh 2 puff q 6 prn   2. Which pharmacy/location (including street and city if local pharmacy) is medication to be sent to?  [place]   3. Do they need a 30 day or 90 day supply? [place]

## 2017-04-11 NOTE — Telephone Encounter (Signed)
Done. ss

## 2017-04-12 ENCOUNTER — Other Ambulatory Visit: Payer: Self-pay | Admitting: *Deleted

## 2017-04-12 MED ORDER — ALBUTEROL SULFATE HFA 108 (90 BASE) MCG/ACT IN AERS: 2.0000 | INHALATION_SPRAY | Freq: Four times a day (QID) | RESPIRATORY_TRACT | 2 refills | Status: DC | PRN

## 2017-04-18 DIAGNOSIS — M5416 Radiculopathy, lumbar region: Secondary | ICD-10-CM | POA: Diagnosis not present

## 2017-04-21 ENCOUNTER — Other Ambulatory Visit: Payer: Self-pay | Admitting: *Deleted

## 2017-04-21 MED ORDER — ALBUTEROL SULFATE HFA 108 (90 BASE) MCG/ACT IN AERS
2.0000 | INHALATION_SPRAY | Freq: Four times a day (QID) | RESPIRATORY_TRACT | 2 refills | Status: DC | PRN
Start: 2017-04-21 — End: 2018-06-16

## 2017-04-21 NOTE — Progress Notes (Signed)
rx sent per patient request

## 2017-04-21 NOTE — Telephone Encounter (Signed)
[facility] calling says [facility] cannot fill albuterol as insurance has denied  She is almost out of inh use on current inhaler and is sick so she will need something   Please call

## 2017-04-21 NOTE — Telephone Encounter (Signed)
[facility]er was sent to [facility] on 04/[facility]/17. Patient wanted sent to [facility]. Rx resubmitted via telephone.

## 2017-04-26 DIAGNOSIS — Z9989 Dependence on other enabling machines and devices: Secondary | ICD-10-CM | POA: Diagnosis not present

## 2017-04-26 DIAGNOSIS — G4733 Obstructive sleep apnea (adult) (pediatric): Secondary | ICD-10-CM | POA: Diagnosis not present

## 2017-04-26 DIAGNOSIS — Z7722 Contact with and (suspected) exposure to environmental tobacco smoke (acute) (chronic): Secondary | ICD-10-CM | POA: Diagnosis not present

## 2017-04-26 DIAGNOSIS — R030 Elevated blood-pressure reading, without diagnosis of hypertension: Secondary | ICD-10-CM | POA: Diagnosis not present

## 2017-04-26 DIAGNOSIS — J069 Acute upper respiratory infection, unspecified: Secondary | ICD-10-CM | POA: Diagnosis not present

## 2017-04-27 ENCOUNTER — Encounter: Payer: Self-pay | Admitting: Diagnostic Neuroimaging

## 2017-04-27 ENCOUNTER — Ambulatory Visit: Payer: PPO | Admitting: Diagnostic Neuroimaging

## 2017-04-27 VITALS — BP 136/96 | HR 100 | Ht 67.0 in | Wt 238.2 lb

## 2017-04-27 DIAGNOSIS — R269 Unspecified abnormalities of gait and mobility: Secondary | ICD-10-CM | POA: Diagnosis not present

## 2017-04-27 DIAGNOSIS — R251 Tremor, unspecified: Secondary | ICD-10-CM | POA: Diagnosis not present

## 2017-04-27 NOTE — Progress Notes (Signed)
[facility]  PATIENT: Denise Spencer DOB: June 26, 1960  REFERRING CLINICIAN: Baldemar Lenis, M HISTORY FROM: patient  REASON FOR VISIT: follow up    HISTORICAL  CHIEF COMPLAINT:  Chief Complaint  Patient presents with  . Follow-up  . numbness/ gait    doing same, go over MRI results.     HISTORY OF PRESENT ILLNESS:   UPDATE (04/27/17, VRP): Since last visit, doing about the same. MRI scans reviewed. Balance still off. Almost fell recently. Hands still have numbness. Trouble with fine motor movements in hands. More clumsy. Dropping things. When patient is pressured or rushed, she feels more off balance.   PRIOR HPI (02/23/17): 56 year old female here for evaluation of gait imbalance difficulty, numbness continuing, blurred vision. 2008 - 2012 patient was on Abilify for bipolar disorder. During this time patient developed tremors, weight gain, balance and gait difficulty. She was also having some numbness. Therefore Abilify was discontinued. Symptoms slightly improved but tremors, numbness, gait difficulty continued. In 2014 patient had intermittent pain in her feet. Patient continues to have short-term memory problems, balance difficulty, blurred vision. Patient has had episodes of squeezing band sensation around her chest and torso. Patient has family history of MS in her own sister and niece. Patient is concerned about similar diagnosis for herself.   REVIEW OF SYSTEMS: Full 14 system review of systems performed and negative with exception of: memory loss numbness depression back pain fatigue.   ALLERGIES: Allergies  Allergen Reactions  . Advair Diskus [Fluticasone-Salmeterol]     Caused thrush  . Amoxicillin   . Aspirin     Muscle and joint pain  . Breo Ellipta [Fluticasone Furoate-Vilanterol]     Caused thrush  . Nsaids Nausea And Vomiting  . Tape     Some adhesives cause blisters.  Tegaderm is OK.  . Ciprofloxacin Rash     Has taken now with no problem.   04-27-17  . Doxycycline Rash  . Erythromycin Rash  . Penicillins Rash  . Sulfa Antibiotics Rash         HOME MEDICATIONS: Outpatient Medications Prior to Visit  Medication Sig Dispense Refill  . albuterol (PROAIR HFA) 108 (90 Base) MCG/ACT inhaler Inhale 2 puffs into the lungs every 6 (six) hours as needed for wheezing or shortness of breath. 1 Inhaler 2  . Cholecalciferol (VITAMIN D-3) 5000 units TABS Take 1 tablet by mouth daily.    . clonazePAM (KLONOPIN) 1 MG tablet Take 1 tablet (1 mg total) by mouth 3 (three) times daily. 270 tablet 1  . cloNIDine (CATAPRES) 0.1 MG tablet Take 0.1 mg by mouth 2 (two) times daily.    . colchicine 0.6 MG tablet Take 0.6 mg by mouth 2 (two) times daily.     . cyclobenzaprine (FLEXERIL) 10 MG tablet Take 10 mg by mouth at bedtime. AND PRN    . FLUoxetine (PROZAC) 40 MG capsule Take 2 capsules (80 mg total) by mouth every morning. 180 capsule 1  . gabapentin (NEURONTIN) 300 MG capsule Take 300 mg by mouth 2 (two) times daily. 300 MG IN AM AND 600 MG AT BEDTIME    . Methylcobalamin (B-12) 5000 MCG TBDP Take 1 tablet by mouth daily.    . nortriptyline (PAMELOR) 50 MG capsule Take 50 mg by mouth at bedtime.    . pantoprazole (PROTONIX) 20 MG tablet Take 20 mg by mouth 2 (two) times daily.     . simvastatin (ZOCOR) 10 MG tablet Take 10 mg by mouth every evening.  No facility-administered medications prior to visit.     PAST MEDICAL HISTORY: Past Medical History:  Diagnosis Date  . Anxiety   . Arthritis    knees, lower back  . Bipolar disorder ([place])   . Bulging lumbar disc    L5-S1  . COPD (chronic obstructive pulmonary disease) (HCC)    MILD  . Depression   . Dyspnea   . Fibromyalgia   . GERD (gastroesophageal reflux disease)   . PONV (postoperative nausea and vomiting)    with hysterectomy only  . Sleep apnea    CPAP  . Wears dentures    full upper and lower    PAST SURGICAL HISTORY: Past Surgical History:  Procedure Laterality  Date  . ABDOMINAL HYSTERECTOMY    . ABDOMINAL HYSTERECTOMY W/ PARTIAL VAGINACTOMY    . CESAREAN SECTION    . COLONOSCOPY    . WRIST GANGLION EXCISION      FAMILY HISTORY: Family History  Problem Relation Age of Onset  . Breast cancer Mother 25  . Bipolar disorder Mother   . Depression Mother   . Anxiety disorder Mother   . Heart attack Father   . Thyroid cancer Father   . Hypertension Father   . Depression Sister   . Multiple sclerosis Sister   . Bipolar disorder Sister   . Hypertension Sister   . Bipolar disorder Sister   . Lymphoma Sister   . Anxiety disorder Sister   . Bipolar disorder Sister   . Multiple sclerosis Other     SOCIAL HISTORY:  Social History   Socioeconomic History  . Marital status: Married    Spouse name: Not on file  . Number of children: Not on file  . Years of education: Not on file  . Highest education level: Not on file  Social Needs  . Financial resource strain: Not on file  . Food insecurity - worry: Not on file  . Food insecurity - inability: Not on file  . Transportation needs - medical: Not on file  . Transportation needs - non-medical: Not on file  Occupational History  . Not on file  Tobacco Use  . Smoking status: Former Smoker    Packs/day: 1.00    Years: 40.00    Pack years: 40.00    Types: Cigarettes    Start date: 05/04/1977    Last attempt to quit: 06/25/2016    Years since quitting: 0.8  . Smokeless tobacco: Never Used  . Tobacco comment:    Substance and Sexual Activity  . Alcohol use: No    Alcohol/week: 0.0 oz  . Drug use: No  . Sexual activity: Yes  Other Topics Concern  . Not on file  Social History Narrative   Lives at home with husband and adult son.  Pt is disabled.  Has 2 children.  Education: 12 th grade.      PHYSICAL EXAM  GENERAL EXAM/CONSTITUTIONAL: Vitals:  Vitals:   04/27/17 1018  BP: (!) 136/96  Pulse: 100  Weight: 238 lb 3.2 oz (108 kg)  Height: 5\' 7"  (1.702 m)   Body mass index is  37.31 kg/m. No exam data present  Patient is in no distress; well developed, nourished and groomed; neck is supple  CARDIOVASCULAR:  Examination of carotid arteries is normal; no carotid bruits  Regular rate and rhythm, no murmurs  Examination of peripheral vascular system by observation and palpation is normal  EYES:  Ophthalmoscopic exam of optic discs and posterior segments is normal; no  papilledema or hemorrhages  MUSCULOSKELETAL:  Gait, strength, tone, movements noted in Neurologic exam below  NEUROLOGIC: MENTAL STATUS:  No flowsheet data found.  awake, alert, oriented to person, place and time  recent and remote memory intact  normal attention and concentration  language fluent, comprehension intact, naming intact,   fund of knowledge appropriate  CRANIAL NERVE:   2nd - no papilledema on fundoscopic exam  2nd, 3rd, 4th, 6th - pupils equal and reactive to light, visual fields full to confrontation, extraocular muscles intact, no nystagmus  5th - facial sensation symmetric  7th - facial strength symmetric  8th - hearing intact  9th - palate elevates symmetrically, uvula midline  11th - shoulder shrug symmetric  12th - tongue protrusion midline  MOTOR:   POSTURAL TREMOR IN BUE  normal bulk and tone  full strength in the BUE  BLE --> HF 4+, KE 4  RIGHT DF 3-4, LEFT DF 5  SENSORY:   normal and symmetric to light touch  DECR VIB AND TEMP IN FEET  COORDINATION:   finger-nose-finger, fine finger movements --> INTENTION TREMOR IN LUE > RUE  REFLEXES:   deep tendon reflexes BUE 1, KNEES 2, ANKLES TRACE  GAIT/STATION:   narrow based gait; able to walk on toes; CANNOT WALK ON RIGHT HEEL; UNSTEADY WITH EYES OPEN AND FEET TOGETHER    DIAGNOSTIC DATA (LABS, IMAGING, TESTING) - I reviewed patient records, labs, notes, testing and imaging myself where available.  Lab Results  Component Value Date   WBC 10.0 12/20/2016   HGB 13.5  12/20/2016   HCT 40.8 12/20/2016   MCV 84.3 12/20/2016   PLT 247 12/20/2016      Component Value Date/Time   NA 136 12/20/2016 1212   NA 140 02/22/2012 0205   K 3.6 12/20/2016 1212   K 4.0 02/22/2012 0205   CL 99 (L) 12/20/2016 1212   CL 106 02/22/2012 0205   CO2 31 12/20/2016 1212   CO2 29 02/22/2012 0205   GLUCOSE 123 (H) 12/20/2016 1212   GLUCOSE 107 (H) 02/22/2012 0205   BUN 7 12/20/2016 1212   BUN 8 02/22/2012 0205   CREATININE 0.99 12/20/2016 1212   CREATININE 0.87 02/22/2012 0205   CALCIUM 9.3 12/20/2016 1212   CALCIUM 8.7 02/22/2012 0205   GFRNONAA >60 12/20/2016 1212   GFRNONAA >60 02/22/2012 0205   GFRAA >60 12/20/2016 1212   GFRAA >60 02/22/2012 0205   No results found for: CHOL, HDL, LDLCALC, LDLDIRECT, TRIG, CHOLHDL No results found for: HGBA1C No results found for: VITAMINB12 No results found for: TSH   04/22/16 MRI lumbar spine - Left foraminal encroachment L5-S1 with impingement of left L5 nerve root due to disc and osteophyte.  03/09/17 MRI brain (with and without) [I reviewed images myself and agree with interpretation. -VRP]  1. Mild periventricular and subcortical chronic small vessel ischemic disease.  2. No abnormal lesions are seen on post contrast views.   03/09/17 MRI cervical spine (with and without) [I reviewed images myself and agree with interpretation. -VRP]  1. At C5-6: disc bulging and uncovertebral joint hypertrophy with moderate left foraminal stenosis  2. No intrinsic, compressive or abnormal enhancing spinal cord lesions.  03/09/17 MRI thoracic spine (with and without) [I reviewed images myself and agree with interpretation. -VRP]  - Unremarkable       ASSESSMENT AND PLAN  56 y.o. year old female here with Constellation of symptoms including tremors, gait and balance difficulty, abnormal sensation in torso, abnormal sensation in  feet, blurred vision, with abnormal neurologic exam. MRI brain, cervical, thoracic and lumbar spine  overall unremarkable.    Ddx: stress reaction, obesity, deconditioning, medication side effect, pre-diabetic neuropathy  1. Gait difficulty   2. Tremor      PLAN:  - refer to PT and OT for evaluation - continue B12 replacement - continue OSA treatment - monitor sugars / A1c per PCP  Orders Placed This Encounter  Procedures  . Ambulatory referral to Physical Therapy  . Ambulatory referral to Occupational Therapy   Return if symptoms worsen or fail to improve, for return to PCP.     Penni Bombard, MD 54/09/9199, 00:71 AM Certified in Neurology, Neurophysiology and Neuroimaging  [facility] [address], [place], [place] [address] [phone]

## 2017-04-27 NOTE — Patient Instructions (Signed)
Thank you for coming to see [name] at [facility]. I hope we have been able to provide you high quality care today.  You may receive a patient satisfaction survey over the next few weeks. We would appreciate your feedback and comments so that we may continue to improve ourselves and the health of our patients.   - start physical and occupational therapy exercises  - start exercise program --> 2-3 days per week; try swimming     ~~~~~~~~~~~~~~~~~~~~~~~~~~~~~~~~~~~~~~~~~~~~~~~~~~~~~~~~~~~~~~~~~  DR. PENUMALLI'S GUIDE TO HAPPY AND HEALTHY LIVING These are some of my general health and wellness recommendations. Some of them may apply to you better than others. Please use common sense as you try these suggestions and feel free to ask me any questions.   ACTIVITY/FITNESS Mental, social, emotional and physical stimulation are very important for brain and body health. Try learning a new activity (arts, music, language, sports, games).  Keep moving your body to the best of your abilities. You can do this at home, inside or outside, the park, community center, gym or anywhere you like. Consider a physical therapist or personal trainer to get started. Consider the app Sworkit. Fitness trackers such as smart-watches, smart-phones or Fitbits can help as well.   NUTRITION Eat more plants: colorful vegetables, nuts, seeds and berries.  Eat less sugar, salt, preservatives and processed foods.  Avoid toxins such as cigarettes and alcohol.  Drink water when you are thirsty. Warm water with a slice of lemon is an excellent morning drink to start the day.  Consider these websites for more information The Nutrition Source (https://www.henry-hernandez.biz/) Precision Nutrition (WindowBlog.ch)   RELAXATION Consider practicing mindfulness meditation or other relaxation techniques such as deep breathing, prayer, yoga, tai chi, massage. See  website mindful.org or the apps Headspace or Calm to help get started.   SLEEP Try to get at least 7-8+ hours sleep per day. Regular exercise and reduced caffeine will help you sleep better. Practice good sleep hygeine techniques. See website sleep.org for more information.   PLANNING Prepare estate planning, living will, healthcare POA documents. Sometimes this is best planned with the help of an attorney. Theconversationproject.org and agingwithdignity.org are excellent resources.

## 2017-05-02 ENCOUNTER — Telehealth: Payer: Self-pay | Admitting: Pulmonary Disease

## 2017-05-02 DIAGNOSIS — G4733 Obstructive sleep apnea (adult) (pediatric): Secondary | ICD-10-CM

## 2017-05-02 NOTE — Telephone Encounter (Signed)
Pt states she needs an appt to have her mask fitted. Please call.

## 2017-05-02 NOTE — Telephone Encounter (Signed)
Order placed for mask fitting per DS' last note. Nothing further needed.

## 2017-05-08 DIAGNOSIS — G4733 Obstructive sleep apnea (adult) (pediatric): Secondary | ICD-10-CM | POA: Diagnosis not present

## 2017-05-09 DIAGNOSIS — J4531 Mild persistent asthma with (acute) exacerbation: Secondary | ICD-10-CM | POA: Diagnosis not present

## 2017-05-10 DIAGNOSIS — Z79899 Other long term (current) drug therapy: Secondary | ICD-10-CM | POA: Diagnosis not present

## 2017-05-10 DIAGNOSIS — R739 Hyperglycemia, unspecified: Secondary | ICD-10-CM | POA: Diagnosis not present

## 2017-05-17 DIAGNOSIS — Z9989 Dependence on other enabling machines and devices: Secondary | ICD-10-CM | POA: Diagnosis not present

## 2017-05-17 DIAGNOSIS — Z79899 Other long term (current) drug therapy: Secondary | ICD-10-CM | POA: Diagnosis not present

## 2017-05-17 DIAGNOSIS — R748 Abnormal levels of other serum enzymes: Secondary | ICD-10-CM | POA: Diagnosis not present

## 2017-05-17 DIAGNOSIS — R739 Hyperglycemia, unspecified: Secondary | ICD-10-CM | POA: Diagnosis not present

## 2017-05-17 DIAGNOSIS — F419 Anxiety disorder, unspecified: Secondary | ICD-10-CM | POA: Diagnosis not present

## 2017-05-17 DIAGNOSIS — G4733 Obstructive sleep apnea (adult) (pediatric): Secondary | ICD-10-CM | POA: Diagnosis not present

## 2017-05-17 DIAGNOSIS — E7800 Pure hypercholesterolemia, unspecified: Secondary | ICD-10-CM | POA: Diagnosis not present

## 2017-05-25 DIAGNOSIS — M1712 Unilateral primary osteoarthritis, left knee: Secondary | ICD-10-CM | POA: Diagnosis not present

## 2017-05-30 ENCOUNTER — Other Ambulatory Visit (HOSPITAL_BASED_OUTPATIENT_CLINIC_OR_DEPARTMENT_OTHER): Payer: PPO

## 2017-06-01 DIAGNOSIS — J029 Acute pharyngitis, unspecified: Secondary | ICD-10-CM | POA: Diagnosis not present

## 2017-06-06 ENCOUNTER — Ambulatory Visit (HOSPITAL_BASED_OUTPATIENT_CLINIC_OR_DEPARTMENT_OTHER): Payer: PPO | Attending: Pulmonary Disease | Admitting: Radiology

## 2017-06-06 DIAGNOSIS — G4733 Obstructive sleep apnea (adult) (pediatric): Secondary | ICD-10-CM

## 2017-06-07 DIAGNOSIS — G4733 Obstructive sleep apnea (adult) (pediatric): Secondary | ICD-10-CM | POA: Diagnosis not present

## 2017-06-29 DIAGNOSIS — M1712 Unilateral primary osteoarthritis, left knee: Secondary | ICD-10-CM | POA: Diagnosis not present

## 2017-07-08 DIAGNOSIS — G4733 Obstructive sleep apnea (adult) (pediatric): Secondary | ICD-10-CM | POA: Diagnosis not present

## 2017-07-29 DIAGNOSIS — G4733 Obstructive sleep apnea (adult) (pediatric): Secondary | ICD-10-CM | POA: Diagnosis not present

## 2017-08-08 DIAGNOSIS — G4733 Obstructive sleep apnea (adult) (pediatric): Secondary | ICD-10-CM | POA: Diagnosis not present

## 2017-08-19 DIAGNOSIS — M25552 Pain in left hip: Secondary | ICD-10-CM | POA: Diagnosis not present

## 2017-08-19 DIAGNOSIS — M25551 Pain in right hip: Secondary | ICD-10-CM | POA: Diagnosis not present

## 2017-08-19 DIAGNOSIS — M5416 Radiculopathy, lumbar region: Secondary | ICD-10-CM | POA: Diagnosis not present

## 2017-08-19 DIAGNOSIS — R5383 Other fatigue: Secondary | ICD-10-CM | POA: Diagnosis not present

## 2017-08-19 DIAGNOSIS — M545 Low back pain: Secondary | ICD-10-CM | POA: Diagnosis not present

## 2017-08-19 DIAGNOSIS — M791 Myalgia, unspecified site: Secondary | ICD-10-CM | POA: Diagnosis not present

## 2017-08-19 DIAGNOSIS — E559 Vitamin D deficiency, unspecified: Secondary | ICD-10-CM | POA: Diagnosis not present

## 2017-08-30 ENCOUNTER — Other Ambulatory Visit: Payer: Self-pay

## 2017-08-30 ENCOUNTER — Ambulatory Visit: Payer: PPO | Admitting: Psychiatry

## 2017-08-30 ENCOUNTER — Encounter: Payer: Self-pay | Admitting: Psychiatry

## 2017-08-30 VITALS — BP 138/78 | HR 100 | Temp 98.5°F | Wt 239.8 lb

## 2017-08-30 DIAGNOSIS — F332 Major depressive disorder, recurrent severe without psychotic features: Secondary | ICD-10-CM | POA: Diagnosis not present

## 2017-08-30 DIAGNOSIS — M25579 Pain in unspecified ankle and joints of unspecified foot: Secondary | ICD-10-CM | POA: Insufficient documentation

## 2017-08-30 DIAGNOSIS — M5136 Other intervertebral disc degeneration, lumbar region: Secondary | ICD-10-CM | POA: Insufficient documentation

## 2017-08-30 DIAGNOSIS — M7670 Peroneal tendinitis, unspecified leg: Secondary | ICD-10-CM | POA: Insufficient documentation

## 2017-08-30 DIAGNOSIS — M25569 Pain in unspecified knee: Secondary | ICD-10-CM | POA: Insufficient documentation

## 2017-08-30 DIAGNOSIS — M2240 Chondromalacia patellae, unspecified knee: Secondary | ICD-10-CM | POA: Insufficient documentation

## 2017-08-30 MED ORDER — CLONAZEPAM 1 MG PO TABS
1.0000 mg | ORAL_TABLET | Freq: Three times a day (TID) | ORAL | 1 refills | Status: DC
Start: 2017-08-30 — End: 2018-03-28

## 2017-08-30 MED ORDER — FLUOXETINE HCL 40 MG PO CAPS: 40.0000 mg | ORAL_CAPSULE | ORAL | 1 refills | Status: DC

## 2017-08-30 NOTE — Progress Notes (Signed)
Follow-up patient with a history of depression.  Patient continues to have some significant pain problems.  Mood is generally stable but she has noticed more of a tendency towards losing her temper easily.  Not constantly angry but irritable intermittently.  She sleeps well.  Has no other side effects or complaints.  Neatly dressed and groomed.  Mood is stable affect stable thoughts lucid no suicidal or homicidal ideation no sign of psychosis.  Reviewing medicine it looks like perhaps the high dose of Prozac may be a little too stimulating at this point.  Suggest she try cutting it down to 40 mg a day.  Continue clonazepam.  Patient agreeable to the plan.  She knows she can contact me sooner if needed.  Otherwise follow-up 6 months.

## 2017-09-05 DIAGNOSIS — G4733 Obstructive sleep apnea (adult) (pediatric): Secondary | ICD-10-CM | POA: Diagnosis not present

## 2017-09-09 DIAGNOSIS — E7800 Pure hypercholesterolemia, unspecified: Secondary | ICD-10-CM | POA: Diagnosis not present

## 2017-09-09 DIAGNOSIS — R739 Hyperglycemia, unspecified: Secondary | ICD-10-CM | POA: Diagnosis not present

## 2017-09-09 DIAGNOSIS — Z79899 Other long term (current) drug therapy: Secondary | ICD-10-CM | POA: Diagnosis not present

## 2017-09-15 DIAGNOSIS — Z0000 Encounter for general adult medical examination without abnormal findings: Secondary | ICD-10-CM | POA: Diagnosis not present

## 2017-09-15 DIAGNOSIS — M545 Low back pain: Secondary | ICD-10-CM | POA: Diagnosis not present

## 2017-09-15 DIAGNOSIS — G8929 Other chronic pain: Secondary | ICD-10-CM | POA: Diagnosis not present

## 2017-10-03 DIAGNOSIS — M545 Low back pain: Secondary | ICD-10-CM | POA: Diagnosis not present

## 2017-10-03 DIAGNOSIS — M5416 Radiculopathy, lumbar region: Secondary | ICD-10-CM | POA: Diagnosis not present

## 2017-10-06 DIAGNOSIS — G4733 Obstructive sleep apnea (adult) (pediatric): Secondary | ICD-10-CM | POA: Diagnosis not present

## 2017-10-28 DIAGNOSIS — M5416 Radiculopathy, lumbar region: Secondary | ICD-10-CM | POA: Diagnosis not present

## 2017-10-28 DIAGNOSIS — M545 Low back pain: Secondary | ICD-10-CM | POA: Diagnosis not present

## 2017-11-05 DIAGNOSIS — G4733 Obstructive sleep apnea (adult) (pediatric): Secondary | ICD-10-CM | POA: Diagnosis not present

## 2017-11-07 DIAGNOSIS — M7711 Lateral epicondylitis, right elbow: Secondary | ICD-10-CM | POA: Diagnosis not present

## 2017-11-23 ENCOUNTER — Other Ambulatory Visit: Payer: Self-pay | Admitting: Family Medicine

## 2017-11-23 DIAGNOSIS — Z1231 Encounter for screening mammogram for malignant neoplasm of breast: Secondary | ICD-10-CM

## 2017-11-29 DIAGNOSIS — M5416 Radiculopathy, lumbar region: Secondary | ICD-10-CM | POA: Diagnosis not present

## 2017-12-20 ENCOUNTER — Ambulatory Visit
Admission: RE | Admit: 2017-12-20 | Discharge: 2017-12-20 | Disposition: A | Discharge status: Home / Self Care | Payer: PPO | Source: Ambulatory Visit | Attending: Family Medicine | Admitting: Family Medicine

## 2017-12-20 DIAGNOSIS — M25562 Pain in left knee: Secondary | ICD-10-CM | POA: Diagnosis not present

## 2017-12-20 DIAGNOSIS — Z1231 Encounter for screening mammogram for malignant neoplasm of breast: Secondary | ICD-10-CM | POA: Diagnosis not present

## 2017-12-26 ENCOUNTER — Other Ambulatory Visit: Payer: Self-pay | Admitting: Family Medicine

## 2017-12-26 DIAGNOSIS — R928 Other abnormal and inconclusive findings on diagnostic imaging of breast: Secondary | ICD-10-CM

## 2017-12-28 ENCOUNTER — Ambulatory Visit
Admission: RE | Admit: 2017-12-28 | Discharge: 2017-12-28 | Disposition: A | Discharge status: Home / Self Care | Payer: PPO | Source: Ambulatory Visit | Attending: Family Medicine | Admitting: Family Medicine

## 2017-12-28 DIAGNOSIS — R928 Other abnormal and inconclusive findings on diagnostic imaging of breast: Secondary | ICD-10-CM | POA: Insufficient documentation

## 2018-01-10 ENCOUNTER — Ambulatory Visit: Payer: PPO | Admitting: Psychiatry

## 2018-01-12 ENCOUNTER — Other Ambulatory Visit: Payer: Self-pay

## 2018-01-12 ENCOUNTER — Ambulatory Visit (INDEPENDENT_AMBULATORY_CARE_PROVIDER_SITE_OTHER): Payer: PPO | Admitting: Psychiatry

## 2018-01-12 ENCOUNTER — Encounter: Payer: Self-pay | Admitting: Psychiatry

## 2018-01-12 VITALS — BP 122/84 | HR 89 | Temp 98.5°F | Wt 221.8 lb

## 2018-01-12 DIAGNOSIS — F332 Major depressive disorder, recurrent severe without psychotic features: Secondary | ICD-10-CM | POA: Diagnosis not present

## 2018-01-12 NOTE — Progress Notes (Signed)
Follow-up patient with chronic depression and anxiety.  She reports she is feeling very good.  No return of any symptoms.  Current medication profile seems to work very well.  Neatly dressed and groomed.  Good eye contact.  Normal psychomotor activity.  Speech and activity normal.  Thoughts lucid.  Patient tolerating medicine.  Blood pressure under good control.  Eating well.  Continue current medication.  Supportive therapy review of treatment plan.  Follow-up 6 months.

## 2018-02-10 DIAGNOSIS — M545 Low back pain: Secondary | ICD-10-CM | POA: Diagnosis not present

## 2018-02-10 DIAGNOSIS — M5416 Radiculopathy, lumbar region: Secondary | ICD-10-CM | POA: Diagnosis not present

## 2018-03-13 DIAGNOSIS — Z79899 Other long term (current) drug therapy: Secondary | ICD-10-CM | POA: Diagnosis not present

## 2018-03-13 DIAGNOSIS — E7800 Pure hypercholesterolemia, unspecified: Secondary | ICD-10-CM | POA: Diagnosis not present

## 2018-03-13 DIAGNOSIS — R739 Hyperglycemia, unspecified: Secondary | ICD-10-CM | POA: Diagnosis not present

## 2018-03-17 DIAGNOSIS — M545 Low back pain: Secondary | ICD-10-CM | POA: Diagnosis not present

## 2018-03-17 DIAGNOSIS — M47816 Spondylosis without myelopathy or radiculopathy, lumbar region: Secondary | ICD-10-CM | POA: Diagnosis not present

## 2018-03-20 DIAGNOSIS — R739 Hyperglycemia, unspecified: Secondary | ICD-10-CM | POA: Diagnosis not present

## 2018-03-20 DIAGNOSIS — M545 Low back pain: Secondary | ICD-10-CM | POA: Diagnosis not present

## 2018-03-20 DIAGNOSIS — E7800 Pure hypercholesterolemia, unspecified: Secondary | ICD-10-CM | POA: Diagnosis not present

## 2018-03-20 DIAGNOSIS — G8929 Other chronic pain: Secondary | ICD-10-CM | POA: Diagnosis not present

## 2018-03-20 DIAGNOSIS — Z79899 Other long term (current) drug therapy: Secondary | ICD-10-CM | POA: Diagnosis not present

## 2018-03-20 DIAGNOSIS — K219 Gastro-esophageal reflux disease without esophagitis: Secondary | ICD-10-CM | POA: Diagnosis not present

## 2018-03-20 DIAGNOSIS — G4733 Obstructive sleep apnea (adult) (pediatric): Secondary | ICD-10-CM | POA: Diagnosis not present

## 2018-03-20 DIAGNOSIS — F419 Anxiety disorder, unspecified: Secondary | ICD-10-CM | POA: Diagnosis not present

## 2018-03-23 DIAGNOSIS — M7062 Trochanteric bursitis, left hip: Secondary | ICD-10-CM | POA: Diagnosis not present

## 2018-03-23 DIAGNOSIS — M7061 Trochanteric bursitis, right hip: Secondary | ICD-10-CM | POA: Diagnosis not present

## 2018-03-28 ENCOUNTER — Ambulatory Visit: Payer: PPO | Admitting: Psychiatry

## 2018-03-28 ENCOUNTER — Encounter: Payer: Self-pay | Admitting: Psychiatry

## 2018-03-28 ENCOUNTER — Other Ambulatory Visit: Payer: Self-pay

## 2018-03-28 VITALS — BP 159/90 | HR 91 | Temp 99.3°F | Wt 211.8 lb

## 2018-03-28 DIAGNOSIS — F332 Major depressive disorder, recurrent severe without psychotic features: Secondary | ICD-10-CM

## 2018-03-28 MED ORDER — FLUOXETINE HCL 40 MG PO CAPS: 40.0000 mg | ORAL_CAPSULE | ORAL | 1 refills | Status: DC

## 2018-03-28 MED ORDER — CLONAZEPAM 1 MG PO TABS: 1.0000 mg | ORAL_TABLET | Freq: Three times a day (TID) | ORAL | 1 refills | Status: DC

## 2018-03-28 MED ORDER — QUETIAPINE FUMARATE 50 MG PO TABS: 50.0000 mg | ORAL_TABLET | Freq: Every day | ORAL | 1 refills | Status: DC

## 2018-03-28 NOTE — Progress Notes (Signed)
Follow-up for this 57 year old woman with bipolar disorder.  Patient says that overall she has been doing reasonably well but recently she has found that she is more irritable and touchy.  Not actually losing her temper no psychosis no suicidal ideation.  Not sleeping as well at night.  Vaguely manic but not with anything grandiose.  Neatly dressed and groomed.  Good eye contact normal psychomotor activity.  Speech normal rate tone and volume.  Affect euthymic.  Thoughts lucid no sign of delirium or delusions.  Discussed possible options.  One option would be to leave things alone but the patient wants some kind of assistance with this.  After reviewing mood stabilizer she has taken in the past I recommend that we use a low dose of Seroquel just 50 mg at night.  She agrees to the plan.  We will give this a try and follow-up in 3 months no change to other medicine.

## 2018-03-29 ENCOUNTER — Telehealth: Payer: Self-pay

## 2018-03-29 ENCOUNTER — Other Ambulatory Visit: Payer: Self-pay | Admitting: Psychiatry

## 2018-03-29 MED ORDER — CLONAZEPAM 1 MG PO TABS: 1.0000 mg | ORAL_TABLET | Freq: Three times a day (TID) | ORAL | 1 refills | Status: DC

## 2018-03-29 MED ORDER — QUETIAPINE FUMARATE 50 MG PO TABS: 50.0000 mg | ORAL_TABLET | Freq: Every day | ORAL | 1 refills | Status: DC

## 2018-03-29 MED ORDER — FLUOXETINE HCL 40 MG PO CAPS: 40.0000 mg | ORAL_CAPSULE | ORAL | 1 refills | Status: DC

## 2018-03-29 NOTE — Telephone Encounter (Signed)
done

## 2018-03-29 NOTE — Telephone Encounter (Signed)
pt called states she needs her medications sent [facility]. the medications can not be transfered it has to be resent to [facility].

## 2018-04-10 DIAGNOSIS — M545 Low back pain: Secondary | ICD-10-CM | POA: Diagnosis not present

## 2018-05-10 DIAGNOSIS — M545 Low back pain: Secondary | ICD-10-CM | POA: Diagnosis not present

## 2018-05-23 DIAGNOSIS — M7062 Trochanteric bursitis, left hip: Secondary | ICD-10-CM | POA: Diagnosis not present

## 2018-05-23 DIAGNOSIS — M545 Low back pain: Secondary | ICD-10-CM | POA: Diagnosis not present

## 2018-05-23 DIAGNOSIS — M47816 Spondylosis without myelopathy or radiculopathy, lumbar region: Secondary | ICD-10-CM | POA: Diagnosis not present

## 2018-05-23 DIAGNOSIS — M7061 Trochanteric bursitis, right hip: Secondary | ICD-10-CM | POA: Diagnosis not present

## 2018-06-15 ENCOUNTER — Other Ambulatory Visit: Payer: Self-pay | Admitting: Pulmonary Disease

## 2018-06-19 DIAGNOSIS — M5116 Intervertebral disc disorders with radiculopathy, lumbar region: Secondary | ICD-10-CM | POA: Diagnosis not present

## 2018-06-19 DIAGNOSIS — M545 Low back pain: Secondary | ICD-10-CM | POA: Diagnosis not present

## 2018-06-27 ENCOUNTER — Ambulatory Visit: Payer: PPO | Admitting: Psychiatry

## 2018-07-11 ENCOUNTER — Ambulatory Visit: Payer: PPO | Admitting: Psychiatry

## 2018-07-13 DIAGNOSIS — M545 Low back pain: Secondary | ICD-10-CM | POA: Diagnosis not present

## 2018-07-13 DIAGNOSIS — M47816 Spondylosis without myelopathy or radiculopathy, lumbar region: Secondary | ICD-10-CM | POA: Diagnosis not present

## 2018-08-10 ENCOUNTER — Ambulatory Visit (INDEPENDENT_AMBULATORY_CARE_PROVIDER_SITE_OTHER): Payer: PPO | Admitting: Psychiatry

## 2018-08-10 ENCOUNTER — Encounter: Payer: Self-pay | Admitting: Psychiatry

## 2018-08-10 DIAGNOSIS — F332 Major depressive disorder, recurrent severe without psychotic features: Secondary | ICD-10-CM | POA: Diagnosis not present

## 2018-08-10 MED ORDER — CLONAZEPAM 1 MG PO TABS: 1.0000 mg | ORAL_TABLET | Freq: Three times a day (TID) | ORAL | 1 refills | Status: DC

## 2018-08-10 MED ORDER — FLUOXETINE HCL 40 MG PO CAPS: 40.0000 mg | ORAL_CAPSULE | ORAL | 1 refills | Status: DC

## 2018-08-10 NOTE — Progress Notes (Signed)
Follow-up patient with depression and mood instability.  Patient tells me she is feeling much better.  She says that last time we met she think she was having situational depression because of problems with her family.  Recently she is feeling much better no suicidal ideation no depression.  Sleeping well.  She took the Seroquel only briefly and then discontinued it because she found it over sedating.  She has just been taking her normal medicine.  Neatly dressed and groomed.  Good eye contact normal psychomotor activity.  Speech normal rate tone and volume.  Affect euthymic.  Thoughts lucid.  No evident suicidality or dangerousness.  Patient does not look like she had anything like a manic episode now.  We will just continue her usual Klonopin and Prozac.  Follow-up 6 months

## 2018-08-15 DIAGNOSIS — M7062 Trochanteric bursitis, left hip: Secondary | ICD-10-CM | POA: Diagnosis not present

## 2018-08-15 DIAGNOSIS — M25561 Pain in right knee: Secondary | ICD-10-CM | POA: Diagnosis not present

## 2018-09-12 DIAGNOSIS — M47816 Spondylosis without myelopathy or radiculopathy, lumbar region: Secondary | ICD-10-CM | POA: Diagnosis not present

## 2018-09-12 DIAGNOSIS — M545 Low back pain: Secondary | ICD-10-CM | POA: Diagnosis not present

## 2019-02-08 ENCOUNTER — Other Ambulatory Visit: Payer: Self-pay

## 2019-02-08 ENCOUNTER — Ambulatory Visit (INDEPENDENT_AMBULATORY_CARE_PROVIDER_SITE_OTHER): Payer: Medicare Other | Admitting: Psychiatry

## 2019-02-08 DIAGNOSIS — F332 Major depressive disorder, recurrent severe without psychotic features: Secondary | ICD-10-CM

## 2019-02-08 MED ORDER — CLONAZEPAM 1 MG PO TABS: 1.0000 mg | ORAL_TABLET | Freq: Three times a day (TID) | ORAL | 1 refills | Status: DC

## 2019-02-08 MED ORDER — FLUOXETINE HCL 20 MG PO CAPS: 60.0000 mg | ORAL_CAPSULE | Freq: Every day | ORAL | 1 refills | Status: DC

## 2019-02-08 MED ORDER — NORTRIPTYLINE HCL 50 MG PO CAPS: 50.0000 mg | ORAL_CAPSULE | Freq: Every day | ORAL | 1 refills | Status: DC

## 2019-02-08 NOTE — Progress Notes (Signed)
Follow-up for this patient with chronic anxiety.  No new complaints.  She has had some relatives passed away in the last few months which is been sad but she is not getting depressed.  No depression no mania.  Medically pretty stable.  Sleeps well at night.  No evidence of psychosis.  Nothing suicidal.  Patient was spoken to by telephone.  Affect euthymic.  Behavior appropriate.  No sign of disorganized thinking.  No complaints of any side effects from medication.  Towards the end of the appointment however the patient said that he did feel like at times her anxiety and depression were a little worse and wanted to know if we could increase her fluoxetine.  I reviewed with her side effects and agreed to try and increase to 60 mg a day.  Continue other medicines as prescribed.  Prescriptions all done including her nortriptyline.  Follow-up in another 6 months.

## 2019-02-09 ENCOUNTER — Other Ambulatory Visit: Payer: Self-pay | Admitting: Otolaryngology

## 2019-02-09 DIAGNOSIS — E0590 Thyrotoxicosis, unspecified without thyrotoxic crisis or storm: Secondary | ICD-10-CM

## 2019-02-19 ENCOUNTER — Other Ambulatory Visit: Payer: Self-pay

## 2019-02-19 ENCOUNTER — Ambulatory Visit
Admission: RE | Admit: 2019-02-19 | Discharge: 2019-02-19 | Disposition: A | Discharge status: Home / Self Care | Payer: Medicare Other | Source: Ambulatory Visit | Attending: Otolaryngology | Admitting: Otolaryngology

## 2019-02-19 DIAGNOSIS — E0590 Thyrotoxicosis, unspecified without thyrotoxic crisis or storm: Secondary | ICD-10-CM

## 2019-02-19 IMAGING — MG MM DIGITAL SCREENING BILAT W/ TOMO W/ CAD
8 series · 8 of 24 positions shown · non-contrast
Comparison: Previous exam(s).

CLINICAL DATA: Screening.

EXAM:
DIGITAL SCREENING BILATERAL MAMMOGRAM WITH TOMO AND CAD

[L CC synth-2D]
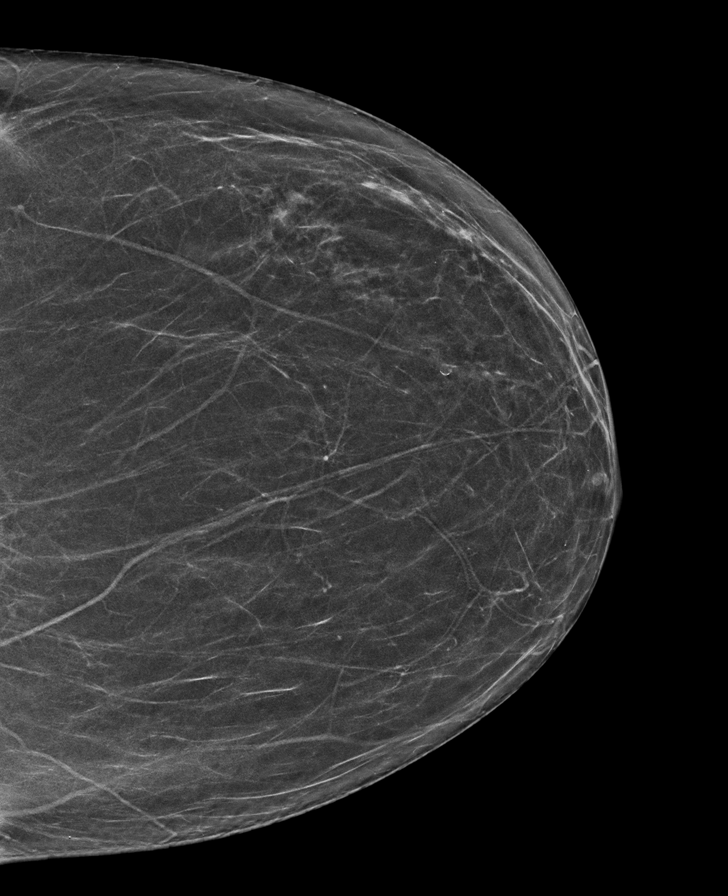

[R CC synth-2D]
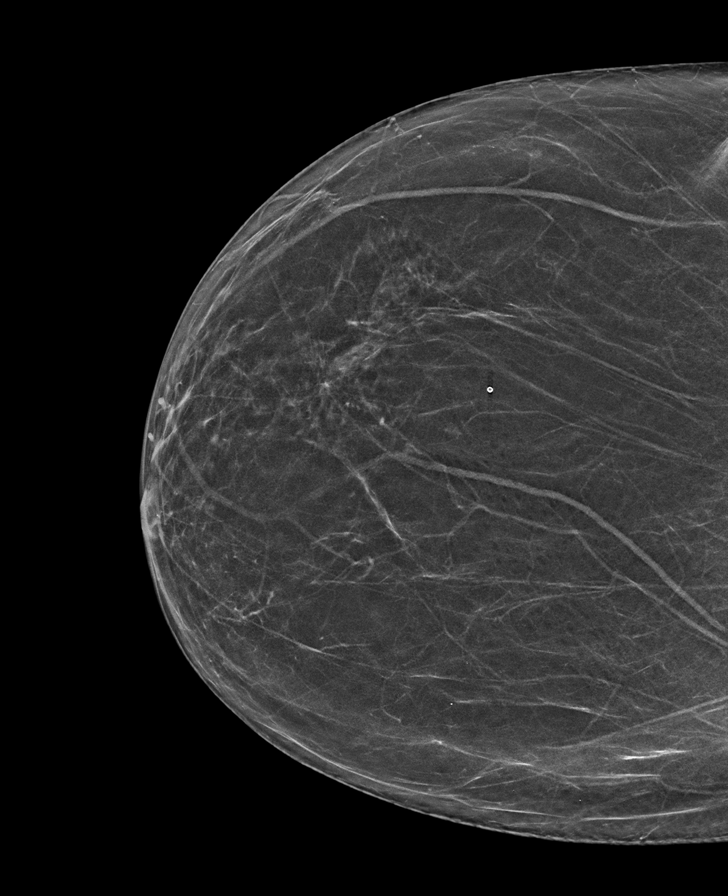

[L MLO synth-2D]
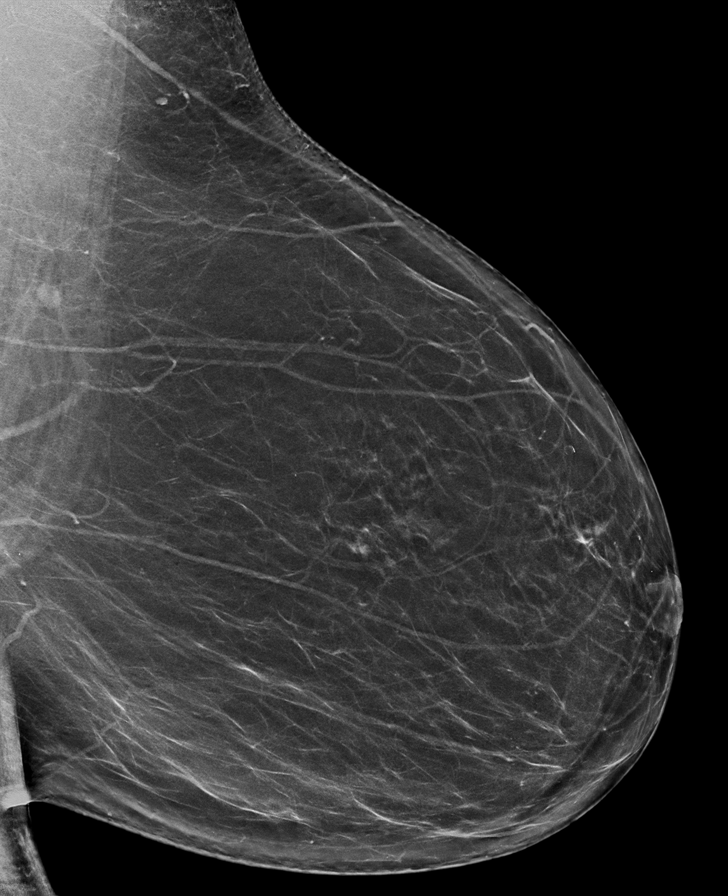

[R MLO synth-2D]
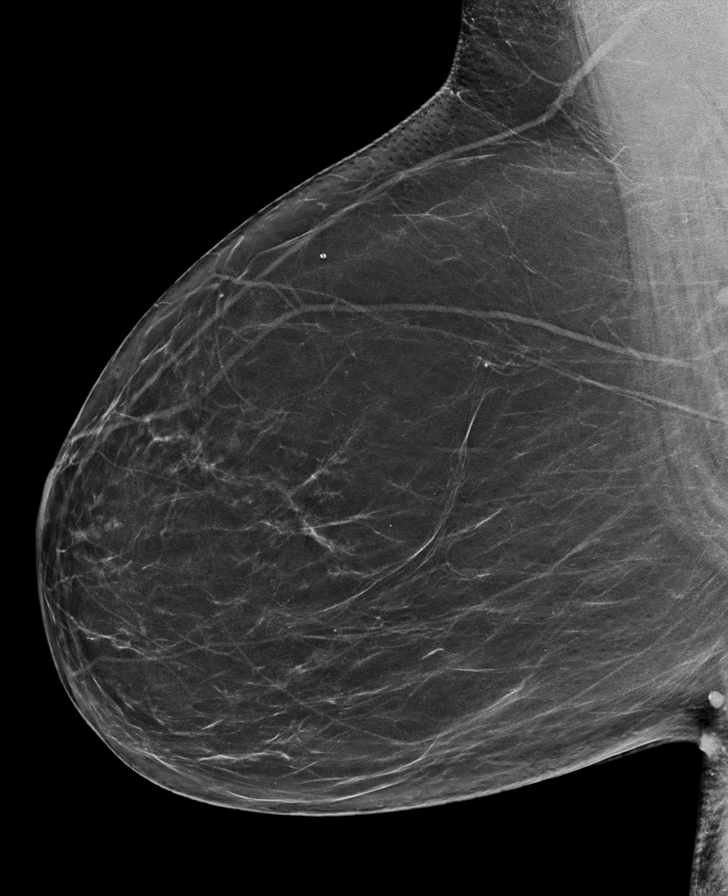

[R CC tomo · tomo slice 32/63.0]
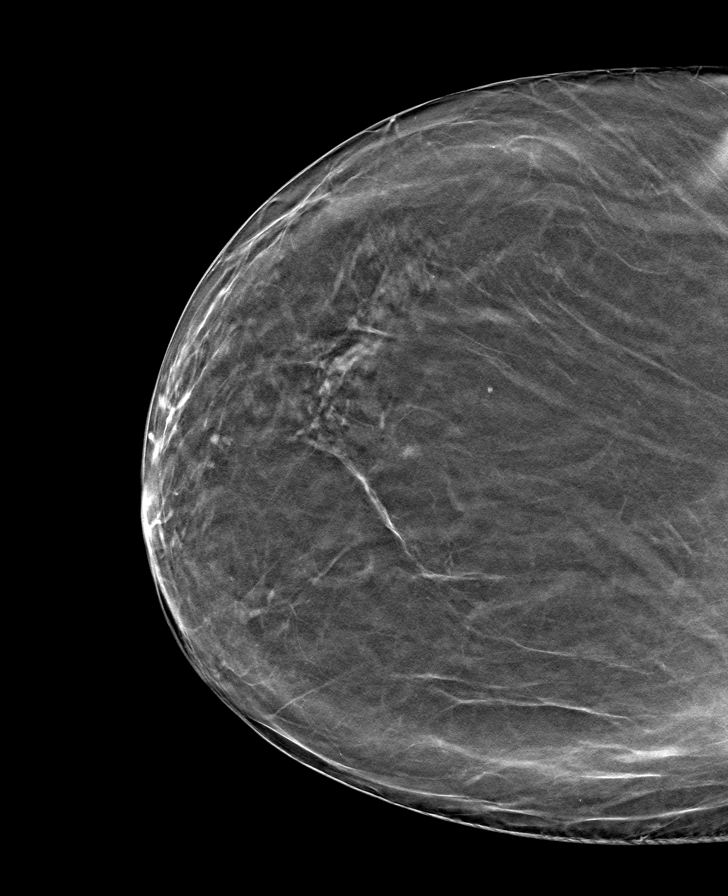

[L MLO tomo · tomo slice 38/75.0]
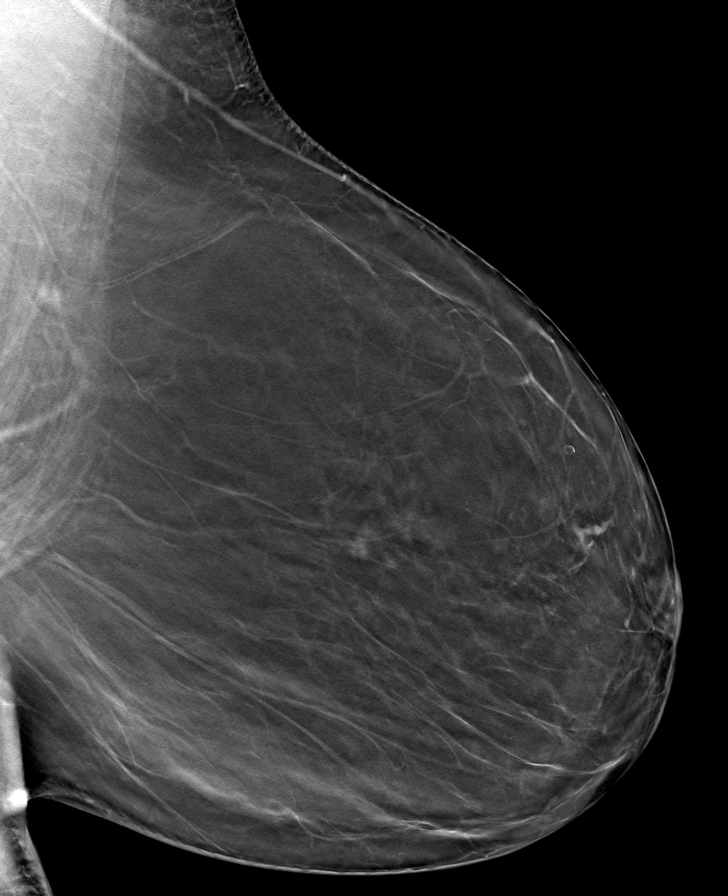

[R MLO tomo · tomo slice 37/73.0]
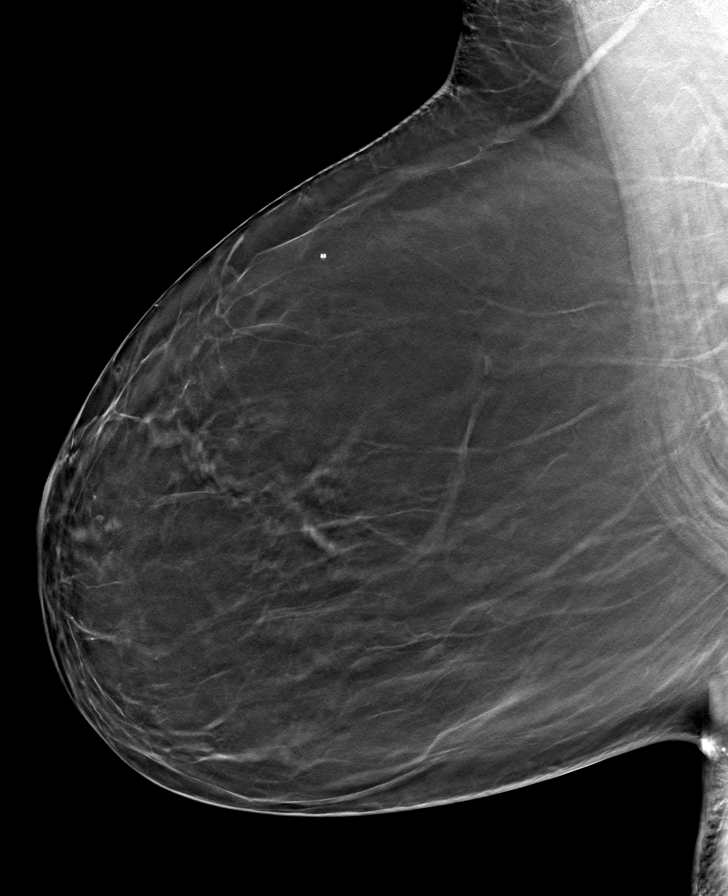

[L CC tomo · tomo slice 33/64.0]
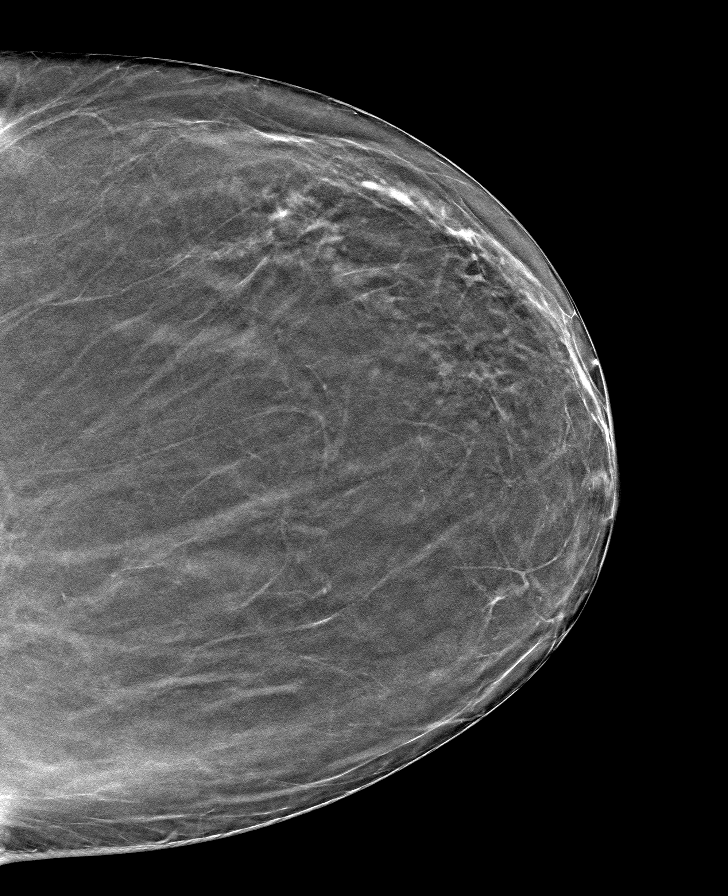

[8 of 24 positions shown; findings below may reference images not displayed]

ACR Breast Density Category b: There are scattered areas of
fibroglandular density.
FINDINGS: In the right breast, a possible mass warrants further evaluation. In
the left breast, no findings suspicious for malignancy. Images were
processed with CAD.
IMPRESSION: Further evaluation is suggested for possible mass in the right
breast.

RECOMMENDATION:
Ultrasound of the right breast. (Code:D9-9-UU2)

The patient will be contacted regarding the findings, and additional
imaging will be scheduled.

BI-RADS CATEGORY  0: Incomplete. Need additional imaging evaluation
and/or prior mammograms for comparison.

## 2019-02-20 ENCOUNTER — Other Ambulatory Visit: Payer: Self-pay | Admitting: Family Medicine

## 2019-02-20 DIAGNOSIS — Z1231 Encounter for screening mammogram for malignant neoplasm of breast: Secondary | ICD-10-CM

## 2019-03-14 ENCOUNTER — Other Ambulatory Visit: Payer: Self-pay | Admitting: Otolaryngology

## 2019-03-14 DIAGNOSIS — R1310 Dysphagia, unspecified: Secondary | ICD-10-CM

## 2019-03-16 ENCOUNTER — Other Ambulatory Visit: Payer: Self-pay

## 2019-03-16 ENCOUNTER — Ambulatory Visit
Admission: RE | Admit: 2019-03-16 | Discharge: 2019-03-16 | Disposition: A | Discharge status: Home / Self Care | Payer: Medicare Other | Source: Ambulatory Visit | Attending: Otolaryngology | Admitting: Otolaryngology

## 2019-03-16 DIAGNOSIS — R1310 Dysphagia, unspecified: Secondary | ICD-10-CM | POA: Diagnosis not present

## 2019-04-06 ENCOUNTER — Ambulatory Visit
Admission: RE | Admit: 2019-04-06 | Discharge: 2019-04-06 | Disposition: A | Discharge status: Home / Self Care | Payer: Medicare Other | Source: Ambulatory Visit | Attending: Family Medicine | Admitting: Family Medicine

## 2019-04-06 DIAGNOSIS — Z1231 Encounter for screening mammogram for malignant neoplasm of breast: Secondary | ICD-10-CM | POA: Insufficient documentation

## 2019-08-02 ENCOUNTER — Other Ambulatory Visit: Payer: Self-pay

## 2019-08-02 ENCOUNTER — Ambulatory Visit (INDEPENDENT_AMBULATORY_CARE_PROVIDER_SITE_OTHER): Payer: Medicare Other | Admitting: Psychiatry

## 2019-08-02 ENCOUNTER — Encounter: Payer: Self-pay | Admitting: Psychiatry

## 2019-08-02 DIAGNOSIS — F332 Major depressive disorder, recurrent severe without psychotic features: Secondary | ICD-10-CM

## 2019-08-02 MED ORDER — FLUOXETINE HCL 20 MG PO CAPS: 60.0000 mg | ORAL_CAPSULE | Freq: Every day | ORAL | 1 refills | Status: DC

## 2019-08-02 MED ORDER — NORTRIPTYLINE HCL 50 MG PO CAPS: 50.0000 mg | ORAL_CAPSULE | Freq: Every day | ORAL | 1 refills | Status: DC

## 2019-08-02 MED ORDER — CLONAZEPAM 1 MG PO TABS: 1.0000 mg | ORAL_TABLET | Freq: Three times a day (TID) | ORAL | 1 refills | Status: DC

## 2019-08-02 NOTE — Progress Notes (Signed)
Follow-up note for this patient with chronic anxiety.  Patient was reached by telephone.  She was appropriate and cooperative.  Identified patient on the phone.  Patient has no new complaints.  She says the increased dose of fluoxetine has been quite helpful for her anxiety.  She is having less anxiety than she was before and feels like she has had no side effects from it.  She is still struggling with some physical pain but is getting that worked up appropriately.  Sleeping well.  No symptoms of depression.  Patient was alert and oriented appropriate on the telephone.  Affect euthymic thoughts appeared to be lucid.  No sign of disorganized thinking.  Reviewed medicines including nortriptyline fluoxetine and clonazepam.  Refilled everything for a total of 6 months at [facility].  Follow-up 6 months.  Patient agreeable.

## 2019-08-03 ENCOUNTER — Other Ambulatory Visit: Payer: Self-pay | Admitting: Physical Medicine and Rehabilitation

## 2019-08-03 DIAGNOSIS — M5124 Other intervertebral disc displacement, thoracic region: Secondary | ICD-10-CM

## 2019-08-03 DIAGNOSIS — M5134 Other intervertebral disc degeneration, thoracic region: Secondary | ICD-10-CM

## 2019-08-10 ENCOUNTER — Ambulatory Visit
Admission: RE | Admit: 2019-08-10 | Discharge: 2019-08-10 | Disposition: A | Discharge status: Home / Self Care | Payer: Medicare Other | Source: Ambulatory Visit | Attending: Physical Medicine and Rehabilitation | Admitting: Physical Medicine and Rehabilitation

## 2019-08-10 DIAGNOSIS — M5124 Other intervertebral disc displacement, thoracic region: Secondary | ICD-10-CM

## 2019-08-10 DIAGNOSIS — M5134 Other intervertebral disc degeneration, thoracic region: Secondary | ICD-10-CM

## 2019-08-10 MED ORDER — IOPAMIDOL (ISOVUE-M 300) INJECTION 61%
1.0000 mL | Freq: Once | INTRAMUSCULAR | Status: AC
  Administered 2019-08-10: 1 mL via EPIDURAL

## 2019-08-10 MED ORDER — TRIAMCINOLONE ACETONIDE 40 MG/ML IJ SUSP (RADIOLOGY)
60.0000 mg | Freq: Once | INTRAMUSCULAR | Status: AC
  Administered 2019-08-10: 60 mg via EPIDURAL

## 2019-09-16 ENCOUNTER — Ambulatory Visit: Payer: Medicare Other | Attending: Internal Medicine

## 2019-09-16 DIAGNOSIS — Z23 Encounter for immunization: Secondary | ICD-10-CM

## 2019-09-16 NOTE — Progress Notes (Signed)
   Covid-19 Vaccination Clinic  Name:  Denise Spencer    MRN: [id number] DOB: 09-Feb-1961  09/16/2019  Denise Spencer was observed post Covid-19 immunization for 15 minutes without incident. She was provided with Vaccine Information Sheet and instruction to access the V-Safe system.   Denise Spencer was instructed to call 911 with any severe reactions post vaccine: Marland Kitchen Difficulty breathing  . Swelling of face and throat  . A fast heartbeat  . A bad rash all over body  . Dizziness and weakness   Immunizations Administered    Name Date Dose VIS Date Route   Pfizer COVID-19 Vaccine 09/16/2019  1:31 PM 0.3 mL 06/01/2019 Intramuscular   Manufacturer: [employer], [employer]   Lot: [id number]   [place]: [id number]

## 2019-10-09 ENCOUNTER — Other Ambulatory Visit: Payer: Self-pay

## 2019-10-09 ENCOUNTER — Encounter: Payer: Self-pay | Admitting: Psychiatry

## 2019-10-09 ENCOUNTER — Telehealth (INDEPENDENT_AMBULATORY_CARE_PROVIDER_SITE_OTHER): Payer: Medicare Other | Admitting: Psychiatry

## 2019-10-09 DIAGNOSIS — F332 Major depressive disorder, recurrent severe without psychotic features: Secondary | ICD-10-CM

## 2019-10-09 MED ORDER — CLONAZEPAM 1 MG PO TABS: 1.0000 mg | ORAL_TABLET | Freq: Four times a day (QID) | ORAL | 1 refills | Status: DC | PRN

## 2019-10-09 NOTE — Progress Notes (Signed)
Follow-up for this 59 year old woman with depression and anxiety.  Patient had made an appointment earlier than previously scheduled.  Spoke with patient on the telephone.  Identified myself and confirmed her identity.  Patient reports that she has been much more anxious recently.  Significant increase in social problems related to her daughter and her daughter's behavior.  Patient is not feeling depressed and is not having suicidal thoughts.  She is specifically requesting the option of taking an extra clonazepam pill every day.  Alert and oriented.  Denies suicidal or homicidal ideation.  No sign of disorganized thinking or psychosis.  Patient is fully oriented and has good insight and judgment.  I discussed the risks of increased clonazepam including memory impairment confusion and sedation.  Agreed to increase dose to potentially 4 a day as needed but strongly encouraged her to try not to take the pill every day.  Patient expresses agreement.  We will check up again as previously scheduled at the 72-month mark.

## 2019-10-10 ENCOUNTER — Ambulatory Visit: Payer: Medicare Other | Attending: Internal Medicine

## 2019-10-10 DIAGNOSIS — Z23 Encounter for immunization: Secondary | ICD-10-CM

## 2019-10-10 NOTE — Progress Notes (Signed)
   Covid-19 Vaccination Clinic  Name:  Denise Spencer    MRN: [id number] DOB: 05-08-1961  10/10/2019  Denise Spencer was observed post Covid-19 immunization for 15 minutes without incident. She was provided with Vaccine Information Sheet and instruction to access the V-Safe system.   Denise Spencer was instructed to call 911 with any severe reactions post vaccine: Marland Kitchen Difficulty breathing  . Swelling of face and throat  . A fast heartbeat  . A bad rash all over body  . Dizziness and weakness   Immunizations Administered    Name Date Dose VIS Date Route   Pfizer COVID-19 Vaccine 10/10/2019 10:15 AM 0.3 mL 08/15/2018 Intramuscular   Manufacturer: [employer], [employer]   Lot: [id number]   [place]: [id number]

## 2020-01-11 DIAGNOSIS — M1612 Unilateral primary osteoarthritis, left hip: Secondary | ICD-10-CM | POA: Insufficient documentation

## 2020-01-31 ENCOUNTER — Ambulatory Visit: Payer: Medicare Other | Admitting: Psychiatry

## 2020-03-04 ENCOUNTER — Other Ambulatory Visit: Payer: Self-pay | Admitting: Psychiatry

## 2020-03-10 LAB — COLOGUARD: COLOGUARD: NEGATIVE

## 2020-04-01 ENCOUNTER — Encounter: Payer: Self-pay | Admitting: Psychiatry

## 2020-04-01 ENCOUNTER — Other Ambulatory Visit: Payer: Self-pay

## 2020-04-01 ENCOUNTER — Telehealth (INDEPENDENT_AMBULATORY_CARE_PROVIDER_SITE_OTHER): Payer: Medicare Other | Admitting: Psychiatry

## 2020-04-01 DIAGNOSIS — F332 Major depressive disorder, recurrent severe without psychotic features: Secondary | ICD-10-CM

## 2020-04-01 MED ORDER — NORTRIPTYLINE HCL 50 MG PO CAPS: 50.0000 mg | ORAL_CAPSULE | Freq: Every day | ORAL | 1 refills | Status: DC

## 2020-04-01 MED ORDER — FLUOXETINE HCL 20 MG PO CAPS: 60.0000 mg | ORAL_CAPSULE | Freq: Every day | ORAL | 1 refills | Status: DC

## 2020-04-01 MED ORDER — CLONAZEPAM 1 MG PO TABS: 1.0000 mg | ORAL_TABLET | Freq: Four times a day (QID) | ORAL | 1 refills | Status: DC | PRN

## 2020-04-01 NOTE — Progress Notes (Signed)
Follow-up note patient with chronic depression and anxiety.  Patient reached by telephone.  Established our identities.  Total time on the telephone 20 minutes.  Patient is denies any new symptoms.  Reports mood is been stable without a return of major depression.  Sleeping adequately.  Eating adequately.  Denies suicidal ideation.  Anxious about getting a hip replacement next month.  Alert and oriented.  Appropriate interaction.  Thoughts lucid.  No loosening of associations no delusions.  Denies suicidal thoughts.  Good insight and judgment.  Reviewed medication.  Refill medication.  Follow-up 6 months.

## 2020-04-21 HISTORY — PX: HIP ARTHROPLASTY: SHX981

## 2020-04-25 DIAGNOSIS — Z96642 Presence of left artificial hip joint: Secondary | ICD-10-CM | POA: Insufficient documentation

## 2020-07-02 ENCOUNTER — Other Ambulatory Visit: Payer: Self-pay | Admitting: Psychiatry

## 2020-07-02 ENCOUNTER — Telehealth: Payer: Medicare Other | Admitting: *Deleted

## 2020-07-02 MED ORDER — FLUOXETINE HCL 40 MG PO CAPS: 80.0000 mg | ORAL_CAPSULE | Freq: Every day | ORAL | 5 refills | Status: DC

## 2020-07-02 NOTE — Telephone Encounter (Signed)
ent called and stated her daughter died in 06-May-2023.  She would like to increase her prozac to 80 mg.  Please review.

## 2020-07-18 ENCOUNTER — Encounter (INDEPENDENT_AMBULATORY_CARE_PROVIDER_SITE_OTHER): Payer: Self-pay

## 2020-07-28 DIAGNOSIS — Z96642 Presence of left artificial hip joint: Secondary | ICD-10-CM | POA: Diagnosis not present

## 2020-07-28 DIAGNOSIS — M1712 Unilateral primary osteoarthritis, left knee: Secondary | ICD-10-CM | POA: Diagnosis not present

## 2020-07-28 DIAGNOSIS — Z471 Aftercare following joint replacement surgery: Secondary | ICD-10-CM | POA: Diagnosis not present

## 2020-09-05 DIAGNOSIS — J0100 Acute maxillary sinusitis, unspecified: Secondary | ICD-10-CM | POA: Diagnosis not present

## 2020-09-16 DIAGNOSIS — R739 Hyperglycemia, unspecified: Secondary | ICD-10-CM | POA: Diagnosis not present

## 2020-09-16 DIAGNOSIS — N1831 Chronic kidney disease, stage 3a: Secondary | ICD-10-CM | POA: Diagnosis not present

## 2020-09-16 DIAGNOSIS — E7800 Pure hypercholesterolemia, unspecified: Secondary | ICD-10-CM | POA: Diagnosis not present

## 2020-09-17 DIAGNOSIS — Z471 Aftercare following joint replacement surgery: Secondary | ICD-10-CM | POA: Diagnosis not present

## 2020-09-17 DIAGNOSIS — Z96642 Presence of left artificial hip joint: Secondary | ICD-10-CM | POA: Diagnosis not present

## 2020-10-09 ENCOUNTER — Encounter (INDEPENDENT_AMBULATORY_CARE_PROVIDER_SITE_OTHER): Payer: Self-pay

## 2020-10-11 ENCOUNTER — Other Ambulatory Visit: Payer: Self-pay | Admitting: Psychiatry

## 2020-10-15 ENCOUNTER — Other Ambulatory Visit: Payer: Self-pay | Admitting: Psychiatry

## 2020-10-15 MED ORDER — NORTRIPTYLINE HCL 50 MG PO CAPS: 50.0000 mg | ORAL_CAPSULE | Freq: Every day | ORAL | 1 refills | Status: DC

## 2020-10-15 MED ORDER — FLUOXETINE HCL 40 MG PO CAPS: 80.0000 mg | ORAL_CAPSULE | Freq: Every day | ORAL | 5 refills | Status: DC

## 2020-10-15 MED ORDER — CLONAZEPAM 1 MG PO TABS: 1.0000 mg | ORAL_TABLET | Freq: Four times a day (QID) | ORAL | 1 refills | Status: DC | PRN

## 2020-10-21 ENCOUNTER — Other Ambulatory Visit: Payer: Self-pay

## 2020-10-21 ENCOUNTER — Telehealth (INDEPENDENT_AMBULATORY_CARE_PROVIDER_SITE_OTHER): Payer: Medicare Other | Admitting: Psychiatry

## 2020-10-21 DIAGNOSIS — F332 Major depressive disorder, recurrent severe without psychotic features: Secondary | ICD-10-CM

## 2020-10-21 NOTE — Progress Notes (Signed)
Virtual Visit via Telephone Note  I connected with Denise Spencer on 10/21/20 at  1:00 PM EDT by telephone and verified that I am speaking with the correct person using two identifiers.  Location: Patient: Home Provider: Hospital   I discussed the limitations, risks, security and privacy concerns of performing an evaluation and management service by telephone and the availability of in person appointments. I also discussed with the patient that there may be a patient responsible charge related to this service. The patient expressed understanding and agreed to proceed.   History of Present Illness: Patient reached by telephone.  Identified self and patient.  This is a follow-up appointment for this 59 year old woman with a history of depression and anxiety.  She had no new complaints today.  She reported that in the last 6 months things have been going fairly well for her.  She did have her hip replacement and is still having a little bit of pain with it but emotionally doing well.  No return of depression.  No suicidal thought no psychosis.  States that she takes her clonazepam usually less than 4 times a day and is not finding herself having any side effects or problems with it.    Observations/Objective: Patient was attentive on time and appropriate.  Thoughts were lucid and well directed.  Mood was stated as good.  Affect sounded reactive and upbeat.  Denied suicidal thoughts or psychosis.  Good insight.   Assessment and Plan: Continue with current medications including fluoxetine nortriptyline and clonazepam all of which have been renewed for 6 months   Follow Up Instructions: Call for 34-month follow-up appointment    I discussed the assessment and treatment plan with the patient. The patient was provided an opportunity to ask questions and all were answered. The patient agreed with the plan and demonstrated an understanding of the instructions.   The patient was advised to call back  or seek an in-person evaluation if the symptoms worsen or if the condition fails to improve as anticipated.  I provided 20 minutes of non-face-to-face time during this encounter.   Alethia Berthold, MD

## 2020-10-22 DIAGNOSIS — G8929 Other chronic pain: Secondary | ICD-10-CM | POA: Insufficient documentation

## 2020-10-22 DIAGNOSIS — M545 Low back pain, unspecified: Secondary | ICD-10-CM | POA: Insufficient documentation

## 2020-12-26 ENCOUNTER — Other Ambulatory Visit: Payer: Self-pay | Admitting: Psychiatry

## 2021-02-16 ENCOUNTER — Telehealth (HOSPITAL_COMMUNITY): Payer: Self-pay

## 2021-02-16 NOTE — Telephone Encounter (Signed)
Medication management - Telephone message left for pt to inform her message was received with desire to let Dr. Weber Cooks know she has been more depressed and agitated lately and is requesting a call back from him.  Informed pt this message would be sent and to call our office back if any worsening issues or concerns.

## 2021-02-17 ENCOUNTER — Ambulatory Visit: Payer: Medicare Other | Admitting: Gastroenterology

## 2021-03-03 ENCOUNTER — Telehealth (HOSPITAL_BASED_OUTPATIENT_CLINIC_OR_DEPARTMENT_OTHER): Payer: No Typology Code available for payment source | Admitting: Psychiatry

## 2021-03-03 ENCOUNTER — Other Ambulatory Visit: Payer: Self-pay

## 2021-03-03 DIAGNOSIS — F332 Major depressive disorder, recurrent severe without psychotic features: Secondary | ICD-10-CM | POA: Diagnosis not present

## 2021-03-03 MED ORDER — SERTRALINE HCL 50 MG PO TABS: 50.0000 mg | ORAL_TABLET | Freq: Every day | ORAL | 0 refills | Status: DC

## 2021-03-03 MED ORDER — SERTRALINE HCL 100 MG PO TABS
100.0000 mg | ORAL_TABLET | Freq: Every day | ORAL | 5 refills | Status: DC
Start: 2021-03-03 — End: 2021-04-23

## 2021-03-03 NOTE — Progress Notes (Signed)
Virtual Visit via Telephone Note  I connected with Denise Spencer on 03/03/21 at  2:20 PM EDT by telephone and verified that I am speaking with the correct person using two identifiers.  Location: Patient: Home Provider: Hospital   I discussed the limitations, risks, security and privacy concerns of performing an evaluation and management service by telephone and the availability of in person appointments. I also discussed with the patient that there may be a patient responsible charge related to this service. The patient expressed understanding and agreed to proceed.   History of Present Illness: Patient reached by telephone.  Established identities.  Patient had made this appointment early because she has been feeling worse.  Since our last conversation her husband has been diagnosed with cancer.  This is on top of her chronic anxiety and stress she is feeling much worse.  No suicidal thoughts no psychosis.  More nervous.  Trouble concentrating.  Dysphoric tearful.    Observations/Objective: Patient was lucid.  No disorganized thinking.  No expressions of suicidal thought.  Good insight and judgment.  Dysphoric affect but did not sound tearful or overwrought   Assessment and Plan: We went back and reviewed medications over the years.  Patient has been on her current dose of fluoxetine for a long time.  She could not remember and neither could I what medicine she might have been taking as primary treatment prior to the fluoxetine.  She is very interested in changing medicines.  I suggested Lexapro but she has apparently been on that in the past and did not care for it.  Reviewed a couple of options including switching medicines or adding buspirone.  Given that she is already on so much medicine I suggested making a change.  We will try switching to Zoloft.  Begin Zoloft 50 mg once a day while decreasing fluoxetine to 40 mg a day.  After a month increase Zoloft to 100 mg a day while discontinuing  fluoxetine.  Appointment made to follow up in 2 months.   Follow Up Instructions:    I discussed the assessment and treatment plan with the patient. The patient was provided an opportunity to ask questions and all were answered. The patient agreed with the plan and demonstrated an understanding of the instructions.   The patient was advised to call back or seek an in-person evaluation if the symptoms worsen or if the condition fails to improve as anticipated.  I provided 20 minutes of non-face-to-face time during this encounter.   Alethia Berthold, MD

## 2021-03-04 ENCOUNTER — Other Ambulatory Visit: Payer: Self-pay | Admitting: Family Medicine

## 2021-03-04 DIAGNOSIS — Z1231 Encounter for screening mammogram for malignant neoplasm of breast: Secondary | ICD-10-CM

## 2021-03-05 ENCOUNTER — Telehealth: Payer: Self-pay

## 2021-03-05 ENCOUNTER — Other Ambulatory Visit: Payer: Self-pay | Admitting: Psychiatry

## 2021-03-05 NOTE — Telephone Encounter (Signed)
pt called left a message that this was the third time she called and no one called her back. she wanted to know why dr. Weber Cooks did not call in the buspar >

## 2021-03-05 NOTE — Telephone Encounter (Signed)
i sent dr clapacs a message and he responded back by  (she misunderstood. We decided not to do buspar. She is to take zoloft '50mg'$  daily for one month then increase to '100mg'$ )  I tried to call patient but the phone number she gave will not let you go threw.   I told Dr. Weber Cooks that I would copy his message to a phone message just in case pt calls while I'm out tomorrow

## 2021-03-06 NOTE — Telephone Encounter (Signed)
Medication management -Tried several times today to contact pt back at [phone] provided phone number, even after pt left another message with this number,  but the call would not go through so placed a secure email to pt with Dr.[name] instructions.  Requested patient to keep trying [name] back if questions but to also know her phone is not working at this time.

## 2021-03-23 ENCOUNTER — Other Ambulatory Visit: Payer: Self-pay

## 2021-03-23 ENCOUNTER — Ambulatory Visit
Admission: RE | Admit: 2021-03-23 | Discharge: 2021-03-23 | Disposition: A | Discharge status: Home / Self Care | Payer: Medicare Other | Source: Ambulatory Visit | Attending: Family Medicine | Admitting: Family Medicine

## 2021-03-23 DIAGNOSIS — Z1231 Encounter for screening mammogram for malignant neoplasm of breast: Secondary | ICD-10-CM | POA: Insufficient documentation

## 2021-03-25 DIAGNOSIS — M7072 Other bursitis of hip, left hip: Secondary | ICD-10-CM | POA: Insufficient documentation

## 2021-04-20 NOTE — Progress Notes (Signed)
Gastroenterology Consultation  Referring Provider:     [name], MD Primary Care Physician:  Denise Late, MD Primary Gastroenterologist:  [name]     Reason for Consultation:     Change in bowel habits        HPI:   Denise Spencer is a 60 y.o. y/o female referred for consultation & management of change in bowel habits by Dr. [name], MD.  This patient comes in today with a history of having colon polyps in 2018 with a repeat colonoscopy suggested in 4 years.  The patient reports that she used to have to run to the bathroom after eating and then have diarrhea.  She states that after that would happen she would then have a lot of constipation.  She reports that now the symptoms have changed where she is just constipated all the time.  She has taken some stool softeners and states that it has worked for her.  There is no report of any unexplained weight loss fevers chills nausea or vomiting.  The patient has had some stress with her husband recently being diagnosed with multiple myeloma and she had a daughter who committed suicide.     Past Medical History:  Diagnosis Date   Anxiety    Arthritis    knees, lower back   Bipolar disorder (HCC)    Bulging lumbar disc    L5-S1   COPD (chronic obstructive pulmonary disease) (HCC)    MILD   Depression    Dyspnea    Fibromyalgia    GERD (gastroesophageal reflux disease)    PONV (postoperative nausea and vomiting)    with hysterectomy only   Sleep apnea    CPAP   Wears dentures    full upper and lower    Past Surgical History:  Procedure Laterality Date   ABDOMINAL HYSTERECTOMY     ABDOMINAL HYSTERECTOMY W/ PARTIAL VAGINACTOMY     CESAREAN SECTION     COLONOSCOPY     COLONOSCOPY WITH PROPOFOL N/A 11/01/2016   Procedure: COLONOSCOPY WITH PROPOFOL;  Surgeon: [name], MD;  Location: [facility];  Service: Endoscopy;  Laterality: N/A;   ESOPHAGOGASTRODUODENOSCOPY (EGD) WITH PROPOFOL N/A 11/01/2016    Procedure: ESOPHAGOGASTRODUODENOSCOPY (EGD) WITH PROPOFOL;  Surgeon: [name], MD;  Location: [facility];  Service: Endoscopy;  Laterality: N/A;   POLYPECTOMY  11/01/2016   Procedure: POLYPECTOMY;  Surgeon: [name], MD;  Location: [facility];  Service: Endoscopy;;   SEPTOPLASTY Left 12/27/2016   Procedure: SEPTOPLASTY,ENDOSCOPIC TRIMMING MIDDLE TURBINATE;  Surgeon: Margaretha Sheffield, MD;  Location: [facility];  Service: ENT;  Laterality: Left;   WRIST GANGLION EXCISION      Prior to Admission medications   Medication Sig Start Date End Date Taking? Authorizing Provider  albuterol (PROVENTIL HFA;VENTOLIN HFA) 108 (90 Base) MCG/ACT inhaler INHALE 2 PUFFS BY MOUTH EVERY 6 HOURS ASNEEDED FOR WHEEZING OR SHORTNESS OF BREATH 06/16/18   Wilhelmina Mcardle, MD  Cholecalciferol (VITAMIN D-3) 5000 units TABS Take 1 tablet by mouth daily.    [provider]  clonazePAM (KLONOPIN) 1 MG tablet TAKE 1 TABLET BY MOUTH FOUR TIMES DAILY 01/12/21   [name], [name], MD  clonazePAM (KLONOPIN) 1 MG tablet Take 1 tablet (1 mg total) by mouth 4 (four) times daily as needed for anxiety. 10/15/20   [name], [name], MD  cloNIDine (CATAPRES) 0.1 MG tablet Take 0.1 mg by mouth 2 (two) times daily.    [provider]  colchicine 0.6 MG tablet Take 0.6 mg by mouth 2 (two) times daily.     [provider]  FLUoxetine (PROZAC) 40 MG capsule Take 2 capsules (80 mg total) by mouth daily. 10/15/20   [name], [name], MD  gabapentin (NEURONTIN) 300 MG capsule Take 300 mg by mouth 2 (two) times daily. 300 MG IN AM AND 600 MG AT BEDTIME 11/30/14   [provider]  nortriptyline (PAMELOR) 50 MG capsule TAKE 1 CAPSULE BY MOUTH EVERY NIGHT AT BEDTIME 01/12/21   [name], [name], MD  sertraline (ZOLOFT) 100 MG tablet Take 1 tablet (100 mg total) by mouth daily. 03/03/21 08/30/21  [name], [name], MD  sertraline (ZOLOFT) 50 MG tablet Take 1 tablet (50 mg total) by mouth daily. 03/03/21   [name],  [name], MD  simvastatin (ZOCOR) 10 MG tablet Take 10 mg by mouth every evening.  01/17/14   [provider]    Family History  Problem Relation Age of Onset   Breast cancer Mother 2   Bipolar disorder Mother    Depression Mother    Anxiety disorder Mother    Heart attack Father    Thyroid cancer Father    Hypertension Father    Depression Sister    Multiple sclerosis Sister    Bipolar disorder Sister    Hypertension Sister    Bipolar disorder Sister    Lymphoma Sister    Anxiety disorder Sister    Bipolar disorder Sister    Multiple sclerosis Other      Social History   Tobacco Use   Smoking status: Former    Packs/day: 1.00    Years: 40.00    Pack years: 40.00    Types: Cigarettes    Start date: 05/04/1977    Quit date: 06/25/2016    Years since quitting: 4.8   Smokeless tobacco: Never   Tobacco comments:       Vaping Use   Vaping Use: Never used  Substance Use Topics   Alcohol use: No    Alcohol/week: 0.0 standard drinks   Drug use: No    Allergies as of 04/21/2021 - Review Complete 04/01/2020  Allergen Reaction Noted   Advair diskus [fluticasone-salmeterol]  09/16/2016   Amoxicillin  12/14/2016   Aspirin  12/20/2014   Breo ellipta [fluticasone furoate-vilanterol]  09/16/2016   Nsaids Nausea And Vomiting 12/20/2014   Tape  09/16/2016   Ciprofloxacin Rash 01/01/2015   Doxycycline Rash 12/20/2014   Erythromycin Rash 12/20/2014   Penicillins Rash 12/20/2014   Sulfa antibiotics Rash 01/01/2015    Review of Systems:    All systems reviewed and negative except where noted in HPI.   Physical Exam:  There were no vitals taken for this visit. No LMP recorded. Patient has had a hysterectomy. General:   Alert,  Well-developed, well-nourished, pleasant and cooperative in NAD Head:  Normocephalic and atraumatic. Eyes:  Sclera clear, no icterus.   Conjunctiva pink. Ears:  Normal auditory acuity. Neck:  Supple; no masses or thyromegaly. Lungs:   Respirations even and unlabored.  Clear throughout to auscultation.   No wheezes, crackles, or rhonchi. No acute distress. Heart:  Regular rate and rhythm; no murmurs, clicks, rubs, or gallops. Abdomen:  Normal bowel sounds.  No bruits.  Soft, non-tender and non-distended without masses, hepatosplenomegaly or hernias noted.  No guarding or rebound tenderness.  Negative Carnett sign.   Rectal:  Deferred.  Pulses:  Normal pulses noted. Extremities:  No clubbing or edema.  No cyanosis.  Neurologic:  Alert and oriented x3;  grossly normal neurologically. Skin:  Intact without significant lesions or rashes.  No jaundice. Lymph Nodes:  No significant cervical adenopathy. Psych:  Alert and cooperative. Normal mood and affect.  Imaging Studies: MM 3D SCREEN BREAST BILATERAL  Result Date: 03/27/2021 CLINICAL DATA:  Screening. EXAM: DIGITAL SCREENING BILATERAL MAMMOGRAM WITH TOMOSYNTHESIS AND CAD TECHNIQUE: Bilateral screening digital craniocaudal and mediolateral oblique mammograms were obtained. Bilateral screening digital breast tomosynthesis was performed. The images were evaluated with computer-aided detection. COMPARISON:  Previous exam(s). ACR Breast Density Category b: There are scattered areas of fibroglandular density. FINDINGS: There are no findings suspicious for malignancy. IMPRESSION: No mammographic evidence of malignancy. A result letter of this screening mammogram will be mailed directly to the patient. RECOMMENDATION: Screening mammogram in one year. (Code:SM-B-01Y) BI-RADS CATEGORY  1: Negative. Electronically Signed   By: Abelardo Diesel M.D.   On: 03/27/2021 08:58   Assessment and Plan:   LILLIAS DIFRANCESCO is a 60 y.o. y/o female who comes in today with a history of of colon polyps that were adenomatous with a repeat colonoscopy recommended this year.  The patient has had a change in bowel habits with what appears to be alternating diarrhea and constipation irritable bowel syndrome turning  to chronic constipation.  The patient will have her colon evaluated at the time of her surveillance colonoscopy for polyps.  The patient has been explained the plan and agrees with it.    [name], MD. Marval Regal    Note: This dictation was prepared with Dragon dictation along with smaller phrase technology. Any transcriptional errors that result from this process are unintentional.

## 2021-04-21 ENCOUNTER — Other Ambulatory Visit: Payer: Self-pay

## 2021-04-21 ENCOUNTER — Ambulatory Visit: Payer: Medicare Other | Admitting: Gastroenterology

## 2021-04-21 ENCOUNTER — Encounter: Payer: Self-pay | Admitting: Gastroenterology

## 2021-04-21 VITALS — BP 123/89 | HR 81 | Temp 99.2°F | Ht 67.0 in | Wt 200.8 lb

## 2021-04-21 DIAGNOSIS — R194 Change in bowel habit: Secondary | ICD-10-CM

## 2021-04-21 MED ORDER — SUTAB 1479-225-188 MG PO TABS: 1.0000 | ORAL_TABLET | ORAL | 0 refills | Status: DC

## 2021-04-23 ENCOUNTER — Other Ambulatory Visit: Payer: Self-pay

## 2021-04-23 ENCOUNTER — Telehealth (INDEPENDENT_AMBULATORY_CARE_PROVIDER_SITE_OTHER): Payer: Medicare Other | Admitting: Psychiatry

## 2021-04-23 DIAGNOSIS — F332 Major depressive disorder, recurrent severe without psychotic features: Secondary | ICD-10-CM

## 2021-04-23 MED ORDER — CLONAZEPAM 1 MG PO TABS: 1.0000 mg | ORAL_TABLET | Freq: Four times a day (QID) | ORAL | 1 refills | Status: DC | PRN

## 2021-04-23 MED ORDER — NORTRIPTYLINE HCL 50 MG PO CAPS: 50.0000 mg | ORAL_CAPSULE | Freq: Every day | ORAL | 1 refills | Status: DC

## 2021-04-23 MED ORDER — SERTRALINE HCL 100 MG PO TABS: 150.0000 mg | ORAL_TABLET | Freq: Every day | ORAL | 1 refills | Status: DC

## 2021-04-23 NOTE — Progress Notes (Signed)
Virtual Visit via Telephone Note  I connected with Denise Spencer on 04/23/21 at  1:00 PM EDT by telephone and verified that I am speaking with the correct person using two identifiers.  Location: Patient: Home Provider: Hospital   I discussed the limitations, risks, security and privacy concerns of performing an evaluation and management service by telephone and the availability of in person appointments. I also discussed with the patient that there may be a patient responsible charge related to this service. The patient expressed understanding and agreed to proceed.   History of Present Illness: Patient reached by telephone.  Identities established.  This is a 60 year old woman with chronic depression and anxiety.  She has had a great deal of stress.  Her husband has been diagnosed with multiple myeloma and is quite sick.  Patient has found that although the Zoloft has helped with her mood she remains irritable with a tendency to snap at people and lose her temper at times.  No violence.  No suicidal thought.  No psychosis.  Continues with other medications as prescribed.    Observations/Objective: Appropriate interaction.  Euthymic affect.  Thoughts lucid no evidence of loosening of associations or delusions.  No suicidal or homicidal ideation expressed.   Assessment and Plan: Reviewed medication.  She is already on high-dose clonazepam and the Pamelor.  I did suggest to her that if she is continuing to have anxiety and irritability we could try going up to 150 mg of Zoloft.  She was able to go up to 100 mg without any complications or side effects.  Reviewed plan.  New prescriptions have been sent in.  We will follow up in 3 months.  She is agreeable to this.   Follow Up Instructions:    I discussed the assessment and treatment plan with the patient. The patient was provided an opportunity to ask questions and all were answered. The patient agreed with the plan and demonstrated an  understanding of the instructions.   The patient was advised to call back or seek an in-person evaluation if the symptoms worsen or if the condition fails to improve as anticipated.  I provided 20 minutes of non-face-to-face time during this encounter.   Alethia Berthold, MD

## 2021-05-05 ENCOUNTER — Encounter: Payer: Self-pay | Admitting: Gastroenterology

## 2021-05-22 ENCOUNTER — Ambulatory Visit
Admission: RE | Admit: 2021-05-22 | Discharge: 2021-05-22 | Disposition: A | Discharge status: Home / Self Care | Payer: Medicare Other | Source: Ambulatory Visit | Attending: Gastroenterology | Admitting: Gastroenterology

## 2021-05-22 ENCOUNTER — Encounter: Payer: Self-pay | Admitting: Gastroenterology

## 2021-05-22 ENCOUNTER — Ambulatory Visit: Payer: Medicare Other | Admitting: Anesthesiology

## 2021-05-22 ENCOUNTER — Encounter
Admission: RE | Disposition: A | Discharge status: Home / Self Care | Payer: Self-pay | Source: Ambulatory Visit | Attending: Gastroenterology

## 2021-05-22 ENCOUNTER — Other Ambulatory Visit: Payer: Self-pay

## 2021-05-22 DIAGNOSIS — K635 Polyp of colon: Secondary | ICD-10-CM

## 2021-05-22 DIAGNOSIS — Z1211 Encounter for screening for malignant neoplasm of colon: Secondary | ICD-10-CM | POA: Insufficient documentation

## 2021-05-22 DIAGNOSIS — K648 Other hemorrhoids: Secondary | ICD-10-CM | POA: Insufficient documentation

## 2021-05-22 DIAGNOSIS — Z86010 Personal history of colonic polyps: Secondary | ICD-10-CM

## 2021-05-22 DIAGNOSIS — R194 Change in bowel habit: Secondary | ICD-10-CM

## 2021-05-22 DIAGNOSIS — D122 Benign neoplasm of ascending colon: Secondary | ICD-10-CM | POA: Diagnosis not present

## 2021-05-22 DIAGNOSIS — F17210 Nicotine dependence, cigarettes, uncomplicated: Secondary | ICD-10-CM | POA: Diagnosis not present

## 2021-05-22 HISTORY — PX: POLYPECTOMY: SHX5525

## 2021-05-22 HISTORY — PX: COLONOSCOPY WITH PROPOFOL: SHX5780

## 2021-05-22 SURGERY — COLONOSCOPY WITH PROPOFOL
Anesthesia: General | Site: Rectum

## 2021-05-22 MED ORDER — PROPOFOL 10 MG/ML IV BOLUS
INTRAVENOUS | Status: DC | PRN
  Administered 2021-05-22: 10 mg via INTRAVENOUS
  Administered 2021-05-22: 20 mg via INTRAVENOUS
  Administered 2021-05-22: 130 mg via INTRAVENOUS
  Administered 2021-05-22 (×4): 10 mg via INTRAVENOUS
  Administered 2021-05-22 (×2): 20 mg via INTRAVENOUS

## 2021-05-22 MED ORDER — STERILE WATER FOR IRRIGATION IR SOLN
Status: DC | PRN
  Administered 2021-05-22: 1

## 2021-05-22 MED ORDER — LIDOCAINE HCL (CARDIAC) PF 100 MG/5ML IV SOSY
PREFILLED_SYRINGE | INTRAVENOUS | Status: DC | PRN
  Administered 2021-05-22: 50 mg via INTRAVENOUS

## 2021-05-22 MED ORDER — LACTATED RINGERS IV SOLN: INTRAVENOUS | Status: DC

## 2021-05-22 MED ORDER — ACETAMINOPHEN 325 MG PO TABS: 325.0000 mg | ORAL_TABLET | ORAL | Status: DC | PRN

## 2021-05-22 MED ORDER — ACETAMINOPHEN 160 MG/5ML PO SOLN: 325.0000 mg | ORAL | Status: DC | PRN

## 2021-05-22 MED ORDER — ONDANSETRON HCL 4 MG/2ML IJ SOLN: 4.0000 mg | Freq: Once | INTRAMUSCULAR | Status: DC | PRN

## 2021-05-22 MED ORDER — SODIUM CHLORIDE 0.9 % IV SOLN: INTRAVENOUS | Status: DC

## 2021-05-22 SURGICAL SUPPLY — 8 items
GOWN CVR UNV OPN BCK APRN NK (MISCELLANEOUS) ×2 IMPLANT
GOWN ISOL THUMB LOOP REG UNIV (MISCELLANEOUS) ×4
KIT PRC NS LF DISP ENDO (KITS) ×1 IMPLANT
KIT PROCEDURE OLYMPUS (KITS) ×2
MANIFOLD NEPTUNE II (INSTRUMENTS) ×2 IMPLANT
SNARE COLD EXACTO (MISCELLANEOUS) ×2 IMPLANT
TRAP ETRAP POLY (MISCELLANEOUS) ×2 IMPLANT
WATER STERILE IRR 250ML POUR (IV SOLUTION) ×2 IMPLANT

## 2021-05-22 NOTE — Anesthesia Procedure Notes (Signed)
Date/Time: 05/22/2021 9:32 AM Performed by: Cameron Ali, CRNA Pre-anesthesia Checklist: Patient identified, Emergency Drugs available, Suction available, Timeout performed and Patient being monitored Patient Re-evaluated:Patient Re-evaluated prior to induction Oxygen Delivery Method: Nasal cannula Placement Confirmation: positive ETCO2

## 2021-05-22 NOTE — Transfer of Care (Signed)
Immediate Anesthesia Transfer of Care Note  Patient: Denise Spencer  Procedure(s) Performed: COLONOSCOPY WITH BIOPSY (Rectum) POLYPECTOMY (Rectum)  Patient Location: PACU  Anesthesia Type: General  Level of Consciousness: awake, alert  and patient cooperative  Airway and Oxygen Therapy: Patient Spontanous Breathing and Patient connected to supplemental oxygen  Post-op Assessment: Post-op Vital signs reviewed, Patient's Cardiovascular Status Stable, Respiratory Function Stable, Patent Airway and No signs of Nausea or vomiting  Post-op Vital Signs: Reviewed and stable  Complications: No notable events documented.

## 2021-05-22 NOTE — Anesthesia Postprocedure Evaluation (Signed)
Anesthesia Post Note  Patient: Denise Spencer  Procedure(s) Performed: COLONOSCOPY WITH BIOPSY (Rectum) POLYPECTOMY (Rectum)     Patient location during evaluation: PACU Anesthesia Type: General Level of consciousness: awake and alert Pain management: pain level controlled Vital Signs Assessment: post-procedure vital signs reviewed and stable Respiratory status: spontaneous breathing, nonlabored ventilation and respiratory function stable Cardiovascular status: blood pressure returned to baseline and stable Postop Assessment: no apparent nausea or vomiting Anesthetic complications: no   No notable events documented.  April Manson

## 2021-05-22 NOTE — Anesthesia Preprocedure Evaluation (Signed)
Anesthesia Evaluation  Patient identified by MRN, date of birth, ID band Patient awake    Reviewed: Allergy & Precautions, NPO status , Patient's Chart, lab work & pertinent test results  History of Anesthesia Complications (+) PONV  Airway Mallampati: II  TM Distance: >3 FB Neck ROM: Full    Dental no notable dental hx.    Pulmonary shortness of breath, sleep apnea , COPD, Current Smoker and Patient abstained from smoking., former smoker,    Pulmonary exam normal breath sounds clear to auscultation       Cardiovascular negative cardio ROS Normal cardiovascular exam Rhythm:Regular Rate:Normal     Neuro/Psych Anxiety Depression Bipolar Disorder negative neurological ROS  negative psych ROS   GI/Hepatic negative GI ROS, Neg liver ROS, GERD  Medicated and Controlled,  Endo/Other  negative endocrine ROS  Renal/GU negative Renal ROS  negative genitourinary   Musculoskeletal  (+) Arthritis , Fibromyalgia -  Abdominal   Peds negative pediatric ROS (+)  Hematology negative hematology ROS (+)   Anesthesia Other Findings   Reproductive/Obstetrics negative OB ROS                             Anesthesia Physical  Anesthesia Plan  ASA: 2  Anesthesia Plan: General   Post-op Pain Management:    Induction: Intravenous  PONV Risk Score and Plan: 3 and TIVA and Treatment may vary due to age or medical condition  Airway Management Planned:   Additional Equipment:   Intra-op Plan:   Post-operative Plan: Extubation in OR  Informed Consent: I have reviewed the patients History and Physical, chart, labs and discussed the procedure including the risks, benefits and alternatives for the proposed anesthesia with the patient or authorized representative who has indicated his/her understanding and acceptance.     Dental advisory given  Plan Discussed with: CRNA  Anesthesia Plan Comments:          Anesthesia Quick Evaluation

## 2021-05-22 NOTE — H&P (Signed)
[name], MD [address] [place], [place] [address] Phone:[phone] Fax : [phone]  Primary Care Physician:  [name], MD Primary Gastroenterologist:  [name]  Pre-Procedure History & Physical: HPI:  Denise Spencer is a 60 y.o. female is here for an colonoscopy.   Past Medical History:  Diagnosis Date   Anxiety    Arthritis    knees, lower back   Bipolar disorder (HCC)    Bulging lumbar disc    L5-S1   COPD (chronic obstructive pulmonary disease) (HCC)    MILD   Depression    Dyspnea    Fibromyalgia    GERD (gastroesophageal reflux disease)    PONV (postoperative nausea and vomiting)    with hysterectomy only   Sleep apnea    CPAP   Wears dentures    full upper and lower    Past Surgical History:  Procedure Laterality Date   ABDOMINAL HYSTERECTOMY     ABDOMINAL HYSTERECTOMY W/ PARTIAL VAGINACTOMY     CARDIAC CATHETERIZATION  2012   CESAREAN SECTION     COLONOSCOPY     COLONOSCOPY WITH PROPOFOL N/A 11/01/2016   Procedure: COLONOSCOPY WITH PROPOFOL;  Surgeon: [name], MD;  Location: [place];  Service: Endoscopy;  Laterality: N/A;   ESOPHAGOGASTRODUODENOSCOPY (EGD) WITH PROPOFOL N/A 11/01/2016   Procedure: ESOPHAGOGASTRODUODENOSCOPY (EGD) WITH PROPOFOL;  Surgeon: [name], MD;  Location: [facility];  Service: Endoscopy;  Laterality: N/A;   HIP ARTHROPLASTY Left 04/2020   POLYPECTOMY  11/01/2016   Procedure: POLYPECTOMY;  Surgeon: [name], MD;  Location: [facility];  Service: Endoscopy;;   SEPTOPLASTY Left 12/27/2016   Procedure: SEPTOPLASTY,ENDOSCOPIC TRIMMING MIDDLE TURBINATE;  Surgeon: Margaretha Sheffield, MD;  Location: [facility];  Service: ENT;  Laterality: Left;   WRIST GANGLION EXCISION      Prior to Admission medications   Medication Sig Start Date End Date Taking? Authorizing Provider  albuterol (PROVENTIL HFA;VENTOLIN HFA) 108 (90 Base) MCG/ACT inhaler INHALE 2 PUFFS BY MOUTH EVERY  6 HOURS ASNEEDED FOR WHEEZING OR SHORTNESS OF BREATH 06/16/18  Yes Wilhelmina Mcardle, MD  clonazePAM (KLONOPIN) 1 MG tablet Take 1 tablet (1 mg total) by mouth 4 (four) times daily as needed for anxiety. 04/23/21  Yes [name], [name], MD  gabapentin (NEURONTIN) 300 MG capsule Take 300 mg by mouth 2 (two) times daily. 300 MG IN AM AND 600 MG AT BEDTIME 11/30/14  Yes [provider]  nortriptyline (PAMELOR) 50 MG capsule Take 1 capsule (50 mg total) by mouth at bedtime. 04/23/21  Yes [name], [name], MD  pantoprazole (PROTONIX) 40 MG tablet Take 40 mg by mouth daily.   Yes [provider]  sertraline (ZOLOFT) 100 MG tablet Take 1.5 tablets (150 mg total) by mouth daily. 04/23/21 10/20/21 Yes [name], [name], MD  sertraline (ZOLOFT) 50 MG tablet Take 1 tablet (50 mg total) by mouth daily. 03/03/21  Yes [name], [name], MD  simvastatin (ZOCOR) 10 MG tablet Take 10 mg by mouth every evening.  01/17/14  Yes [provider]  Sodium Sulfate-Mag Sulfate-KCl (SUTAB) [phone] MG TABS Take 1 kit by mouth as directed. 04/21/21   [name], MD    Allergies as of 04/21/2021 - Review Complete 04/21/2021  Allergen Reaction Noted   Advair diskus [fluticasone-salmeterol]  09/16/2016   Amoxicillin  12/14/2016   Aspirin  12/20/2014   Breo ellipta [fluticasone furoate-vilanterol]  09/16/2016   Nsaids Nausea And Vomiting 12/20/2014   Tape  09/16/2016   Ciprofloxacin Rash 01/01/2015   Doxycycline Rash 12/20/2014   Erythromycin Rash 12/20/2014   Penicillins Rash 12/20/2014   Sulfa antibiotics Rash 01/01/2015    Family History  Problem Relation Age of Onset   Breast cancer Mother 59   Bipolar disorder Mother    Depression Mother    Anxiety disorder Mother    Heart attack Father    Thyroid cancer Father    Hypertension Father    Depression Sister    Multiple sclerosis Sister    Bipolar disorder Sister    Hypertension Sister    Bipolar disorder Sister    Lymphoma Sister     Anxiety disorder Sister    Bipolar disorder Sister    Multiple sclerosis Other     Social History   Socioeconomic History   Marital status: Married    Spouse name: Not on file   Number of children: Not on file   Years of education: Not on file   Highest education level: Not on file  Occupational History   Not on file  Tobacco Use   Smoking status: Every Day    Packs/day: 0.50    Years: 40.00    Pack years: 20.00    Types: Cigarettes    Start date: 05/04/1977    Last attempt to quit: 06/25/2016    Years since quitting: 4.9   Smokeless tobacco: Never   Tobacco comments:     Quit in 2018 but has since restarted after losing her daughter 04/2020  Vaping Use   Vaping Use: Never used  Substance and Sexual Activity   Alcohol use: No    Alcohol/week: 0.0 standard drinks   Drug use: No   Sexual activity: Yes  Other Topics Concern   Not on file  Social History Narrative   Lives at home with husband and adult son.  Pt is disabled.  Has 2 children.  Education: 12 th grade.    Social Determinants of Health   Financial Resource Strain: Not on file  Food Insecurity: Not on file  Transportation Needs: Not on file  Physical Activity: Not on file  Stress: Not on file  Social Connections: Not on file  Intimate Partner Violence: Not on file    Review of Systems: See HPI, otherwise negative ROS  Physical Exam: BP 125/78   Pulse 80   Temp 97.7 F (36.5 C) (Temporal)   Ht _0  (1.702 m)   Wt 89.4 kg   SpO2 100%   BMI 30.89 kg/m  General:   Alert,  pleasant and cooperative in NAD Head:  Normocephalic and atraumatic. Neck:  Supple; no masses or thyromegaly. Lungs:  Clear throughout to auscultation.    Heart:  Regular rate and rhythm. Abdomen:  Soft, nontender and nondistended. Normal bowel sounds, without guarding, and without rebound.   Neurologic:  Alert and  oriented x4;  grossly normal neurologically.  Impression/Plan: Denise Spencer is here for an colonoscopy to  be performed for a history of adenomatous polyps on 2018   Risks, benefits, limitations, and alternatives regarding  colonoscopy have been reviewed with the patient.  Questions have been answered.  All parties agreeable.   [name], MD  05/22/2021, 8:53 AM

## 2021-05-22 NOTE — Op Note (Signed)
[facility] Gastroenterology Patient Name: Denise Spencer Procedure Date: 05/22/2021 9:26 AM MRN: [id number] Account #: [id number] Date of Birth: Apr 10, 1961 Admit Type: Outpatient Age: 60 Room: [facility] OR ROOM 01 Gender: Female Note Status: Finalized Instrument Name: [id number] Procedure:             Colonoscopy Indications:           High risk colon cancer surveillance: Personal history                         of colonic polyps Providers:             [name] MD, MD Referring MD:          Caprice Renshaw MD (Referring MD) Medicines:             Propofol per Anesthesia Complications:         No immediate complications. Procedure:             Pre-Anesthesia Assessment:                        - Prior to the procedure, a History and Physical was                         performed, and patient medications and allergies were                         reviewed. The patient's tolerance of previous                         anesthesia was also reviewed. The risks and benefits                         of the procedure and the sedation options and risks                         were discussed with the patient. All questions were                         answered, and informed consent was obtained. Prior                         Anticoagulants: The patient has taken no previous                         anticoagulant or antiplatelet agents. ASA Grade                         Assessment: II - A patient with mild systemic disease.                         After reviewing the risks and benefits, the patient                         was deemed in satisfactory condition to undergo the                         procedure.  After obtaining informed consent, the colonoscope was                         passed under direct vision. Throughout the procedure,                         the patient's blood pressure, pulse, and oxygen                         saturations were monitored  continuously. The                         Colonoscope was introduced through the anus and                         advanced to the the cecum, identified by appendiceal                         orifice and ileocecal valve. The colonoscopy was                         performed without difficulty. The patient tolerated                         the procedure well. The quality of the bowel                         preparation was excellent. Findings:      The perianal and digital rectal examinations were normal.      Two sessile polyps were found in the ascending colon. The polyps were 3       to 4 mm in size. These polyps were removed with a cold snare. Resection       and retrieval were complete.      Non-bleeding internal hemorrhoids were found during retroflexion. The       hemorrhoids were Grade I (internal hemorrhoids that do not prolapse). Impression:            - Two 3 to 4 mm polyps in the ascending colon, removed                         with a cold snare. Resected and retrieved.                        - Non-bleeding internal hemorrhoids. Recommendation:        - Discharge patient to home.                        - Resume previous diet.                        - Continue present medications.                        - Await pathology results.                        - Repeat colonoscopy in 7 years for surveillance. Procedure Code(s):     --- Professional ---  45385, Colonoscopy, flexible; with removal of                         tumor(s), polyp(s), or other lesion(s) by snare                         technique Diagnosis Code(s):     --- Professional ---                        Z86.010, Personal history of colonic polyps                        K63.5, Polyp of colon CPT copyright 2019 American Medical Association. All rights reserved. The codes documented in this report are preliminary and upon coder review may  be revised to meet current compliance requirements. [name] MD, MD 05/22/2021 9:49:52 AM This report has been signed electronically. Number of Addenda: 0 Note Initiated On: 05/22/2021 9:26 AM Scope Withdrawal Time: 0 hours 9 minutes 2 seconds  Total Procedure Duration: 0 hours 10 minutes 32 seconds  Estimated Blood Loss:  Estimated blood loss: none.      [facility]

## 2021-05-25 ENCOUNTER — Encounter: Payer: Self-pay | Admitting: Gastroenterology

## 2021-05-26 LAB — SURGICAL PATHOLOGY

## 2021-05-27 ENCOUNTER — Encounter: Payer: Self-pay | Admitting: Gastroenterology

## 2021-07-16 ENCOUNTER — Encounter (INDEPENDENT_AMBULATORY_CARE_PROVIDER_SITE_OTHER): Payer: Self-pay

## 2021-08-17 ENCOUNTER — Ambulatory Visit (INDEPENDENT_AMBULATORY_CARE_PROVIDER_SITE_OTHER): Payer: Medicare Other | Admitting: Podiatry

## 2021-08-17 ENCOUNTER — Other Ambulatory Visit: Payer: Self-pay

## 2021-08-17 ENCOUNTER — Ambulatory Visit (INDEPENDENT_AMBULATORY_CARE_PROVIDER_SITE_OTHER): Payer: Medicare Other

## 2021-08-17 ENCOUNTER — Encounter: Payer: Self-pay | Admitting: Podiatry

## 2021-08-17 DIAGNOSIS — M2011 Hallux valgus (acquired), right foot: Secondary | ICD-10-CM

## 2021-08-17 DIAGNOSIS — M778 Other enthesopathies, not elsewhere classified: Secondary | ICD-10-CM

## 2021-08-17 NOTE — Progress Notes (Signed)
Subjective:  Patient ID: Denise Spencer, female    DOB: 07-04-1960,  MRN: [id number] HPI Chief Complaint  Patient presents with   Foot Pain    1st MPJ left - bunion deformity, 1st toe overlaps 2nd toe, uncomfortable, also previous injury to lateral foot-5th met fracture and sprain ankle-sometimes still sore   Foot Pain    5th toe left - injury to toe x 2 months ago, still swells a lot   New Patient (Initial Visit)    61 y.o. female presents with the above complaint.   ROS: Denies fever chills nausea vomiting muscle aches pains calf pain back pain chest pain shortness of breath.  Past Medical History:  Diagnosis Date   Anxiety    Arthritis    knees, lower back   Bipolar disorder (HCC)    Bulging lumbar disc    L5-S1   COPD (chronic obstructive pulmonary disease) (HCC)    MILD   Depression    Dyspnea    Fibromyalgia    GERD (gastroesophageal reflux disease)    PONV (postoperative nausea and vomiting)    with hysterectomy only   Sleep apnea    CPAP   Wears dentures    full upper and lower   Past Surgical History:  Procedure Laterality Date   ABDOMINAL HYSTERECTOMY     ABDOMINAL HYSTERECTOMY W/ PARTIAL VAGINACTOMY     CARDIAC CATHETERIZATION  2012   CESAREAN SECTION     COLONOSCOPY     COLONOSCOPY WITH PROPOFOL N/A 11/01/2016   Procedure: COLONOSCOPY WITH PROPOFOL;  Surgeon: [name], MD;  Location: [place];  Service: Endoscopy;  Laterality: N/A;   COLONOSCOPY WITH PROPOFOL N/A 05/22/2021   Procedure: COLONOSCOPY WITH BIOPSY;  Surgeon: [name], MD;  Location: [place];  Service: Endoscopy;  Laterality: N/A;   ESOPHAGOGASTRODUODENOSCOPY (EGD) WITH PROPOFOL N/A 11/01/2016   Procedure: ESOPHAGOGASTRODUODENOSCOPY (EGD) WITH PROPOFOL;  Surgeon: [name], MD;  Location: [facility];  Service: Endoscopy;  Laterality: N/A;   HIP ARTHROPLASTY Left 04/2020   POLYPECTOMY  11/01/2016   Procedure: POLYPECTOMY;  Surgeon: [name],  MD;  Location: [place];  Service: Endoscopy;;   POLYPECTOMY N/A 05/22/2021   Procedure: POLYPECTOMY;  Surgeon: [name], MD;  Location: [place];  Service: Endoscopy;  Laterality: N/A;   SEPTOPLASTY Left 12/27/2016   Procedure: [place];  Surgeon: Margaretha Sheffield, MD;  Location: [facility];  Service: ENT;  Laterality: Left;   WRIST GANGLION EXCISION      Current Outpatient Medications:    albuterol (PROVENTIL HFA;VENTOLIN HFA) 108 (90 Base) MCG/ACT inhaler, INHALE 2 PUFFS BY MOUTH EVERY 6 HOURS ASNEEDED FOR WHEEZING OR SHORTNESS OF BREATH, Disp: 9 g, Rfl: 0   clonazePAM (KLONOPIN) 1 MG tablet, Take 1 tablet (1 mg total) by mouth 4 (four) times daily as needed for anxiety., Disp: 360 tablet, Rfl: 1   cyclobenzaprine (FLEXERIL) 10 MG tablet, Take 10 mg by mouth 3 (three) times daily as needed., Disp: , Rfl:    diclofenac Sodium (VOLTAREN) 1 % GEL, Apply 2 g topically 4 (four) times daily., Disp: , Rfl:    gabapentin (NEURONTIN) 300 MG capsule, Take 300 mg by mouth 2 (two) times daily. 300 MG IN AM AND 600 MG AT BEDTIME, Disp: , Rfl:    HYDROcodone-acetaminophen (NORCO) 10-325 MG tablet, Take 1 tablet by mouth., Disp: , Rfl:    ketorolac (TORADOL) 60 MG/2ML SOLN injection, Inject 60 mg into the muscle once., Disp: ,  Rfl:    methocarbamol (ROBAXIN) 500 MG tablet, Take 1,000 mg by mouth 4 (four) times daily., Disp: , Rfl:    nortriptyline (PAMELOR) 50 MG capsule, Take 1 capsule (50 mg total) by mouth at bedtime., Disp: 90 capsule, Rfl: 1   pantoprazole (PROTONIX) 40 MG tablet, Take 40 mg by mouth daily., Disp: , Rfl:    sertraline (ZOLOFT) 100 MG tablet, Take 1.5 tablets (150 mg total) by mouth daily., Disp: 135 tablet, Rfl: 1   sertraline (ZOLOFT) 50 MG tablet, Take 1 tablet (50 mg total) by mouth daily., Disp: 30 tablet, Rfl: 0   simvastatin (ZOCOR) 10 MG tablet, Take 10 mg by mouth every evening. , Disp: , Rfl:    Sodium Sulfate-Mag  Sulfate-KCl (SUTAB) 620 820 9716 MG TABS, Take 1 kit by mouth as directed., Disp: 24 tablet, Rfl: 0   tiZANidine (ZANAFLEX) 4 MG tablet, Take 4 mg by mouth 3 (three) times daily as needed., Disp: , Rfl:   Allergies  Allergen Reactions   Advair Diskus [Fluticasone-Salmeterol]     Caused thrush   Amoxicillin    Aspirin     Muscle and joint pain   Breo Ellipta [Fluticasone Furoate-Vilanterol]     Caused thrush   Nsaids Nausea And Vomiting   Tape     Some adhesives cause blisters.  Tegaderm is OK.   Ciprofloxacin Rash     Has taken now with no problem.  04-27-17   Doxycycline Rash   Erythromycin Rash   Penicillins Rash   Sulfa Antibiotics Rash        Review of Systems Objective:  There were no vitals filed for this visit.  General: Well developed, nourished, in no acute distress, alert and oriented x3   Dermatological: Skin is warm, dry and supple bilateral. Nails x 10 are well maintained; remaining integument appears unremarkable at this time. There are no open sores, no preulcerative lesions, no rash or signs of infection present.  Vascular: Dorsalis Pedis artery and Posterior Tibial artery pedal pulses are 2/4 bilateral with immedate capillary fill time. Pedal hair growth present. No varicosities and no lower extremity edema present bilateral.   Neruologic: Grossly intact via light touch bilateral. Vibratory intact via tuning fork bilateral. Protective threshold with Semmes Wienstein monofilament intact to all pedal sites bilateral. Patellar and Achilles deep tendon reflexes 2+ bilateral. No Babinski or clonus noted bilateral.   Musculoskeletal: No gross boney pedal deformities bilateral. No pain, crepitus, or limitation noted with foot and ankle range of motion bilateral. Muscular strength 5/5 in all groups tested bilateral.  Increase in the first intermetatarsal angle greater than normal value with minimally reducible first metatarsal phalangeal joint.  Very prominent first  metatarsal head right foot.  Mild pes planus.  Contralateral foot fifth left digit mild edema mildly tender.  Gait: Unassisted, Nonantalgic.    Radiographs:  Radiographs taken today demonstrate an osseously mature individual.  Significant increase in the first intermetatarsal angle with hallux abductus and the tingling against the second toe.  There does not appear to be any acute trauma.  Fifth digit left foot does demonstrate a healing fracture minimal displacement.  Assessment & Plan:   Assessment: Hallux abductovalgus deformity of the right foot fractured fifth digit left foot healing.  Plan: Discussed etiology pathology conservative versus surgical therapies at this point she would like to hold off on surgery until her husband's diagnosis of Paget's disease is confirmed and treatment plan has been discussed.  We did discuss some the need for surgery  today she understands this and is amenable to it.     Kamree Wiens T. [place], [place]

## 2021-09-14 ENCOUNTER — Ambulatory Visit (INDEPENDENT_AMBULATORY_CARE_PROVIDER_SITE_OTHER): Payer: Medicare Other | Admitting: Podiatry

## 2021-09-14 ENCOUNTER — Other Ambulatory Visit: Payer: Self-pay

## 2021-09-14 ENCOUNTER — Encounter: Payer: Self-pay | Admitting: Podiatry

## 2021-09-14 DIAGNOSIS — M2011 Hallux valgus (acquired), right foot: Secondary | ICD-10-CM | POA: Diagnosis not present

## 2021-09-14 NOTE — Progress Notes (Signed)
She presents today to discuss surgery she would like to discuss this once again. ? ?Objective: Vital signs are stable she is alert and oriented x3 pulses are palpable.  Review of radiographs do demonstrate an increase in the first intermetatarsal angle greater than normal value it appears to be 14.3 to degrees most consistent with an Lapidus repair.  She has hallux valgus with some dorsiflexion. ? ?Assessment: Hallux abductovalgus deformity right foot. ? ?Plan: Discussed etiology pathology and surgical therapies discussed Lapidus procedure with her and the necessity for being off of her foot during this time.  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  She would like to follow-up with me in a couple of months to discuss further options for surgery. ?

## 2021-09-15 ENCOUNTER — Telehealth (INDEPENDENT_AMBULATORY_CARE_PROVIDER_SITE_OTHER): Payer: Medicare Other | Admitting: Psychiatry

## 2021-09-15 DIAGNOSIS — F332 Major depressive disorder, recurrent severe without psychotic features: Secondary | ICD-10-CM | POA: Diagnosis not present

## 2021-09-15 MED ORDER — NORTRIPTYLINE HCL 50 MG PO CAPS: 50.0000 mg | ORAL_CAPSULE | Freq: Every day | ORAL | 1 refills | Status: DC

## 2021-09-15 MED ORDER — CLONAZEPAM 1 MG PO TABS: 1.0000 mg | ORAL_TABLET | Freq: Four times a day (QID) | ORAL | 1 refills | Status: DC | PRN

## 2021-09-15 MED ORDER — SERTRALINE HCL 100 MG PO TABS: 150.0000 mg | ORAL_TABLET | Freq: Every day | ORAL | 1 refills | Status: DC

## 2021-09-15 NOTE — Progress Notes (Signed)
Virtual Visit via Telephone Note ? ?I connected with ARGELIA FORMISANO on 09/15/21 at  2:20 PM EDT by telephone and verified that I am speaking with the correct person using two identifiers. ? ?Location: ?Patient: Home ?Provider: Hospital ?  ?I discussed the limitations, risks, security and privacy concerns of performing an evaluation and management service by telephone and the availability of in person appointments. I also discussed with the patient that there may be a patient responsible charge related to this service. The patient expressed understanding and agreed to proceed. ? ? ?History of Present Illness: Patient reached by telephone.  Identities established.  Patient reports her mood has been better since making the change in the medicine.  She continues to have a great deal of stress in her life.  Husband's health continues to be a paramount problem.  She is sleeping well and eating well.  Continues to have her own health problems.  Foot pain continues to be an issue.  Tolerating medicine. ? ?  ?Observations/Objective: Alert and oriented upbeat affect thoughts lucid no loosening of associations.  No report of suicidal or homicidal ideation or disorganized thinking ? ? ?Assessment and Plan: Review medicines.  Continue all medications with increase of Zoloft to 150 mg follow-up 6 months ? ? ?Follow Up Instructions: ? ?  ?I discussed the assessment and treatment plan with the patient. The patient was provided an opportunity to ask questions and all were answered. The patient agreed with the plan and demonstrated an understanding of the instructions. ?  ?The patient was advised to call back or seek an in-person evaluation if the symptoms worsen or if the condition fails to improve as anticipated. ? ?I provided 20 minutes of non-face-to-face time during this encounter. ? ? ?Alethia Berthold, MD  ?

## 2021-11-10 ENCOUNTER — Other Ambulatory Visit: Payer: Self-pay | Admitting: Psychiatry

## 2021-11-10 MED ORDER — SERTRALINE HCL 50 MG PO TABS
50.0000 mg | ORAL_TABLET | Freq: Every day | ORAL | 12 refills | Status: DC
Start: 2021-11-10 — End: 2021-12-07

## 2021-11-23 ENCOUNTER — Encounter: Payer: Self-pay | Admitting: Podiatry

## 2021-11-23 ENCOUNTER — Ambulatory Visit (INDEPENDENT_AMBULATORY_CARE_PROVIDER_SITE_OTHER): Payer: Medicare Other | Admitting: Podiatry

## 2021-11-23 DIAGNOSIS — M2011 Hallux valgus (acquired), right foot: Secondary | ICD-10-CM

## 2021-11-23 NOTE — Progress Notes (Signed)
She presents today once again to discuss some surgical intervention regarding her right foot.  She states that she would like to have this done as soon as possible and remembers the majority of our discussion regarding this.  Objective: Vital signs are stable she is alert and oriented x3 I have reviewed her past medical history medications allergies surgeries and social history are reviewed her communications and charted information regarding this hallux valgus deformity.  I also reviewed her radiographs this morning.  I also reviewed her chart and information as far as labs and current medications.  Assessment moderate to severe hallux abductovalgus deformity all conservative therapies have failed right.  Plan: I am consented her today for a Lapidus procedure with screws and staples and plates she will be casted postoperatively for period of 6 to 8 weeks she will be nonweightbearing she understands this and is amenable to it we did discuss the possible side effects and complications that are associated with procedure such as these which may include but are not limited to postop pain bleeding swell infection recurrence need for further surgery overcorrection under correction also digit loss limb loss of life.  Follow-up with her in the near future for surgical intervention.

## 2021-11-24 ENCOUNTER — Telehealth: Payer: Self-pay | Admitting: Urology

## 2021-11-24 NOTE — Telephone Encounter (Signed)
DOS - 12/04/21    LAPIDUS PROCEDURE INCLUDING BUNIONECTOMY RIGHT --- 72820  [employer] EFFECTIVE DATE - 06/21/21  PLAN DEDUCTIBLE - $0.00 OUT OF POCKET - $8,300.00 W/ $8,144.09 REMAINING COINSURANCE - 20% COPAY - $0.00  PER [employer] WEBSITE FOR CPT CODE 60156 Notification or Prior Authorization is not required for the requested services  Decision ID #:[id number]

## 2021-12-03 ENCOUNTER — Other Ambulatory Visit: Payer: Self-pay | Admitting: Podiatry

## 2021-12-03 MED ORDER — OXYCODONE-ACETAMINOPHEN 10-325 MG PO TABS: 1.0000 | ORAL_TABLET | Freq: Three times a day (TID) | ORAL | 0 refills | Status: DC | PRN

## 2021-12-03 MED ORDER — CLINDAMYCIN HCL 150 MG PO CAPS: 150.0000 mg | ORAL_CAPSULE | Freq: Three times a day (TID) | ORAL | 0 refills | Status: DC

## 2021-12-03 MED ORDER — ONDANSETRON HCL 4 MG PO TABS: 4.0000 mg | ORAL_TABLET | Freq: Three times a day (TID) | ORAL | 0 refills | Status: DC | PRN

## 2021-12-04 DIAGNOSIS — M2011 Hallux valgus (acquired), right foot: Secondary | ICD-10-CM | POA: Diagnosis not present

## 2021-12-06 ENCOUNTER — Inpatient Hospital Stay
Admission: EM | Admit: 2021-12-06 | Discharge: 2021-12-07 | DRG: 917 | Discharge status: Against Medical Advice | Payer: Medicare Other | Attending: Internal Medicine | Admitting: Internal Medicine

## 2021-12-06 ENCOUNTER — Other Ambulatory Visit: Payer: Self-pay

## 2021-12-06 ENCOUNTER — Emergency Department: Payer: Medicare Other

## 2021-12-06 ENCOUNTER — Inpatient Hospital Stay: Payer: Medicare Other

## 2021-12-06 DIAGNOSIS — N179 Acute kidney failure, unspecified: Secondary | ICD-10-CM | POA: Diagnosis present

## 2021-12-06 DIAGNOSIS — J690 Pneumonitis due to inhalation of food and vomit: Secondary | ICD-10-CM | POA: Diagnosis present

## 2021-12-06 DIAGNOSIS — I248 Other forms of acute ischemic heart disease: Secondary | ICD-10-CM | POA: Diagnosis present

## 2021-12-06 DIAGNOSIS — Z818 Family history of other mental and behavioral disorders: Secondary | ICD-10-CM

## 2021-12-06 DIAGNOSIS — J9601 Acute respiratory failure with hypoxia: Secondary | ICD-10-CM

## 2021-12-06 DIAGNOSIS — F17210 Nicotine dependence, cigarettes, uncomplicated: Secondary | ICD-10-CM | POA: Diagnosis present

## 2021-12-06 DIAGNOSIS — Z888 Allergy status to other drugs, medicaments and biological substances status: Secondary | ICD-10-CM | POA: Diagnosis not present

## 2021-12-06 DIAGNOSIS — G928 Other toxic encephalopathy: Secondary | ICD-10-CM | POA: Diagnosis present

## 2021-12-06 DIAGNOSIS — J9811 Atelectasis: Secondary | ICD-10-CM | POA: Diagnosis present

## 2021-12-06 DIAGNOSIS — Z20822 Contact with and (suspected) exposure to covid-19: Secondary | ICD-10-CM | POA: Diagnosis present

## 2021-12-06 DIAGNOSIS — J449 Chronic obstructive pulmonary disease, unspecified: Secondary | ICD-10-CM | POA: Diagnosis present

## 2021-12-06 DIAGNOSIS — Z886 Allergy status to analgesic agent status: Secondary | ICD-10-CM

## 2021-12-06 DIAGNOSIS — Z880 Allergy status to penicillin: Secondary | ICD-10-CM | POA: Diagnosis not present

## 2021-12-06 DIAGNOSIS — M797 Fibromyalgia: Secondary | ICD-10-CM | POA: Diagnosis present

## 2021-12-06 DIAGNOSIS — G4733 Obstructive sleep apnea (adult) (pediatric): Secondary | ICD-10-CM | POA: Diagnosis present

## 2021-12-06 DIAGNOSIS — Z91048 Other nonmedicinal substance allergy status: Secondary | ICD-10-CM | POA: Diagnosis not present

## 2021-12-06 DIAGNOSIS — T391X1A Poisoning by 4-Aminophenol derivatives, accidental (unintentional), initial encounter: Principal | ICD-10-CM | POA: Diagnosis present

## 2021-12-06 DIAGNOSIS — R0902 Hypoxemia: Secondary | ICD-10-CM

## 2021-12-06 DIAGNOSIS — T50901A Poisoning by unspecified drugs, medicaments and biological substances, accidental (unintentional), initial encounter: Secondary | ICD-10-CM | POA: Diagnosis present

## 2021-12-06 DIAGNOSIS — M5136 Other intervertebral disc degeneration, lumbar region: Secondary | ICD-10-CM | POA: Diagnosis present

## 2021-12-06 DIAGNOSIS — T391X5A Adverse effect of 4-Aminophenol derivatives, initial encounter: Secondary | ICD-10-CM | POA: Diagnosis present

## 2021-12-06 DIAGNOSIS — F319 Bipolar disorder, unspecified: Secondary | ICD-10-CM | POA: Diagnosis present

## 2021-12-06 DIAGNOSIS — Z882 Allergy status to sulfonamides status: Secondary | ICD-10-CM

## 2021-12-06 DIAGNOSIS — F419 Anxiety disorder, unspecified: Secondary | ICD-10-CM | POA: Diagnosis present

## 2021-12-06 DIAGNOSIS — Z8249 Family history of ischemic heart disease and other diseases of the circulatory system: Secondary | ICD-10-CM | POA: Diagnosis not present

## 2021-12-06 DIAGNOSIS — Z79899 Other long term (current) drug therapy: Secondary | ICD-10-CM | POA: Diagnosis not present

## 2021-12-06 LAB — COMPREHENSIVE METABOLIC PANEL
ALT: 13 U/L (ref 0–44)
AST: 24 U/L (ref 15–41)
Albumin: 3.6 g/dL (ref 3.5–5.0)
Alkaline Phosphatase: 74 U/L (ref 38–126)
Anion gap: 8 (ref 5–15)
BUN: 13 mg/dL (ref 6–20)
CO2: 28 mmol/L (ref 22–32)
Calcium: 8.9 mg/dL (ref 8.9–10.3)
Chloride: 101 mmol/L (ref 98–111)
Creatinine, Ser: 1.3 mg/dL — ABNORMAL HIGH (ref 0.44–1.00)
GFR, Estimated: 48 mL/min — ABNORMAL LOW (ref >60)
Glucose, Bld: 145 mg/dL — ABNORMAL HIGH (ref 70–99)
Potassium: 4.2 mmol/L (ref 3.5–5.1)
Sodium: 137 mmol/L (ref 135–145)
Total Bilirubin: 0.5 mg/dL (ref 0.3–1.2)
Total Protein: 7.6 g/dL (ref 6.5–8.1)

## 2021-12-06 LAB — CBC
HCT: 42.5 % (ref 36.0–46.0)
Hemoglobin: 13.1 g/dL (ref 12.0–15.0)
MCH: 28.3 pg (ref 26.0–34.0)
MCHC: 30.8 g/dL (ref 30.0–36.0)
MCV: 91.8 fL (ref 80.0–100.0)
Platelets: 224 10*3/uL (ref 150–400)
RBC: 4.63 MIL/uL (ref 3.87–5.11)
RDW: 14.2 % (ref 11.5–15.5)
WBC: 18.9 10*3/uL — ABNORMAL HIGH (ref 4.0–10.5)
nRBC: 0 % (ref 0.0–0.2)

## 2021-12-06 LAB — MAGNESIUM: Magnesium: 2.6 mg/dL — ABNORMAL HIGH (ref 1.7–2.4)

## 2021-12-06 LAB — PROCALCITONIN: Procalcitonin: 6.32 ng/mL

## 2021-12-06 LAB — TROPONIN I (HIGH SENSITIVITY): Troponin I (High Sensitivity): 42 ng/L — ABNORMAL HIGH (ref <18)

## 2021-12-06 LAB — PHOSPHORUS: Phosphorus: 5 mg/dL — ABNORMAL HIGH (ref 2.5–4.6)

## 2021-12-06 MED ORDER — POLYETHYLENE GLYCOL 3350 17 G PO PACK: 17.0000 g | PACK | Freq: Every day | ORAL | Status: DC | PRN

## 2021-12-06 MED ORDER — ENOXAPARIN SODIUM 60 MG/0.6ML IJ SOSY
0.5000 mg/kg | PREFILLED_SYRINGE | INTRAMUSCULAR | Status: DC
  Administered 2021-12-07: 52.5 mg via SUBCUTANEOUS
  Filled 2021-12-06: qty 0.53

## 2021-12-06 MED ORDER — DOCUSATE SODIUM 100 MG PO CAPS: 100.0000 mg | ORAL_CAPSULE | Freq: Two times a day (BID) | ORAL | Status: DC | PRN

## 2021-12-06 MED ORDER — PANTOPRAZOLE SODIUM 40 MG IV SOLR
40.0000 mg | Freq: Every day | INTRAVENOUS | Status: DC
Start: 2021-12-06 — End: 2021-12-07

## 2021-12-06 MED ORDER — NALOXONE HCL 2 MG/2ML IJ SOSY
PREFILLED_SYRINGE | INTRAMUSCULAR | Status: AC
  Filled 2021-12-06: qty 6

## 2021-12-06 MED ORDER — LEVOFLOXACIN IN D5W 750 MG/150ML IV SOLN
750.0000 mg | Freq: Once | INTRAVENOUS | Status: AC
  Administered 2021-12-06: 750 mg via INTRAVENOUS
  Filled 2021-12-06: qty 150

## 2021-12-06 NOTE — Progress Notes (Signed)
CODE SEPSIS - PHARMACY COMMUNICATION  **Broad Spectrum Antibiotics should be administered within 1 hour of Sepsis diagnosis**  Time Code Sepsis Called/Page Received: 2256  Antibiotics Ordered: Levaquin  Time of 1st antibiotic administration: [place], PharmD, [facility] 12/06/2021 10:55 PM

## 2021-12-06 NOTE — H&P (Incomplete)
NAME:  Denise Spencer, MRN:  [id number], DOB:  1961-04-13, LOS: 0 ADMISSION DATE:  12/06/2021, CONSULTATION DATE:  12/06/21 REFERRING MD:  Dr. Kerman Passey, CHIEF COMPLAINT:  Drug Overdose   History of Present Illness:  61 yo F presenting to [facility] ED from home via EMS on 12/06/21 with concern for possible drug overdose. Per the patient and family report bedside, the patient had right ankle surgery on 12/04/21 and received a prescription for Percocet 10 mg- 325 mg Q 8 hours. The patient reports she was having increased pain and the Dr. Rockey Situ her she could take the Percocet every 4-6 hours PRN. Patient was in her normal state of health until today (12/06/21) when her son gave her a dose of Percocet around noon, which the patient does not remember. Then she reports waking up and taking her Percocet and gabapentin possibly around 5 pm on 6/18. Her son came back to give her an 8 pm dose and found her unresponsive, but breathing and with a pulse. EMS was called at 9:30 pm. Per ED documentation, EMS found ED course: ***. Medications given: Levaquin Initial Vitals: 97.3, 18, 104, 95/69 & SpO2 91% on NRB Significant labs: (Labs/ Imaging personally reviewed) I, Domingo Pulse Rust-Chester, AGACNP-BC, personally viewed and interpreted this ECG. EKG Interpretation: Date: ***, EKG Time: ***, Rate: ***, Rhythm: ***, QRS Axis:  *** Intervals: ***, ST/T Wave abnormalities: ***, Narrative Interpretation: *** Chemistry: Na+:137, K+: 4.2, BUN/Cr.: 13/1.30, Serum CO2/ AG: 28/8 Hematology: WBC: 18.9, Hgb: 13.1,  Troponin: 42, Lactic/ PCT: ***, COVID-19 & Influenza A/B: *** ABG: *** CXR ***: *** CT ***: ***  PCCM consulted for admission due to ***.  Pertinent  Medical History  Bipolar disorder & anxiety & depression COPD Fibromyalgia Bulging lumbar disc (L5-S1) OSA on CPAP***  Significant Hospital Events: Including procedures, antibiotic start and stop dates in addition to other pertinent events   ***  Interim  History / Subjective:  ***  Objective   Blood pressure 95/69, pulse (!) 104, temperature (!) 97.3 F (36.3 C), temperature source Oral, resp. rate 17, height '5\' 7"'$  (1.702 m), weight 106 kg, SpO2 91 %.        Intake/Output Summary (Last 24 hours) at 12/06/2021 2300 Last data filed at 12/06/2021 2151 Gross per 24 hour  Intake 200 ml  Output --  Net 200 ml   Filed Weights   12/06/21 2202  Weight: 106 kg    Examination: General: Adult ***, critically***acutely ill, lying in bed intubated & sedated requiring mechanical ventilation *** NAD HEENT: MM pink/moist, anicteric***, atraumatic, neck supple Neuro: A&O x *** commands, PERRL *** , MAE CV: s1s2 ***RRR, *** on monitor, no r/m/g Pulm: Regular, non labored on *** , breath sounds ***-BUL & ***-BLL GI: soft, ***, non***tender, bs x 4 GU: foley in place *** with clear yellow urine Skin: *** no rashes/lesions noted Extremities: warm/dry, pulses + 2 R/P, *** edema noted  Resolved Hospital Problem list     Assessment & Plan:  Acute Hypoxic/ *** Hypercapnic Respiratory Failure secondary to *** PMHx: *** - Continue BIPAP/***HHFNC overnight, wean FiO2 as tolerated - Supplemental O2 to maintain SpO2 > 90%***88% - Intermittent chest x-ray & ABG PRN*** - Ensure adequate pulmonary hygiene  - F/u cultures, trend PCT - Continue CAP/***Aspiration Pna coverage: cefepime/***unasyn - budesonide inhaler/***nebs BID, bronchodilators PRN  Acute Kidney Injury secondary to/***in the setting of *** Baseline Cr: ***, Cr on admission:*** - Strict I/O's: alert provider if UOP < 0.5 mL/kg/hr - gentle IVF  hydration *** - Daily BMP, replace electrolytes PRN - Avoid nephrotoxic agents as able, ensure adequate renal perfusion - Nephrology following, appreciate input ***Consult nephrology if iHD or CRRT indicated  - Obtain urine lytes - consider renal US   Best Practice (right click and "Reselect all SmartList Selections" daily)  Diet/type:  NPO DVT prophylaxis: LMWH GI prophylaxis: PPI Lines: {Central Venous Access:25771} Foley:  {Central Venous Access:25691} Code Status:  {Code Status:26939} Last date of multidisciplinary goals of care discussion [***]  Labs   CBC: Recent Labs  Lab 12/06/21 2201  WBC 18.9*  HGB 13.1  HCT 42.5  MCV 91.8  PLT 962    Basic Metabolic Panel: Recent Labs  Lab 12/06/21 2201  NA 137  K 4.2  CL 101  CO2 28  GLUCOSE 145*  BUN 13  CREATININE 1.30*  CALCIUM 8.9   GFR: Estimated Creatinine Clearance: 59.1 mL/min (A) (by C-G formula based on SCr of 1.3 mg/dL (H)). Recent Labs  Lab 12/06/21 2201  WBC 18.9*    Liver Function Tests: Recent Labs  Lab 12/06/21 2201  AST 24  ALT 13  ALKPHOS 74  BILITOT 0.5  PROT 7.6  ALBUMIN 3.6   No results for input(s): "LIPASE", "AMYLASE" in the last 168 hours. No results for input(s): "AMMONIA" in the last 168 hours.  ABG No results found for: "PHART", "PCO2ART", "PO2ART", "HCO3", "TCO2", "ACIDBASEDEF", "O2SAT"   Coagulation Profile: No results for input(s): "INR", "PROTIME" in the last 168 hours.  Cardiac Enzymes: No results for input(s): "CKTOTAL", "CKMB", "CKMBINDEX", "TROPONINI" in the last 168 hours.  HbA1C: No results found for: "HGBA1C"  CBG: No results for input(s): "GLUCAP" in the last 168 hours.  Review of Systems: Positives in BOLD***  Gen: Denies fever, chills, weight change, fatigue, night sweats HEENT: Denies blurred vision, double vision, hearing loss, tinnitus, sinus congestion, rhinorrhea, sore throat, neck stiffness, dysphagia PULM: Denies shortness of breath, cough, sputum production, hemoptysis, wheezing CV: Denies chest pain, edema, orthopnea, paroxysmal nocturnal dyspnea, palpitations GI: Denies abdominal pain, nausea, vomiting, diarrhea, hematochezia, melena, constipation, change in bowel habits GU: Denies dysuria, hematuria, polyuria, oliguria, urethral discharge Endocrine: Denies hot or cold  intolerance, polyuria, polyphagia or appetite change Derm: Denies rash, dry skin, scaling or peeling skin change Heme: Denies easy bruising, bleeding, bleeding gums Neuro: Denies headache, numbness, weakness, slurred speech, loss of memory or consciousness  Past Medical History:  She,  has a past medical history of Anxiety, Arthritis, Bipolar disorder ([place]), Bulging lumbar disc, COPD (chronic obstructive pulmonary disease) ([place]), Depression, Dyspnea, Fibromyalgia, GERD (gastroesophageal reflux disease), PONV (postoperative nausea and vomiting), Sleep apnea, and Wears dentures.   Surgical History:   Past Surgical History:  Procedure Laterality Date  . ABDOMINAL HYSTERECTOMY    . ABDOMINAL HYSTERECTOMY W/ PARTIAL VAGINACTOMY    . CARDIAC CATHETERIZATION  2012  . CESAREAN SECTION    . COLONOSCOPY    . COLONOSCOPY WITH PROPOFOL N/A 11/01/2016   Procedure: COLONOSCOPY WITH PROPOFOL;  Surgeon: [name], MD;  Location: [place];  Service: Endoscopy;  Laterality: N/A;  . COLONOSCOPY WITH PROPOFOL N/A 05/22/2021   Procedure: COLONOSCOPY WITH BIOPSY;  Surgeon: [name], MD;  Location: [place];  Service: Endoscopy;  Laterality: N/A;  . ESOPHAGOGASTRODUODENOSCOPY (EGD) WITH PROPOFOL N/A 11/01/2016   Procedure: ESOPHAGOGASTRODUODENOSCOPY (EGD) WITH PROPOFOL;  Surgeon: [name], MD;  Location: [place];  Service: Endoscopy;  Laterality: N/A;  . HIP ARTHROPLASTY Left 04/2020  . POLYPECTOMY  11/01/2016   Procedure: POLYPECTOMY;  Surgeon: [name], MD;  Location: [place];  Service: Endoscopy;;  . POLYPECTOMY N/A 05/22/2021   Procedure: POLYPECTOMY;  Surgeon: [name], MD;  Location: [place];  Service: Endoscopy;  Laterality: N/A;  . SEPTOPLASTY Left 12/27/2016   Procedure: SEPTOPLASTY,ENDOSCOPIC TRIMMING MIDDLE TURBINATE;  Surgeon: Margaretha Sheffield, MD;  Location: [facility];  Service: ENT;  Laterality: Left;  . WRIST GANGLION  EXCISION       Social History:   reports that she has been smoking cigarettes. She started smoking about 44 years ago. She has a 20.00 pack-year smoking history. She has never used smokeless tobacco. She reports that she does not drink alcohol and does not use drugs.   Family History:  Her family history includes Anxiety disorder in her mother and sister; Bipolar disorder in her mother, sister, sister, and sister; Breast cancer (age of onset: 66) in her mother; Depression in her mother and sister; Heart attack in her father; Hypertension in her father and sister; Lymphoma in her sister; Multiple sclerosis in her sister and another family member; Thyroid cancer in her father.   Allergies Allergies  Allergen Reactions  . Advair Diskus [Fluticasone-Salmeterol]     Caused thrush  . Amoxicillin   . Aspirin     Muscle and joint pain  . Breo Ellipta [Fluticasone Furoate-Vilanterol]     Caused thrush  . Nsaids Nausea And Vomiting  . Tape     Some adhesives cause blisters.  Tegaderm is OK.  . Doxycycline Rash  . Erythromycin Rash  . Penicillins Rash  . Sulfa Antibiotics Rash          Home Medications  Prior to Admission medications   Medication Sig Start Date End Date Taking? Authorizing Provider  albuterol (PROVENTIL HFA;VENTOLIN HFA) 108 (90 Base) MCG/ACT inhaler INHALE 2 PUFFS BY MOUTH EVERY 6 HOURS ASNEEDED FOR WHEEZING OR SHORTNESS OF BREATH 06/16/18   Wilhelmina Mcardle, MD  clindamycin (CLEOCIN) 150 MG capsule Take 1 capsule (150 mg total) by mouth 3 (three) times daily. 12/03/21   Hyatt, Max T, DPM  clonazePAM (KLONOPIN) 1 MG tablet Take 1 tablet (1 mg total) by mouth 4 (four) times daily as needed for anxiety. 09/15/21   [name], [name], MD  cyclobenzaprine (FLEXERIL) 10 MG tablet Take 10 mg by mouth 3 (three) times daily as needed. 07/16/21   [provider]  diclofenac Sodium (VOLTAREN) 1 % GEL Apply 2 g topically 4 (four) times daily. 04/02/21   [provider]   gabapentin (NEURONTIN) 300 MG capsule Take 300 mg by mouth 2 (two) times daily. 300 MG IN AM AND 600 MG AT BEDTIME 11/30/14   [provider]  HYDROcodone-acetaminophen (NORCO) 10-325 MG tablet Take 1 tablet by mouth. 07/23/21   [provider]  ketorolac (TORADOL) 60 MG/2ML SOLN injection Inject 60 mg into the muscle once. 07/23/21   [provider]  nortriptyline (PAMELOR) 50 MG capsule Take 1 capsule (50 mg total) by mouth at bedtime. 09/15/21   [name], [name], MD  ondansetron (ZOFRAN) 4 MG tablet Take 1 tablet (4 mg total) by mouth every 8 (eight) hours as needed. 12/03/21   Hyatt, Max T, DPM  oxyCODONE-acetaminophen (PERCOCET) 10-325 MG tablet Take 1 tablet by mouth every 8 (eight) hours as needed for up to 7 days for pain. 12/03/21 12/10/21  Hyatt, Max T, DPM  pantoprazole (PROTONIX) 40 MG tablet Take 40 mg by mouth daily.    [provider]  sertraline (ZOLOFT) 100  MG tablet Take 1.5 tablets (150 mg total) by mouth daily. 09/15/21 03/14/22  [name], [name], MD  sertraline (ZOLOFT) 50 MG tablet Take 1 tablet (50 mg total) by mouth daily. 11/10/21   [name], [name], MD  simvastatin (ZOCOR) 10 MG tablet Take 10 mg by mouth every evening.  01/17/14   [provider]  tiZANidine (ZANAFLEX) 4 MG tablet Take 4 mg by mouth 3 (three) times daily as needed. 08/14/21   [provider]     Critical care time: ***       Domingo Pulse Rust-[place], AGACNP-BC Acute Care Nurse Practitioner [facility]   [phone] / [phone] Please see Amion for pager details.

## 2021-12-06 NOTE — H&P (Incomplete)
NAME:  Denise Spencer, MRN:  [id number], DOB:  [birth year], LOS: 0 ADMISSION DATE:  12/06/2021, CONSULTATION DATE:  12/06/21 REFERRING MD:  Dr. Kerman Passey, CHIEF COMPLAINT:  Drug Overdose   History of Present Illness:  61 yo F presenting to [facility] ED from home via EMS on 12/06/21 with concern for possible drug overdose. Per the patient and family report bedside, the patient had right ankle surgery on 12/04/21 and received a prescription for Percocet 10 mg- 325 mg Q 8 hours. The patient reports she was having increased pain and the Dr. Rockey Situ her she could take the Percocet every 4-6 hours PRN. Patient was in her normal state of health until today (12/06/21) when her son gave her a dose of Percocet around noon, which the patient does not remember. Then she reports waking up and taking her Percocet and gabapentin possibly around 5 pm on 6/18. Her son came back to give her an 8 pm dose and found her unresponsive, but breathing and with a pulse. EMS was called at 9:30 pm. Per ED documentation, EMS found the patient unresponsive, breathing with a pulse, miotic pupils and a sandwich in her mouth. She was given 4 mg of narcan with good effect. Upon bedside assessment patient denies any suicidal ideation, unsure how this happened. Son confirmed that the oxycodone and her other medications sit next to her bed on a table. Family and patient confirm that nothing like this has ever happened before. Son counted Percocet stating that 11 tablets were used since Fri (12/04/21) evening. However, it is unclear how many the patient consumed as it is suspected her husband took some of her medication in addition to his own pain medication. Patient has also been taking Ibuprofen, it is also unclear how much/often she is taking 800 mg of Ibuprofen. Of note the patient's husband was admitted with acute respiratory failure requiring emergent intubation and mechanical ventilatory support s/t suspected unintentional overdose earlier same  day. Husband also on chronic pain medications, however, the husband's medications are across the room in a drawer inaccessible to his wife.  Patient admits to 20 pack year history, denies any oxygen use at home, denies any regular use of alcohol or recreational substances.  ED course: Upon arrival patient awake and alert but hypoxic requiring NRB to maintain Spo2 in low 90's. CXR shows white out appearance in left lung, consistent with aspiration and concerning for debris in airway. Sepsis protocol initiated. Lab work revealed leukocytosis, AKI, slightly elevated troponin and elevated PCT. Medications given: Levaquin Initial Vitals: 97.3, 18, 104, 95/69 & SpO2 91% on NRB Significant labs: (Labs/ Imaging personally reviewed) I, Domingo Pulse [place], AGACNP-BC, personally viewed and interpreted this ECG. EKG Interpretation: Date: 12/06/21, EKG Time: 21:58, Rate: 105, Rhythm: ST, QRS Axis:  normal, Intervals: normal, ST/T Wave abnormalities: none, Narrative Interpretation: ST Chemistry: Na+:137, K+: 4.2, BUN/Cr.: 13/1.30, Serum CO2/ AG: 28/8 Hematology: WBC: 18.9, Hgb: 13.1,  Troponin: 42, Lactic/ PCT: pending/ 6.32, COVID-19 & Influenza A/B: pending  CXR 12/06/21: Marked left-sided volume loss with mediastinal shift to the left. Superimposed consolidation throughout the left lung represents a combination of atelectasis and infiltrate. Possible aspirated foreign body or mucous plug within the left mainstem bronchus. Correlation with bronchoscopy may be helpful for further management CT chest wo contrast 12/06/21: pending  PCCM consulted for admission due to acute hypoxic respiratory failure and possible need for urgent bronchoscopy overnight.  Pertinent  Medical History  Bipolar disorder & anxiety & depression COPD Fibromyalgia Bulging lumbar disc (  L5-S1) OSA (no home O2) Tobacco use  Significant Hospital Events: Including procedures, antibiotic start and stop dates in addition to other  pertinent events   12/06/21: Admit to ICU with acute hypoxic respiratory failure s/t aspiration event in the setting of suspected unintentional opiate overdose.  Interim History / Subjective:  Patient awake and alert on NRB 15 L with stable vitals. Discussed plan of care, all questions and concerns answered at this time.  Objective   Blood pressure 95/69, pulse (!) 104, temperature (!) 97.3 F (36.3 C), temperature source Oral, resp. rate 17, height '5\' 7"'$  (1.702 m), weight 106 kg, SpO2 91 %.        Intake/Output Summary (Last 24 hours) at 12/06/2021 2300 Last data filed at 12/06/2021 2151 Gross per 24 hour  Intake 200 ml  Output --  Net 200 ml   Filed Weights   12/06/21 2202  Weight: 106 kg    Examination: General: Adult female, acutely ill, lying in bed, NAD HEENT: MM pink/moist, anicteric, atraumatic, neck supple Neuro: A&O x 4, able to follow commands, PERRL +3, MAE CV: s1s2 RRR, NSR on monitor, no r/m/g Pulm: Regular, non labored on NRB, breath sounds coarse with expiratory wheezes-RUL, coarse and very diminished- LUL & diminished-BLL GI: soft, rounded, non tender, bs x 4 Skin:  no rashes/lesions noted Extremities: warm/dry, pulses + 2 R/P, no edema noted  Resolved Hospital Problem list     Assessment & Plan:   Acute Hypoxic Respiratory Failure secondary to aspiration event in the setting of acute toxic encephalopathy due to suspected unintentional opiate overdose PMHx: COPD (patient reports "mild" asthma), OSA, tobacco use CT chest wo contrast obtained to assess for foreign debris in airway. If present, will intubate and Dr. Milon Dikes plans to bronch the patient overnight. Plan discussed with patient and family who are in agreement. Addendum: Directly before CT patient was able to cough up a couple of large chunks of [place] sandwich- reporting her breathing improved. CT chest revealed multilobar pneumonia without  any foreign body/debris in airway. Bronchoscopy not needed at  this time. - Supplemental O2 to maintain SpO2 > 90% - Intermittent chest x-ray & ABG PRN - Ensure adequate pulmonary hygiene: IS and flutter valve ordered - F/u cultures, trend PCT - Continue Aspiration Pna coverage: discussed with pharmacy, patient allergic to multiple antibiotics- best choice appears to be clindamycin IV - bronchodilators PRN  Suspected Sepsis without septic shock due to Aspiration event Lactic pending - Supplemental oxygen as needed, to maintain SpO2 > 90% - f/u cultures, trend lactic/ PCT - Daily CBC, monitor WBC/ fever curve - IV antibiotics: clindamycin as above - If lactic elevated will order IVF resuscitation, conservative management given how tenuous the current respiratory status  Elevated Troponin secondary to demand ischemia va NSTEMI Suspect demand ischemia - Trend troponin  - order Echocardiogram if needed - consider heparin drip PRN - continuous cardiac monitoring  Acute Kidney Injury in the setting of possible inappropriate NSAID use Baseline Cr: 0.99, Cr on admission:1.30 - Strict I/O's: alert provider if UOP < 0.5 mL/kg/hr - IVF resuscitation - Daily BMP, replace electrolytes PRN - Avoid nephrotoxic agents as able, ensure adequate renal perfusion > avoid Ibuprofen  Acute Toxic Encephalopathy secondary to suspected opiate overdose- resolved - supportive care - avoid sedating medications  Best Practice (right click and "Reselect all SmartList Selections" daily)  Diet/type: NPO DVT prophylaxis: LMWH GI prophylaxis: PPI Lines: N/A Foley:  N/A Code Status:  full code Last date of multidisciplinary goals  of care discussion [12/06/21]  Labs   CBC: Recent Labs  Lab 12/06/21 2201  WBC 18.9*  HGB 13.1  HCT 42.5  MCV 91.8  PLT 007    Basic Metabolic Panel: Recent Labs  Lab 12/06/21 2201  NA 137  K 4.2  CL 101  CO2 28  GLUCOSE 145*  BUN 13  CREATININE 1.30*  CALCIUM 8.9   GFR: Estimated Creatinine Clearance: 59.1 mL/min (A)  (by C-G formula based on SCr of 1.3 mg/dL (H)). Recent Labs  Lab 12/06/21 2201  WBC 18.9*    Liver Function Tests: Recent Labs  Lab 12/06/21 2201  AST 24  ALT 13  ALKPHOS 74  BILITOT 0.5  PROT 7.6  ALBUMIN 3.6   No results for input(s): "LIPASE", "AMYLASE" in the last 168 hours. No results for input(s): "AMMONIA" in the last 168 hours.  ABG No results found for: "PHART", "PCO2ART", "PO2ART", "HCO3", "TCO2", "ACIDBASEDEF", "O2SAT"   Coagulation Profile: No results for input(s): "INR", "PROTIME" in the last 168 hours.  Cardiac Enzymes: No results for input(s): "CKTOTAL", "CKMB", "CKMBINDEX", "TROPONINI" in the last 168 hours.  HbA1C: No results found for: "HGBA1C"  CBG: No results for input(s): "GLUCAP" in the last 168 hours.  Review of Systems: Positives in BOLD  Gen: Denies fever, chills, weight change, fatigue, night sweats HEENT: Denies blurred vision, double vision, hearing loss, tinnitus, sinus congestion, rhinorrhea, sore throat, neck stiffness, dysphagia PULM: Denies shortness of breath, cough, sputum production, hemoptysis, wheezing CV: Denies chest pain, edema, orthopnea, paroxysmal nocturnal dyspnea, palpitations GI: Denies abdominal pain, nausea, vomiting, diarrhea, hematochezia, melena, constipation, change in bowel habits GU: Denies dysuria, hematuria, polyuria, oliguria, urethral discharge Endocrine: Denies hot or cold intolerance, polyuria, polyphagia or appetite change Derm: Denies rash, dry skin, scaling or peeling skin change Heme: Denies easy bruising, bleeding, bleeding gums Neuro: Denies headache, numbness, weakness, slurred speech, loss of memory or consciousness  Past Medical History:  She,  has a past medical history of Anxiety, Arthritis, Bipolar disorder ([place]), Bulging lumbar disc, COPD (chronic obstructive pulmonary disease) ([place]), Depression, Dyspnea, Fibromyalgia, GERD (gastroesophageal reflux disease), PONV (postoperative nausea and  vomiting), Sleep apnea, and Wears dentures.   Surgical History:   Past Surgical History:  Procedure Laterality Date   ABDOMINAL HYSTERECTOMY     ABDOMINAL HYSTERECTOMY W/ PARTIAL VAGINACTOMY     CARDIAC CATHETERIZATION  2012   CESAREAN SECTION     COLONOSCOPY     COLONOSCOPY WITH PROPOFOL N/A 11/01/2016   Procedure: COLONOSCOPY WITH PROPOFOL;  Surgeon: [name], MD;  Location: [place];  Service: Endoscopy;  Laterality: N/A;   COLONOSCOPY WITH PROPOFOL N/A 05/22/2021   Procedure: COLONOSCOPY WITH BIOPSY;  Surgeon: [name], MD;  Location: [place];  Service: Endoscopy;  Laterality: N/A;   ESOPHAGOGASTRODUODENOSCOPY (EGD) WITH PROPOFOL N/A 11/01/2016   Procedure: ESOPHAGOGASTRODUODENOSCOPY (EGD) WITH PROPOFOL;  Surgeon: [name], MD;  Location: [place];  Service: Endoscopy;  Laterality: N/A;   HIP ARTHROPLASTY Left 04/2020   POLYPECTOMY  11/01/2016   Procedure: POLYPECTOMY;  Surgeon: [name], MD;  Location: [place];  Service: Endoscopy;;   POLYPECTOMY N/A 05/22/2021   Procedure: POLYPECTOMY;  Surgeon: [name], MD;  Location: [place];  Service: Endoscopy;  Laterality: N/A;   SEPTOPLASTY Left 12/27/2016   Procedure: SEPTOPLASTY,ENDOSCOPIC TRIMMING MIDDLE TURBINATE;  Surgeon: Margaretha Sheffield, MD;  Location: [facility];  Service: ENT;  Laterality: Left;   WRIST GANGLION EXCISION       Social History:  reports that she has been smoking cigarettes. She started smoking about 44 years ago. She has a 20.00 pack-year smoking history. She has never used smokeless tobacco. She reports that she does not drink alcohol and does not use drugs.   Family History:  Her family history includes Anxiety disorder in her mother and sister; Bipolar disorder in her mother, sister, sister, and sister; Breast cancer (age of onset: 33) in her mother; Depression in her mother and sister; Heart attack in her father; Hypertension in her father  and sister; Lymphoma in her sister; Multiple sclerosis in her sister and another family member; Thyroid cancer in her father.   Allergies Allergies  Allergen Reactions   Advair Diskus [Fluticasone-Salmeterol]     Caused thrush   Amoxicillin    Aspirin     Muscle and joint pain   Breo Ellipta [Fluticasone Furoate-Vilanterol]     Caused thrush   Nsaids Nausea And Vomiting   Tape     Some adhesives cause blisters.  Tegaderm is OK.   Doxycycline Rash   Erythromycin Rash   Penicillins Rash   Sulfa Antibiotics Rash          Home Medications  Prior to Admission medications   Medication Sig Start Date End Date Taking? Authorizing Provider  albuterol (PROVENTIL HFA;VENTOLIN HFA) 108 (90 Base) MCG/ACT inhaler INHALE 2 PUFFS BY MOUTH EVERY 6 HOURS ASNEEDED FOR WHEEZING OR SHORTNESS OF BREATH 06/16/18   Wilhelmina Mcardle, MD  clindamycin (CLEOCIN) 150 MG capsule Take 1 capsule (150 mg total) by mouth 3 (three) times daily. 12/03/21   Hyatt, Max T, DPM  clonazePAM (KLONOPIN) 1 MG tablet Take 1 tablet (1 mg total) by mouth 4 (four) times daily as needed for anxiety. 09/15/21   [name], [name], MD  cyclobenzaprine (FLEXERIL) 10 MG tablet Take 10 mg by mouth 3 (three) times daily as needed. 07/16/21   [provider]  diclofenac Sodium (VOLTAREN) 1 % GEL Apply 2 g topically 4 (four) times daily. 04/02/21   [provider]  gabapentin (NEURONTIN) 300 MG capsule Take 300 mg by mouth 2 (two) times daily. 300 MG IN AM AND 600 MG AT BEDTIME 11/30/14   [provider]  HYDROcodone-acetaminophen (NORCO) 10-325 MG tablet Take 1 tablet by mouth. 07/23/21   [provider]  ketorolac (TORADOL) 60 MG/2ML SOLN injection Inject 60 mg into the muscle once. 07/23/21   [provider]  nortriptyline (PAMELOR) 50 MG capsule Take 1 capsule (50 mg total) by mouth at bedtime. 09/15/21   [name], [name], MD  ondansetron (ZOFRAN) 4 MG tablet Take 1 tablet (4 mg total) by mouth every 8  (eight) hours as needed. 12/03/21   Hyatt, Max T, DPM  oxyCODONE-acetaminophen (PERCOCET) 10-325 MG tablet Take 1 tablet by mouth every 8 (eight) hours as needed for up to 7 days for pain. 12/03/21 12/10/21  Hyatt, Max T, DPM  pantoprazole (PROTONIX) 40 MG tablet Take 40 mg by mouth daily.    [provider]  sertraline (ZOLOFT) 100 MG tablet Take 1.5 tablets (150 mg total) by mouth daily. 09/15/21 03/14/22  [name], [name], MD  sertraline (ZOLOFT) 50 MG tablet Take 1 tablet (50 mg total) by mouth daily. 11/10/21   [name], [name], MD  simvastatin (ZOCOR) 10 MG tablet Take 10 mg by mouth every evening.  01/17/14   [provider]  tiZANidine (ZANAFLEX) 4 MG tablet Take 4 mg by mouth 3 (three) times daily as needed. 08/14/21  [provider]     Critical care time: 62 minutes       Venetia Night, AGACNP-BC Acute Care Nurse Practitioner [facility]   [phone] / [phone] Please see Amion for pager details.

## 2021-12-06 NOTE — Sepsis Progress Note (Signed)
Elink following code sepsis °

## 2021-12-06 NOTE — Progress Notes (Signed)
PHARMACIST - PHYSICIAN COMMUNICATION  CONCERNING:  Enoxaparin (Lovenox) for DVT Prophylaxis    RECOMMENDATION: Patient was prescribed enoxaprin '40mg'$  q24 hours for VTE prophylaxis.   Filed Weights   12/06/21 2202  Weight: 106 kg (233 lb 11 oz)    Body mass index is 36.6 kg/m.  Estimated Creatinine Clearance: 59.1 mL/min (A) (by C-G formula based on SCr of 1.3 mg/dL (H)).   Based on [place] patient is candidate for enoxaparin 0.'5mg'$ /kg TBW SQ every 24 hours based on BMI being >30.  DESCRIPTION: Pharmacy has adjusted enoxaparin dose per [facility] policy.  Patient is now receiving enoxaparin 0.5 mg/kg every 24 hours   Renda Rolls, PharmD, [facility] 12/06/2021 11:01 PM

## 2021-12-06 NOTE — Sepsis Progress Note (Signed)
Notified provider of need to order lactic acid. Will continue to monitor code sepsis.

## 2021-12-06 NOTE — ED Provider Notes (Signed)
[facility] Provider Note    Event Date/Time   First MD Initiated Contact with Patient 12/06/21 2206     (approximate)  History   Chief Complaint: Drug Overdose  HPI  Denise Spencer is a 61 y.o. female with a past medical history of anxiety, arthritis, bipolar, depression, fibromyalgia, presents to the emergency department after possible overdose.  According to the patient for the past week or so she has been taking oxycodone since she had an ankle surgery.  Per report patient was found tonight around 8:30 PM unresponsive by family members.  EMS was called around 9:30 PM.  EMS states upon their arrival patient is unresponsive with food in her mouth.  Patient was given 4 mg of intranasal Narcan.  Patient arrives to the emergency department awake alert and oriented on a nonrebreather mask at 15 L satting 90 to 92%, no baseline O2 requirement.  Patient denies any SI or HI.  However per report patient's husband also overdosed this morning on pain medication requiring intubation and admission to the hospital.  Patient claims that she did not take any of his medications that they have their own medications.  She denies any other substances such as alcohol or drugs.  Denies any intent to hurt herself.  Physical Exam   Triage Vital Signs: ED Triage Vitals  Enc Vitals Group     BP 12/06/21 2201 95/69     Pulse Rate 12/06/21 2201 (!) 104     Resp 12/06/21 2201 17     Temp 12/06/21 2201 (!) 97.3 F (36.3 C)     Temp Source 12/06/21 2201 Oral     SpO2 12/06/21 2151 (!) 87 %     Weight 12/06/21 2202 233 lb 11 oz (106 kg)     Height --      Head Circumference --      Peak Flow --      Pain Score 12/06/21 2202 0     Pain Loc --      Pain Edu? --      Excl. in [place]? --     Most recent vital signs: Vitals:   12/06/21 2200 12/06/21 2201  BP:  95/69  Pulse:  (!) 104  Resp:  17  Temp:  (!) 97.3 F (36.3 C)  SpO2: 91% 91%    General: Awake, no distress.   CV:  Good peripheral perfusion.  Regular rate and rhythm  Resp:  Normal effort.  Equal breath sounds bilaterally.  Mild rhonchi auscultated bilaterally. Abd:  No distention.  Soft, nontender.  No rebound or guarding.    ED Results / Procedures / Treatments   EKG  EKG viewed and interpreted by myself shows sinus tachycardia 105 bpm, narrow QRS, normal axis, normal intervals, nonspecific but no concerning ST changes.  RADIOLOGY  I have interpreted the patient's chest x-ray she appears to have a white out lung on the left side. Radiology is read the x-ray as atelectasis pneumonia and possible foreign body.   MEDICATIONS ORDERED IN ED: Medications  levofloxacin (LEVAQUIN) IVPB 750 mg (has no administration in time range)     IMPRESSION / MDM / ASSESSMENT AND PLAN / ED COURSE  I reviewed the triage vital signs and the nursing notes.  Patient's presentation is most consistent with acute presentation with potential threat to life or bodily function.  Patient presents to the emergency department after apparent overdose at home.  Patient states she took her pain pill that she  has been taking over the past week.  Per EMS patient was unresponsive with pinpoint pupils received 4 mg of intranasal Narcan.  On arrival patient is awake alert and oriented she continues to have rhonchi and is satting 91% on a nonrebreather.  EMS states they had to pull fluid out of her mouth, concern for likely aspiration.  We will cover with antibiotics for aspiration pneumonia we will send blood cultures we will check labs we will obtain a chest x-ray.  Patient adamantly denies any intent to hurt herself.  She denies any other substances.  It is suspicious that the husband and wife both overdosed on the same day, police are involved and currently speaking to the patient.  They have also searched the house but did not find any illegal substances per police.  Given the patient's hypoxia she will require admission to the  hospital once her emergency department work-up is completed.  She remains awake alert and oriented and able to answer all questions and follow commands.  Patient is x-ray looks like a white out appearance of the left lung consistent with possible aspiration foreign body.  Patient being covered with antibiotics for pneumonia White blood cell count is elevated to 18,000 she is tachycardic meeting sepsis criteria.  Given the patient's x-ray findings satting 91% on 15 L nonrebreather I spoke to the ICU who will be admitting the patient to their service as the patient will require a bronchoscopy.  Patient agreeable to plan.   CRITICAL CARE Performed by: Harvest Dark   Total critical care time: 30 minutes  Critical care time was exclusive of separately billable procedures and treating other patients.  Critical care was necessary to treat or prevent imminent or life-threatening deterioration.  Critical care was time spent personally by me on the following activities: development of treatment plan with patient and/or surrogate as well as nursing, discussions with consultants, evaluation of patient's response to treatment, examination of patient, obtaining history from patient or surrogate, ordering and performing treatments and interventions, ordering and review of laboratory studies, ordering and review of radiographic studies, pulse oximetry and re-evaluation of patient's condition.   FINAL CLINICAL IMPRESSION(S) / ED DIAGNOSES   Accidental overdose Aspiration pneumonia Hypoxia   Note:  This document was prepared using Dragon voice recognition software and may include unintentional dictation errors.   Harvest Dark, MD 12/06/21 2250

## 2021-12-06 NOTE — ED Triage Notes (Signed)
Pt was found unresponsive by family at 32 and pt's family called 55 at 2100. Pt was being bagged by firefighter with nasal trumpet in place. '4mg'$  of Narcan administered inter-nasally. Pt given IV and 262m of NS. Pt placed on Non-breather at hospital due to low sats. Pt is alert and oriented times 4. Pt sais she only took one oxycodone. Denies any other medications taken.

## 2021-12-07 ENCOUNTER — Encounter: Payer: Self-pay | Admitting: Internal Medicine

## 2021-12-07 ENCOUNTER — Inpatient Hospital Stay: Payer: Medicare Other

## 2021-12-07 DIAGNOSIS — J9601 Acute respiratory failure with hypoxia: Secondary | ICD-10-CM

## 2021-12-07 DIAGNOSIS — J690 Pneumonitis due to inhalation of food and vomit: Secondary | ICD-10-CM

## 2021-12-07 LAB — BASIC METABOLIC PANEL
Anion gap: 6 (ref 5–15)
BUN: 13 mg/dL (ref 6–20)
CO2: 30 mmol/L (ref 22–32)
Calcium: 8.7 mg/dL — ABNORMAL LOW (ref 8.9–10.3)
Chloride: 102 mmol/L (ref 98–111)
Creatinine, Ser: 0.98 mg/dL (ref 0.44–1.00)
GFR, Estimated: >60 mL/min (ref >60)
Glucose, Bld: 118 mg/dL — ABNORMAL HIGH (ref 70–99)
Potassium: 4.2 mmol/L (ref 3.5–5.1)
Sodium: 138 mmol/L (ref 135–145)

## 2021-12-07 LAB — PHOSPHORUS: Phosphorus: 2.7 mg/dL (ref 2.5–4.6)

## 2021-12-07 LAB — CBC
HCT: 35.8 % — ABNORMAL LOW (ref 36.0–46.0)
Hemoglobin: 11.2 g/dL — ABNORMAL LOW (ref 12.0–15.0)
MCH: 28.4 pg (ref 26.0–34.0)
MCHC: 31.3 g/dL (ref 30.0–36.0)
MCV: 90.9 fL (ref 80.0–100.0)
Platelets: 196 10*3/uL (ref 150–400)
RBC: 3.94 MIL/uL (ref 3.87–5.11)
RDW: 14.1 % (ref 11.5–15.5)
WBC: 17.3 10*3/uL — ABNORMAL HIGH (ref 4.0–10.5)
nRBC: 0 % (ref 0.0–0.2)

## 2021-12-07 LAB — URINE DRUG SCREEN, QUALITATIVE (ARMC ONLY)
Amphetamines, Ur Screen: NOT DETECTED
Barbiturates, Ur Screen: NOT DETECTED
Benzodiazepine, Ur Scrn: POSITIVE — AB
Cannabinoid 50 Ng, Ur ~~LOC~~: NOT DETECTED
Cocaine Metabolite,Ur ~~LOC~~: NOT DETECTED
MDMA (Ecstasy)Ur Screen: NOT DETECTED
Methadone Scn, Ur: NOT DETECTED
Opiate, Ur Screen: POSITIVE — AB
Phencyclidine (PCP) Ur S: NOT DETECTED
Tricyclic, Ur Screen: POSITIVE — AB

## 2021-12-07 LAB — LACTIC ACID, PLASMA
Lactic Acid, Venous: 1.6 mmol/L (ref 0.5–1.9)
Lactic Acid, Venous: 2.3 mmol/L (ref 0.5–1.9)

## 2021-12-07 LAB — GLUCOSE, CAPILLARY
Glucose-Capillary: 110 mg/dL — ABNORMAL HIGH (ref 70–99)
Glucose-Capillary: 105 mg/dL — ABNORMAL HIGH (ref 70–99)
Glucose-Capillary: 90 mg/dL (ref 70–99)

## 2021-12-07 LAB — MRSA NEXT GEN BY PCR, NASAL: MRSA by PCR Next Gen: NOT DETECTED

## 2021-12-07 LAB — TROPONIN I (HIGH SENSITIVITY): Troponin I (High Sensitivity): 57 ng/L — ABNORMAL HIGH (ref <18)

## 2021-12-07 LAB — MAGNESIUM: Magnesium: 2.1 mg/dL (ref 1.7–2.4)

## 2021-12-07 LAB — HIV ANTIBODY (ROUTINE TESTING W REFLEX): HIV Screen 4th Generation wRfx: NONREACTIVE

## 2021-12-07 LAB — PROCALCITONIN: Procalcitonin: 3.19 ng/mL

## 2021-12-07 LAB — SARS CORONAVIRUS 2 BY RT PCR: SARS Coronavirus 2 by RT PCR: NEGATIVE

## 2021-12-07 MED ORDER — CLINDAMYCIN PHOSPHATE 600 MG/50ML IV SOLN
600.0000 mg | Freq: Two times a day (BID) | INTRAVENOUS | Status: DC
  Administered 2021-12-07: 600 mg via INTRAVENOUS
  Filled 2021-12-07 (×2): qty 50

## 2021-12-07 MED ORDER — LACTATED RINGERS IV BOLUS
1000.0000 mL | Freq: Once | INTRAVENOUS | Status: AC
  Administered 2021-12-07: 1000 mL via INTRAVENOUS

## 2021-12-07 MED ORDER — ALBUTEROL SULFATE (2.5 MG/3ML) 0.083% IN NEBU: 2.5000 mg | INHALATION_SOLUTION | RESPIRATORY_TRACT | Status: DC | PRN

## 2021-12-07 MED ORDER — OXYCODONE-ACETAMINOPHEN 5-325 MG PO TABS
1.0000 | ORAL_TABLET | ORAL | Status: DC | PRN
  Administered 2021-12-07: 1 via ORAL
  Filled 2021-12-07: qty 1

## 2021-12-07 NOTE — Progress Notes (Signed)
CHIEF COMPLAINT:   Chief Complaint  Patient presents with   Drug Overdose   SYNOPSIS 60/F with recent ankle surgery on 6/16, brought in after being found unresponsive.  Suspected drug overdose as patient had increased use of percocet 10/325 for increasing knee pain.  Pt given Narcan with good effect.    In the ED, CXR showed volume loss on the left chest, representing consolidation with atelectasis and infiltrates.  There is possible aspirated foreign body.  CT chest already shows improvement of aeration on the left chest.  Findings most consistent with multilobar pneumonia    BP 110/56, HR 96, RR 15, O2 sats 96% on nasal cannula.    HPI Pt is awake and alert now. Minimal oxygen  Objective    BP 117/73 (BP Location: Left Arm)   Pulse 96   Temp 98 F (36.7 C) (Oral)   Resp 15   Ht '5\' 7"'$  (1.702 m)   Wt 91.6 kg   SpO2 99%   BMI 31.63 kg/m    Review of Systems: Gen:  Denies  fever, sweats, chills weight loss  HEENT: Denies blurred vision, double vision, ear pain, eye pain, hearing loss, nose bleeds, sore throat Cardiac:  No dizziness, chest pain or heaviness, chest tightness,edema, No JVD Resp:   + cough, -sputum production, -shortness of breath,-wheezing, -hemoptysis,  Other:  All other systems negative    Physical Examination:   General Appearance: No distress  EYES PERRLA, EOM intact.   NECK Supple, No JVD Pulmonary: normal breath sounds, No wheezing.  CardiovascularNormal S1,S2.  No m/r/g.   ALL OTHER ROS ARE NEGATIVE    VITALS:  height is '5\' 7"'$  (1.702 m) and weight is 91.6 kg. Her oral temperature is 98 F (36.7 C). Her blood pressure is 117/73 and her pulse is 96. Her respiration is 15 and oxygen saturation is 99%.   I personally reviewed Labs under Results section.  Radiology Reports CT CHEST WO CONTRAST  Result Date: 12/06/2021 CLINICAL DATA:  Aspiration.  Concern for foreign body. EXAM: CT CHEST WITHOUT CONTRAST TECHNIQUE: Multidetector CT imaging  of the chest was performed following the standard protocol without IV contrast. RADIATION DOSE REDUCTION: This exam was performed according to the departmental dose-optimization program which includes automated exposure control, adjustment of the mA and/or kV according to patient size and/or use of iterative reconstruction technique. COMPARISON:  Chest radiograph dated 12/06/2021. FINDINGS: Evaluation of this exam is limited in the absence of intravenous contrast. Cardiovascular: There is no cardiomegaly or pericardial effusion. The thoracic aorta and central pulmonary arteries are grossly unremarkable. Mediastinum/Nodes: No obvious hilar or mediastinal adenopathy. Evaluation however is limited due to respiratory motion and in the absence of intravenous contrast. The esophagus and the thyroid gland are grossly unremarkable. No mediastinal fluid collection. Lungs/Pleura: Scattered pulmonary nodules throughout the right lung as well as patchy and streaky areas of airspace opacity in the left lung most consistent with pneumonia, possibly atypical in etiology or aspiration. Clinical correlation is recommended. No pleural effusion or pneumothorax. The central airways are patent. Upper Abdomen: Gallstone. No pericholecystic fluid or evidence of acute cholecystitis by CT. Musculoskeletal: No chest wall mass or suspicious bone lesions identified. IMPRESSION: 1. Findings most consistent with multilobar pneumonia, likely atypical or viral in etiology. Aspiration is not excluded. 2. Cholelithiasis. Electronically Signed   By: Anner Crete M.D.   On: 12/06/2021 23:48   DG Chest 1 View  Result Date: 12/06/2021 CLINICAL DATA:  Dyspnea EXAM: CHEST  1 VIEW  COMPARISON:  04/14/2020 FINDINGS: There is marked left-sided volume loss with mediastinal shift to the left. Superimposed consolidation throughout the left lung represents a combination of atelectasis and infiltrate. There is an abrupt cut off of the left mainstem  bronchus suggesting a in intraluminal filling defect such as a aspirated foreign body or mucous plug. Right lung is clear. No pneumothorax. No pleural effusion on the right. Small pleural effusion on the left difficult to exclude. Cardiac size is within normal limits. IMPRESSION: Marked left-sided volume loss with mediastinal shift to the left. Superimposed consolidation throughout the left lung represents a combination of atelectasis and infiltrate. Possible aspirated foreign body or mucous plug within the left mainstem bronchus. Correlation with bronchoscopy may be helpful for further management. Electronically Signed   By: Fidela Salisbury M.D.   On: 12/06/2021 22:31       Assessment/Plan:  ACUTE DRUG OD WITH ACUTE ASPIRATION PNEUMONITIS RESOLVED WITH COUGH AND MINIMAL OXYGEN   BD THERAPY AS NEEDED WILL STOP ABX NARCOTICS TO BE ADJUSTED  ICU NEED RESOLVED SD STATUS   TRANSFER TO [facility]     DVT/GI PRX  assessed I Assessed the need for Labs I Assessed the need for Foley I Assessed the need for Central Venous Line Family Discussion when available I Assessed the need for Mobilization I made an Assessment of medications to be adjusted accordingly Safety Risk assessment completed  CASE DISCUSSED IN MULTIDISCIPLINARY ROUNDS WITH ICU TEAM      Danne Vasek Patricia Pesa, M.D.  [facility]  Medical Director [place] Director [place] Department

## 2021-12-07 NOTE — Progress Notes (Signed)
Pt advised me she would like to leave AMA, I informed MD and got pt to sign AMA ppwk and advised her that we cannot give her any pain meds or antibiotics(per MD), she understood and agreed. All lines removed, pt advised of proper administration of at home pain meds and ways to keep from taking too much again. Pt advised and teach back was administered for IS and flutter valve. All questions answered and addressed. Pt discharged from facility via wheelchair, Aox4, RA, vitals WNL along with all belongings.

## 2021-12-07 NOTE — Progress Notes (Signed)
Pharmacy Antibiotic Note  Denise Spencer is a 61 y.o. female admitted on 12/06/2021 with aspiration pneumonia.  Pharmacy has been consulted for Clindamycin dosing due to multiple abx allergies.  Plan: Clindamycin 600 mg IV q12h per indication & renal fxn. Pharmacy will continue to follow and will adjust abx dosing whenever warranted.  Height: '5\' 7"'$  (170.2 cm) Weight: 91.6 kg (201 lb 15.1 oz) IBW/kg (Calculated) : 61.6  Temp (24hrs), Avg:97.7 F (36.5 C), Min:97.3 F (36.3 C), Max:98 F (36.7 C)  Recent Labs  Lab 12/06/21 2201 12/07/21 0014  WBC 18.9*  --   CREATININE 1.30*  --   LATICACIDVEN  --  2.3*    Estimated Creatinine Clearance: 54.8 mL/min (A) (by C-G formula based on SCr of 1.3 mg/dL (H)).    Allergies  Allergen Reactions   Advair Diskus [Fluticasone-Salmeterol]     Caused thrush   Amoxicillin    Aspirin     Muscle and joint pain   Breo Ellipta [Fluticasone Furoate-Vilanterol]     Caused thrush   Nsaids Nausea And Vomiting   Tape     Some adhesives cause blisters.  Tegaderm is OK.   Doxycycline Rash   Erythromycin Rash   Penicillins Rash   Sulfa Antibiotics Rash         Antimicrobials this admission: 6/18 Levaquin >> x 1 dose 6/19 Clindamycin >>   Microbiology results: 6/18 BCx: Pending  Thank you for allowing pharmacy to be a part of this patient's care.  Renda Rolls, PharmD, MBA 12/07/2021 1:50 AM

## 2021-12-07 NOTE — Discharge Summary (Signed)
Physician Discharge Summary  Patient ID: Denise Spencer MRN: [id number] DOB/AGE: April 06, 1961 61 y.o.  Admit date: 12/06/2021 Discharge date: 12/07/2021  Admission Diagnoses: Encephalopathy from narcotic use leading to aspiration pneumonitis  Discharge Diagnoses:  Principal Problem:   Overdose Active Problems:   Aspiration pneumonia of left lung ([place])   Acute respiratory failure with hypoxia [facility])  61 yo F presenting to [facility] ED from home via EMS on 12/06/21 with concern for possible drug overdose. Per the patient and family report bedside, the patient had right ankle surgery on 12/04/21 and received a prescription for Percocet 10 mg- 325 mg Q 8 hours. The patient reports she was having increased pain and the Dr. Rockey Situ her she could take the Percocet every 4-6 hours PRN. Patient was in her normal state of health until today (12/06/21) when her son gave her a dose of Percocet around noon, which the patient does not remember. Then she reports waking up and taking her Percocet and gabapentin possibly around 5 pm on 6/18. Her son came back to give her an 8 pm dose and found her unresponsive, but breathing and with a pulse. EMS was called at 9:30 pm. Per ED documentation, EMS found the patient unresponsive, breathing with a pulse, miotic pupils and a sandwich in her mouth. She was given 4 mg of narcan with good effect. Upon bedside assessment patient denies any suicidal ideation, unsure how this happened. Son confirmed that the oxycodone and her other medications sit next to her bed on a table. Family and patient confirm that nothing like this has ever happened before. Son counted Percocet stating that 11 tablets were used since Fri (12/04/21) evening. However, it is unclear how many the patient consumed as it is suspected her husband took some of her medication in addition to his own pain medication. Patient has also been taking Ibuprofen, it is also unclear how much/often she is taking 800 mg of  Ibuprofen. Of note the patient's husband was admitted with acute respiratory failure requiring emergent intubation and mechanical ventilatory support s/t suspected unintentional overdose earlier same day. Husband also on chronic pain medications, however, the husband's medications are across the room in a drawer inaccessible to his wife.  Patient admits to 20 pack year history, denies any oxygen use at home, denies any regular use of alcohol or recreational substances.   ED course: Upon arrival patient awake and alert but hypoxic requiring NRB to maintain Spo2 in low 90's. CXR shows white out appearance in left lung, consistent with aspiration and concerning for debris in airway. Sepsis protocol initiated. Lab work revealed leukocytosis, AKI, slightly elevated troponin and elevated PCT. Medications given: Levaquin Initial Vitals: 97.3, 18, 104, 95/69 & SpO2 91% on NRB Significant labs: (Labs/ Imaging personally reviewed) I, Domingo Pulse [place], AGACNP-BC, personally viewed and interpreted this ECG. EKG Interpretation: Date: 12/06/21, EKG Time: 21:58, Rate: 105, Rhythm: ST, QRS Axis:  normal, Intervals: normal, ST/T Wave abnormalities: none, Narrative Interpretation: ST Chemistry: Na+:137, K+: 4.2, BUN/Cr.: 13/1.30, Serum CO2/ AG: 28/8 Hematology: WBC: 18.9, Hgb: 13.1,  Troponin: 42, Lactic/ PCT: pending/ 6.32, COVID-19 & Influenza A/B: pending   CXR 12/06/21: Marked left-sided volume loss with mediastinal shift to the left. Superimposed consolidation throughout the left lung represents a combination of atelectasis and infiltrate. Possible aspirated foreign body or mucous plug within the left mainstem bronchus. Correlation with bronchoscopy may be helpful for further management CT chest wo contrast 12/06/21: pending   PCCM consulted for admission due to acute hypoxic respiratory  failure and possible need for urgent bronchoscopy overnight.   Pertinent  Medical History  Bipolar disorder &  anxiety & depression COPD Fibromyalgia Bulging lumbar disc (L5-S1) OSA (no home O2) Tobacco use   Significant Hospital Events: Including procedures, antibiotic start and stop dates in addition to other pertinent events   12/06/21: Admit to ICU with acute hypoxic respiratory failure s/t aspiration event in the setting of suspected unintentional opiate overdose. 6/19 CXR shows improvement, ALERT AND AWAKE, SIGNING OUT AMA     Acute Hypoxic Respiratory Failure secondary to aspiration event in the setting of acute toxic encephalopathy due to suspected unintentional opiate overdose PMHx: COPD (patient reports "mild" asthma), OSA, tobacco use CT chest wo contrast obtained to assess for foreign debris in airway. If present, will intubate and Dr. Milon Dikes plans to bronch the patient overnight. Plan discussed with patient and family who are in agreement. Addendum: Directly before CT patient was able to cough up a couple of large chunks of [place] sandwich- reporting her breathing improved. CT chest revealed multilobar pneumonia without  any foreign body/debris in airway. Bronchoscopy not needed at this time. - Supplemental O2 to maintain SpO2 > 90% - Intermittent chest x-ray & ABG PRN - Ensure adequate pulmonary hygiene: IS and flutter valve ordered - F/u cultures, trend PCT - Continue Aspiration Pna coverage: discussed with pharmacy, patient allergic to multiple antibiotics- best choice appears to be clindamycin IV - bronchodilators PRN   Suspected Sepsis without septic shock due to Aspiration event Lactic pending - Supplemental oxygen as needed, to maintain SpO2 > 90% - f/u cultures, trend lactic/ PCT - Daily CBC, monitor WBC/ fever curve - IV antibiotics: clindamycin as above - If lactic elevated will order IVF resuscitation, conservative management given how tenuous the current respiratory status   Elevated Troponin secondary to demand ischemia va NSTEMI Suspect demand ischemia - Trend  troponin  - order Echocardiogram if needed - consider heparin drip PRN - continuous cardiac monitoring   Acute Kidney Injury in the setting of possible inappropriate NSAID use Baseline Cr: 0.99, Cr on admission:1.30 - Strict I/O's: alert provider if UOP < 0.5 mL/kg/hr - IVF resuscitation - Daily BMP, replace electrolytes PRN - Avoid nephrotoxic agents as able, ensure adequate renal perfusion > avoid Ibuprofen   Acute Toxic Encephalopathy secondary to suspected opiate overdose- resolved - supportive care - avoid sedating medications Discharge Exam: Blood pressure 135/74, pulse (!) 101, temperature 98.2 F (36.8 C), temperature source Oral, resp. rate (!) 9, height '5\' 7"'$  (1.702 m), weight 91.6 kg, SpO2 99 %. ALert and awake, follows commands Patient is SIGNING OUT AMA   The Risks and Benefits of LEAVING [place]  were explained to patient  I have discussed the risk for Acute Bleeding, increased chance of Infection, increased chance of Respiratory Failure and Cardiac Arrest, increased chance of pneumothorax and collapsed lung, as well as increased Stroke and Death.     Disposition: LEFT AMA   Allergies as of 12/07/2021       Reactions   Advair Diskus [fluticasone-salmeterol]    Caused thrush   Amoxicillin    Aspirin    Muscle and joint pain   Breo Ellipta [fluticasone Furoate-vilanterol]    Caused thrush   Nsaids Nausea And Vomiting   Tape    Some adhesives cause blisters.  Tegaderm is OK.   Doxycycline Rash   Erythromycin Rash   Penicillins Rash   Sulfa Antibiotics Rash  Medication List     STOP taking these medications    albuterol 108 (90 Base) MCG/ACT inhaler Commonly known as: VENTOLIN HFA   clindamycin 150 MG capsule Commonly known as: CLEOCIN   clonazePAM 1 MG tablet Commonly known as: KLONOPIN   gabapentin 300 MG capsule Commonly known as: NEURONTIN   nortriptyline 50 MG capsule Commonly known as: PAMELOR    ondansetron 4 MG tablet Commonly known as: Zofran   oxyCODONE-acetaminophen 10-325 MG tablet Commonly known as: Percocet   pantoprazole 40 MG tablet Commonly known as: PROTONIX   sertraline 100 MG tablet Commonly known as: Zoloft   simvastatin 10 MG tablet Commonly known as: ZOCOR   tiZANidine 4 MG tablet Commonly known as: ZANAFLEX         Signed: Flora Lipps 12/07/2021, 10:51 AM

## 2021-12-07 NOTE — Progress Notes (Signed)
PHARMACY CONSULT NOTE - FOLLOW UP  Pharmacy Consult for Electrolyte Monitoring and Replacement   Recent Labs: Potassium (mmol/L)  Date Value  12/07/2021 4.2  02/22/2012 4.0   Magnesium (mg/dL)  Date Value  12/07/2021 2.1   Calcium (mg/dL)  Date Value  12/07/2021 8.7 (L)   Calcium, Total (mg/dL)  Date Value  02/22/2012 8.7   Albumin (g/dL)  Date Value  12/06/2021 3.6   Phosphorus (mg/dL)  Date Value  12/07/2021 2.7   Sodium (mmol/L)  Date Value  12/07/2021 138  02/22/2012 140   Corrected calcium: 9.02 mg/dL  Assessment: 61 year old female who presented to ED after being found unresponsive by family. Past medical history significant for COPD, OSA, tobacco use (20 pack years) Received naloxone x 1 while en route to ED. Admitted with aspiration pneumonia, AKI, and acute toxic encephalopathy 2/2 suspected opiate overdose.  Goal of Therapy:  Within normal limits  Plan:  No replacement warranted at this time Follow up AM labs  Wynelle Cleveland, PharmD Pharmacy Resident  12/07/2021 7:39 AM

## 2021-12-07 NOTE — Progress Notes (Signed)
[facility] Progress Note Patient Name: Denise Spencer DOB: Aug 29, 1960 MRN: [id number]   Date of Service  12/07/2021  HPI/Events of Note  60/F with recent ankle surgery on 6/16, brought in after being found unresponsive.  Suspected drug overdose as patient had increased use of percocet 10/325 for increasing knee pain.  Pt given Narcan with good effect.   In the ED, CXR showed volume loss on the left chest, representing consolidation with atelectasis and infiltrates.  There is possible aspirated foreign body.  CT chest already shows improvement of aeration on the left chest.  Findings most consistent with multilobar pneumonia   BP 110/56, HR 96, RR 15, O2 sats 96% on nasal cannula. Pt is awake and alert now.  eICU Interventions  Encephalopathy Sepsis Pneumonia AKI Elevated troponin S/p ankle surgery  Hold off on sedating medications.  Continue empiric antibiotics.  Start chest PT. Pt reportedly coughed up chunks of [place] sandwich.  No indication for bronchoscopy at this point.  Monitor I&Os.  Lovenox for DVT prophylaxis.  Pantoprazole for GI prophylaxis.       Intervention Category Evaluation Type: New Patient Evaluation  Elsie Lincoln 12/07/2021, 12:20 AM

## 2021-12-07 NOTE — Plan of Care (Signed)

## 2021-12-09 ENCOUNTER — Ambulatory Visit (INDEPENDENT_AMBULATORY_CARE_PROVIDER_SITE_OTHER): Payer: Medicare Other | Admitting: Podiatry

## 2021-12-09 ENCOUNTER — Ambulatory Visit (INDEPENDENT_AMBULATORY_CARE_PROVIDER_SITE_OTHER): Payer: Medicare Other

## 2021-12-09 ENCOUNTER — Encounter: Payer: Self-pay | Admitting: Podiatry

## 2021-12-09 VITALS — BP 163/108 | HR 106 | Temp 98.6°F

## 2021-12-09 DIAGNOSIS — M2011 Hallux valgus (acquired), right foot: Secondary | ICD-10-CM

## 2021-12-09 DIAGNOSIS — Z9889 Other specified postprocedural states: Secondary | ICD-10-CM

## 2021-12-09 MED ORDER — VITAMIN D (ERGOCALCIFEROL) 1.25 MG (50000 UNIT) PO CAPS: 50000.0000 [IU] | ORAL_CAPSULE | ORAL | 0 refills | Status: DC

## 2021-12-09 NOTE — Progress Notes (Signed)
She presents today for her first postop visit date of surgery 12/04/2021 Lapidus procedure with [facility] osteotomy she states that it felt fine she states that she has fallen a couple of times from the scooter but has not injured the foot.  Denies fever chills nausea vomiting muscle aches pains calf pain back pain chest pain shortness of breath.  Objective: Cast is intact loose at the top and she has good range of motion of the toes tactile sensation is intact.  Radiographs taken today demonstrate internal fixation with Lapidus procedure is intact as well as the Aiken osteotomy.  Assessment well-healing surgical foot status post 1 week.  Plan: Follow-up with me in 1 week continue nonweightbearing status cast change next visit

## 2021-12-09 NOTE — Addendum Note (Signed)
Addended by: Rip Harbour on: 12/09/2021 09:05 AM   Modules accepted: Orders

## 2021-12-11 LAB — CULTURE, BLOOD (ROUTINE X 2): Culture: NO GROWTH

## 2021-12-12 LAB — CULTURE, BLOOD (ROUTINE X 2)
Culture: NO GROWTH
Special Requests: ADEQUATE

## 2021-12-16 ENCOUNTER — Encounter: Payer: Self-pay | Admitting: Podiatry

## 2021-12-16 ENCOUNTER — Ambulatory Visit (INDEPENDENT_AMBULATORY_CARE_PROVIDER_SITE_OTHER): Payer: Medicare Other | Admitting: Podiatry

## 2021-12-16 DIAGNOSIS — M2011 Hallux valgus (acquired), right foot: Secondary | ICD-10-CM

## 2021-12-16 DIAGNOSIS — Z9889 Other specified postprocedural states: Secondary | ICD-10-CM

## 2021-12-16 NOTE — Progress Notes (Signed)
She presents today for postop visit date of surgery 12/04/2021 status post Lapidus fusion with bunionectomy.  She states that she has had very little in the way of pain.  Cast has not been a problem for her.  She continues to utilize her knee scooter.  Denies fever chills nausea vomiting muscle aches pains calf pain back pain chest pain shortness of breath.  Objective: Vital signs are stable she is alert oriented x3 cast is intact dry and clean was removed demonstrates no edema no erythema cellulitis drainage or odor with the exception of the dorsal aspect of the foot which does demonstrate some mild erythema and around the suture site it appears that there was some bleeding beneath the incision so were going to opt to leave the stitches in for another couple weeks.  She has a mild extended hallux secondary to her trying to hold the toe up as she is walking.  She can get the toe to come down and with range of motion I can loosen it up and get the toe becomes down and sit straight.  Assessment: Well-healing surgical foot.  Plan: Redressed today dressed sterile compressive dressing and a dry new cast nonambulatory continue to keep dry and clean follow-up with Dr. Sherryle Lis in 2 weeks at which time he will most likely remove the cast removed sutures and then placed in a cam walker continue nonweightbearing status.

## 2021-12-30 ENCOUNTER — Ambulatory Visit (INDEPENDENT_AMBULATORY_CARE_PROVIDER_SITE_OTHER): Payer: Medicare Other | Admitting: Podiatry

## 2021-12-30 DIAGNOSIS — M2011 Hallux valgus (acquired), right foot: Secondary | ICD-10-CM

## 2021-12-30 DIAGNOSIS — Z9889 Other specified postprocedural states: Secondary | ICD-10-CM

## 2022-01-01 ENCOUNTER — Telehealth: Payer: Self-pay

## 2022-01-01 NOTE — Telephone Encounter (Signed)
Patient called and has some questions for the provider, please advise.

## 2022-01-02 ENCOUNTER — Encounter: Payer: Self-pay | Admitting: Podiatry

## 2022-01-02 NOTE — Progress Notes (Signed)
  Subjective:  Patient ID: Denise Spencer, female    DOB: 03/03/1961,  MRN: [id number]  Chief Complaint  Patient presents with   Routine Post Op    "It's doing okay."    DOS: 12/04/2021 Procedure: Lapidus bunionectomy  61 y.o. female returns for post-op check.  Overall doing well  Review of Systems: Negative except as noted in the HPI. Denies N/V/F/Ch.   Objective:  There were no vitals filed for this visit. There is no height or weight on file to calculate BMI. Constitutional Well developed. Well nourished.  Vascular Foot warm and well perfused. Capillary refill normal to all digits.  Calf is soft and supple, no posterior calf or knee pain, negative Homans' sign  Neurologic Normal speech. Oriented to person, place, and time. Epicritic sensation to light touch grossly present bilaterally.  Dermatologic Skin healing well without signs of infection. Skin edges well coapted without signs of infection.  Orthopedic: Tenderness to palpation noted about the surgical site.    Assessment:   1. Hav (hallux abducto valgus), right   2. Status post right foot surgery    Plan:  Patient was evaluated and treated and all questions answered.  S/p foot surgery right -Progressing as expected post-operatively. -WB Status: NWB in CAM boot -Sutures: All removed today. -Medications: No refills required -Foot redressed.  No follow-ups on file.

## 2022-01-05 NOTE — Telephone Encounter (Signed)
Patient notified of recommendations. 

## 2022-01-13 ENCOUNTER — Ambulatory Visit (INDEPENDENT_AMBULATORY_CARE_PROVIDER_SITE_OTHER): Payer: Medicare Other

## 2022-01-13 ENCOUNTER — Ambulatory Visit (INDEPENDENT_AMBULATORY_CARE_PROVIDER_SITE_OTHER): Payer: Medicare Other | Admitting: Podiatry

## 2022-01-13 DIAGNOSIS — M2011 Hallux valgus (acquired), right foot: Secondary | ICD-10-CM

## 2022-01-13 NOTE — Progress Notes (Signed)
She presents today with her son date of surgery 12/04/2021 states that that she is ready get out of this cast that she refers to a Lapidus procedure on the right foot.  She denies fever chills nausea vomiting muscle aches pains states has been doing just fine.  Objective: Presents today with her knee scooter nonweightbearing status.  Once removed demonstrates no erythema edema cellulitis drainage or odor dry skin is present she does have good range of motion particularly dorsiflexion plantarflexion is somewhat limited about 5 to 10 degrees of plantarflexion.  Assessment: Well-healing surgical foot.  Plan: I would like for her to follow-up with [name] in about 3 weeks for set of x-rays so we can see how the attempted fusion site is healing.  She will continue nonweightbearing status.

## 2022-01-28 ENCOUNTER — Other Ambulatory Visit: Payer: Self-pay | Admitting: Podiatry

## 2022-02-03 ENCOUNTER — Ambulatory Visit (INDEPENDENT_AMBULATORY_CARE_PROVIDER_SITE_OTHER): Payer: Medicare Other | Admitting: Podiatry

## 2022-02-03 ENCOUNTER — Encounter: Payer: Self-pay | Admitting: Podiatry

## 2022-02-03 ENCOUNTER — Ambulatory Visit (INDEPENDENT_AMBULATORY_CARE_PROVIDER_SITE_OTHER): Payer: Medicare Other

## 2022-02-03 DIAGNOSIS — M2011 Hallux valgus (acquired), right foot: Secondary | ICD-10-CM | POA: Diagnosis not present

## 2022-02-03 DIAGNOSIS — Z9889 Other specified postprocedural states: Secondary | ICD-10-CM

## 2022-02-03 NOTE — Progress Notes (Signed)
She presents today date of surgery 12/04/2021 status post Lapidus procedure bunionectomy and an Aiken osteotomy she states that is doing much better she is very happy with it thus far.  Objective: Vital signs stable alert oriented x3 scar is hypertrophic the entire length is good good dorsiflexion.  Plantarflexion is limited because of the scar tissue.  Radiographs taken today demonstrate an osseously mature foot internal fixation is intact and her osteotomies and fusion sites are healing very nicely.  Assessment: Well-healing surgical foot.  Plan: Discussed appropriate scar medication with her.  Also recommended that she start walking in the cam boot for the next couple of weeks and then try to transition to a tennis shoe that week before I see her.  I will follow-up with her in 3 weeks.  She will call with questions or concerns.

## 2022-02-15 ENCOUNTER — Encounter: Payer: Self-pay | Admitting: Podiatry

## 2022-02-23 ENCOUNTER — Encounter: Payer: Self-pay | Admitting: Podiatry

## 2022-02-24 ENCOUNTER — Encounter: Payer: Self-pay | Admitting: Podiatry

## 2022-03-01 ENCOUNTER — Ambulatory Visit (INDEPENDENT_AMBULATORY_CARE_PROVIDER_SITE_OTHER): Payer: Medicare Other | Admitting: Podiatry

## 2022-03-01 ENCOUNTER — Ambulatory Visit (INDEPENDENT_AMBULATORY_CARE_PROVIDER_SITE_OTHER): Payer: Medicare Other

## 2022-03-01 ENCOUNTER — Encounter: Payer: Self-pay | Admitting: Podiatry

## 2022-03-01 DIAGNOSIS — Z9889 Other specified postprocedural states: Secondary | ICD-10-CM

## 2022-03-01 DIAGNOSIS — M2011 Hallux valgus (acquired), right foot: Secondary | ICD-10-CM

## 2022-03-01 NOTE — Progress Notes (Signed)
She presents today for follow-up of her Lapidus fusion she states that he really is doing well and the biggest problem she has now it where she twisted her ankle in her yard by stepping on a nut.  Objective: Vital signs are stable she is alert and oriented x3 fusion site is gone on to heal uneventfully her scar is getting better with continued use of her bio oil.  She has good range of motion of the first metatarsophalangeal joint though plantarflexion somewhat limited.  She does have tenderness on palpation tibialis anterior at its insertion site but appears to be intact.  She still has some tenderness on palpation as well of the anterior talofibular ligament.  Calcaneofibular ligament posterior talofibular ligament intact.  Radiographs taken today demonstrate soft tissue swelling the anterior ankle only.  Internal fixation is intact good fusion at the first tarsometatarsal joint.  Assessment: Ankle sprain walkable  right.  Well-healing surgical Lapidus.  Follow-up with me in 6 weeks for final set of x-rays.  Dispensed compression anklet today for the swelling around the ankle.

## 2022-03-02 ENCOUNTER — Telehealth (INDEPENDENT_AMBULATORY_CARE_PROVIDER_SITE_OTHER): Payer: Medicare Other | Admitting: Psychiatry

## 2022-03-02 DIAGNOSIS — F332 Major depressive disorder, recurrent severe without psychotic features: Secondary | ICD-10-CM | POA: Diagnosis not present

## 2022-03-02 MED ORDER — NORTRIPTYLINE HCL 50 MG PO CAPS: 50.0000 mg | ORAL_CAPSULE | Freq: Every day | ORAL | 2 refills | Status: DC

## 2022-03-02 MED ORDER — SERTRALINE HCL 100 MG PO TABS: 150.0000 mg | ORAL_TABLET | Freq: Every day | ORAL | 5 refills | Status: DC

## 2022-03-02 MED ORDER — QUETIAPINE FUMARATE 100 MG PO TABS: 100.0000 mg | ORAL_TABLET | Freq: Every day | ORAL | 2 refills | Status: DC

## 2022-03-02 MED ORDER — CLONAZEPAM 1 MG PO TABS: 1.0000 mg | ORAL_TABLET | Freq: Four times a day (QID) | ORAL | 2 refills | Status: DC | PRN

## 2022-03-02 NOTE — Progress Notes (Signed)
Virtual Visit via Telephone Note  I connected with Denise Spencer on 03/02/22 at  1:40 PM EDT by telephone and verified that I am speaking with the correct person using two identifiers.  Location: Patient: Home Provider: Hospital   I discussed the limitations, risks, security and privacy concerns of performing an evaluation and management service by telephone and the availability of in person appointments. I also discussed with the patient that there may be a patient responsible charge related to this service. The patient expressed understanding and agreed to proceed.   History of Present Illness: Patient reached by telephone.  Identities established.  Patient reports she has been feeling worse for the past few months.  Multiple life stresses the most important of which is that her husband is very sick.  Multiple bouts of pneumonia from which she is not recovering well.  She is having to do most of his care at home.  Sleep is interrupted at night frequently.  Having a lot of bad dreams.  Irritable mood.  No suicidal or homicidal thoughts and no evidence of psychosis.    Observations/Objective: Alert and oriented appropriate interaction and no disorganized thinking or psychotic thinking denies suicidal or homicidal ideation   Assessment and Plan: Review medications.  She is already taking 150 mg of sertraline a day.  Recommend we leave that alone but add Seroquel 50 mg at night for about 5 to 7 days and then increase to 100 mg at night for treatment of depression and anxiety and help with sleep.  Order is completed.  Refill other medications.  Follow-up 3 months.   Follow Up Instructions:    I discussed the assessment and treatment plan with the patient. The patient was provided an opportunity to ask questions and all were answered. The patient agreed with the plan and demonstrated an understanding of the instructions.   The patient was advised to call back or seek an in-person evaluation  if the symptoms worsen or if the condition fails to improve as anticipated.  I provided 20 minutes of non-face-to-face time during this encounter.   Alethia Berthold, MD

## 2022-03-18 ENCOUNTER — Telehealth: Payer: Medicare Other | Admitting: Psychiatry

## 2022-03-21 ENCOUNTER — Encounter: Payer: Self-pay | Admitting: Podiatry

## 2022-03-23 ENCOUNTER — Telehealth: Payer: Self-pay | Admitting: Podiatry

## 2022-03-23 NOTE — Telephone Encounter (Signed)
Pt left message on general mailbox @ 1205pm stating she did get the message about getting an appt for her toe but she was exposed to covid .   I returned the call and left message for pt that I did get the message but to call the office and get scheduled for a few weeks out to see Dr Milinda Pointer.

## 2022-03-26 ENCOUNTER — Other Ambulatory Visit: Payer: Self-pay | Admitting: Podiatry

## 2022-03-26 NOTE — Telephone Encounter (Signed)
Please advise 

## 2022-04-21 ENCOUNTER — Encounter: Payer: Self-pay | Admitting: Podiatry

## 2022-04-21 ENCOUNTER — Ambulatory Visit (INDEPENDENT_AMBULATORY_CARE_PROVIDER_SITE_OTHER): Payer: Medicare Other | Admitting: Podiatry

## 2022-04-21 ENCOUNTER — Ambulatory Visit (INDEPENDENT_AMBULATORY_CARE_PROVIDER_SITE_OTHER): Payer: Medicare Other

## 2022-04-21 DIAGNOSIS — M2011 Hallux valgus (acquired), right foot: Secondary | ICD-10-CM

## 2022-04-21 DIAGNOSIS — M778 Other enthesopathies, not elsewhere classified: Secondary | ICD-10-CM

## 2022-04-21 DIAGNOSIS — Z9889 Other specified postprocedural states: Secondary | ICD-10-CM

## 2022-04-21 MED ORDER — TRIAMCINOLONE ACETONIDE 40 MG/ML IJ SUSP
20.0000 mg | Freq: Once | INTRAMUSCULAR | Status: AC
  Administered 2022-04-21: 20 mg

## 2022-04-21 NOTE — Progress Notes (Signed)
She presents today for follow-up of her Lapidus procedure-she states that is doing really well the majority of my pain is located around the second toe as she points to the second metatarsal phalangeal joint of the right foot.  Objective: Vital signs stable she alert oriented x3 complete fusion of the Lapidus with a rectus great toe.  She also has tenderness on end range of motion of the second metatarsal phalangeal joint with mild swelling.  No stress fracture identified on radiographs.  Assessment: Capsulitis of the second metatarsophalangeal joint of the right foot.  Plan: I injected around the joint today 10 mg Kenalog 5 mg Marcaine point of maximal tenderness.  Tolerated procedure well without complications.  We will follow-up with me in about 6 weeks and may need a consult for an Liane Comber and an Aiken osteotomy on the left foot.

## 2022-05-25 ENCOUNTER — Other Ambulatory Visit: Payer: Self-pay | Admitting: Podiatry

## 2022-05-27 ENCOUNTER — Other Ambulatory Visit: Payer: Self-pay | Admitting: Psychiatry

## 2022-05-27 MED ORDER — QUETIAPINE FUMARATE 100 MG PO TABS: 100.0000 mg | ORAL_TABLET | Freq: Every day | ORAL | 1 refills | Status: DC

## 2022-06-02 ENCOUNTER — Ambulatory Visit (INDEPENDENT_AMBULATORY_CARE_PROVIDER_SITE_OTHER): Payer: Medicare Other | Admitting: Podiatry

## 2022-06-02 ENCOUNTER — Ambulatory Visit: Payer: Medicare Other

## 2022-06-02 ENCOUNTER — Encounter: Payer: Self-pay | Admitting: Podiatry

## 2022-06-02 DIAGNOSIS — M778 Other enthesopathies, not elsewhere classified: Secondary | ICD-10-CM

## 2022-06-02 DIAGNOSIS — M2010 Hallux valgus (acquired), unspecified foot: Secondary | ICD-10-CM

## 2022-06-02 MED ORDER — DEXAMETHASONE SODIUM PHOSPHATE 120 MG/30ML IJ SOLN
2.0000 mg | Freq: Once | INTRAMUSCULAR | Status: AC
  Administered 2022-06-02: 2 mg via INTRA_ARTICULAR

## 2022-06-02 NOTE — Progress Notes (Signed)
She presents today for follow-up of her capsulitis of her right foot.  She would like to discuss surgery on the left foot.  She states the injection really helped on the right foot but there is still some soreness on the bottom side.  Objective: Vital signs are stable alert oriented x 3 left foot does demonstrate a much more mild bunion deformity with hallux interphalangeal left.  The right foot still demonstrates some tenderness on palpation and end range of motion of the second metatarsophalangeal joint of the right foot most likely compensatory due to the Lapidus procedure that was performed.  Assessment: Capsulitis second metatarsophalangeal joint right.  Left foot demonstrates mild bunion deformity.  Plan: Discussed etiology pathology conservative surgical therapies at this point she is moving we will not be able to have surgery performed until either February or March so at this point no consult for the left foot till next visit.  However I did inject 2 mg of dexamethasone local anesthetic to the plantar aspect of the second metatarsal phalangeal joint hopefully this will alleviate her symptoms.  Did not inject into the tendons or the joint itself.

## 2022-07-12 ENCOUNTER — Ambulatory Visit (INDEPENDENT_AMBULATORY_CARE_PROVIDER_SITE_OTHER): Payer: 59 | Admitting: Plastic Surgery

## 2022-07-12 ENCOUNTER — Encounter: Payer: Self-pay | Admitting: Plastic Surgery

## 2022-07-12 VITALS — BP 128/85 | HR 105 | Ht 67.0 in | Wt 196.4 lb

## 2022-07-12 DIAGNOSIS — N62 Hypertrophy of breast: Secondary | ICD-10-CM

## 2022-07-12 DIAGNOSIS — M549 Dorsalgia, unspecified: Secondary | ICD-10-CM | POA: Diagnosis not present

## 2022-07-12 DIAGNOSIS — F17210 Nicotine dependence, cigarettes, uncomplicated: Secondary | ICD-10-CM

## 2022-07-12 NOTE — Progress Notes (Signed)
Referring Provider [name], MD [phone] [facility] [place],  [place] [address]   CC:  Chief Complaint  Patient presents with   Advice Only      Denise Spencer is an 63 y.o. female.  HPI: Denise Spencer is a very pleasant 62 year old female who presents today with complaints of back pain.  She feels and has been told by her primary care doctor that her back pain is probably due to the large size of her breast.  She is requesting surgical reduction of her breast.  She does have known disc disease which may be the cause for her back pain.  Additionally she is a current smoker.  She also has a history of COPD.  This is well-controlled per the patient.  Allergies  Allergen Reactions   Advair Diskus [Fluticasone-Salmeterol]     Caused thrush   Amoxicillin    Aspirin     Muscle and joint pain   Breo Ellipta [Fluticasone Furoate-Vilanterol]     Caused thrush   Meloxicam     Other reaction(s): Not available   Naproxen     Other reaction(s): Not available   Nsaids Nausea And Vomiting   Tape     Some adhesives cause blisters.  Tegaderm is OK.   Doxycycline Rash   Erythromycin Rash   Penicillins Rash   Sulfa Antibiotics Rash         Outpatient Encounter Medications as of 07/12/2022  Medication Sig Note   clonazePAM (KLONOPIN) 1 MG tablet Take 1 tablet (1 mg total) by mouth 4 (four) times daily as needed.    gabapentin (NEURONTIN) 300 MG capsule Take 1,200 mg by mouth 3 (three) times daily. 07/12/2022: Pt takes 2400 mg 4 times daily.    nortriptyline (PAMELOR) 50 MG capsule Take 1 capsule (50 mg total) by mouth at bedtime.    pantoprazole (PROTONIX) 40 MG tablet Take 40 mg by mouth daily.    QUEtiapine (SEROQUEL) 100 MG tablet Take 1 tablet (100 mg total) by mouth at bedtime. 1/2 pill at night for first 7 days then increase to whole pill at night    sertraline (ZOLOFT) 100 MG tablet Take 1.5 tablets (150 mg total) by mouth daily.     simvastatin (ZOCOR) 10 MG tablet Take 10 mg by mouth daily.    tiZANidine (ZANAFLEX) 4 MG tablet Take 4 mg by mouth 4 (four) times daily as needed for muscle spasms.    traMADol (ULTRAM) 50 MG tablet Take 50 mg by mouth 3 (three) times daily as needed for moderate pain.    [DISCONTINUED] Vitamin D, Ergocalciferol, (DRISDOL) 1.25 MG (50000 UNIT) CAPS capsule TAKE 1 CAPSULE BY MOUTH EVERY 7 DAYS    No facility-administered encounter medications on file as of 07/12/2022.     Past Medical History:  Diagnosis Date   Anxiety    Arthritis    knees, lower back   Bipolar disorder ([place])    Bulging lumbar disc    L5-S1   COPD (chronic obstructive pulmonary disease) (HCC)    MILD   Depression    Dyspnea    Fibromyalgia    GERD (gastroesophageal reflux disease)    PONV (postoperative nausea and vomiting)    with hysterectomy only   Sleep apnea    CPAP   Wears dentures    full upper and lower    Past Surgical History:  Procedure Laterality Date   ABDOMINAL HYSTERECTOMY  ABDOMINAL HYSTERECTOMY W/ PARTIAL VAGINACTOMY     CARDIAC CATHETERIZATION  2012   CESAREAN SECTION     COLONOSCOPY     COLONOSCOPY WITH PROPOFOL N/A 11/01/2016   Procedure: COLONOSCOPY WITH PROPOFOL;  Surgeon: [name], MD;  Location: [facility];  Service: Endoscopy;  Laterality: N/A;   COLONOSCOPY WITH PROPOFOL N/A 05/22/2021   Procedure: COLONOSCOPY WITH BIOPSY;  Surgeon: [name], MD;  Location: [facility];  Service: Endoscopy;  Laterality: N/A;   ESOPHAGOGASTRODUODENOSCOPY (EGD) WITH PROPOFOL N/A 11/01/2016   Procedure: ESOPHAGOGASTRODUODENOSCOPY (EGD) WITH PROPOFOL;  Surgeon: [name], MD;  Location: [facility];  Service: Endoscopy;  Laterality: N/A;   HIP ARTHROPLASTY Left 04/2020   POLYPECTOMY  11/01/2016   Procedure: POLYPECTOMY;  Surgeon: [name], MD;  Location: [facility];  Service: Endoscopy;;   POLYPECTOMY N/A 05/22/2021   Procedure: POLYPECTOMY;   Surgeon: [name], MD;  Location: [facility];  Service: Endoscopy;  Laterality: N/A;   SEPTOPLASTY Left 12/27/2016   Procedure: SEPTOPLASTY,ENDOSCOPIC TRIMMING MIDDLE TURBINATE;  Surgeon: Margaretha Sheffield, MD;  Location: [facility];  Service: ENT;  Laterality: Left;   WRIST GANGLION EXCISION      Family History  Problem Relation Age of Onset   Breast cancer Mother 2   Bipolar disorder Mother    Depression Mother    Anxiety disorder Mother    Heart attack Father    Thyroid cancer Father    Hypertension Father    Depression Sister    Multiple sclerosis Sister    Bipolar disorder Sister    Hypertension Sister    Bipolar disorder Sister    Lymphoma Sister    Anxiety disorder Sister    Bipolar disorder Sister    Multiple sclerosis Other     Social History   Social History Narrative   Lives at home with husband and adult son.  Pt is disabled.  Has 2 children.  Education: 12 th grade.      Review of Systems General: Denies fevers, chills, weight loss CV: Denies chest pain, shortness of breath, palpitations Breast: The patient denies any personal breast cancer history.  She does have a mother that had breast cancer.  She feels that the large size of her breast is contributing to her upper back and neck pain.  Physical Exam    07/12/2022   10:11 AM 12/09/2021    8:50 AM 12/07/2021   10:00 AM  Vitals with BMI  Height '5\' 7"'$     Weight 196 lbs 6 oz    BMI 51.88    Systolic 416 606 301  Diastolic 85 601 74  Pulse 105 106 101    General:  No acute distress,  Alert and oriented, Non-Toxic, Normal speech and affect Breast: Large ptotic breasts with grade 2 ptosis.  No obvious masses and no nipple abnormalities. Mammogram: Last mammogram was in 2022.  Patient states that she has an appointment to have a mammogram. Assessment/Plan Symptomatic macromastia:I believe I can remove 400 gms per breast.  The patient is at a greater risk for needing a free nipple graft due to the  large fold to nipple distance.  In addition to this she is a smoker.  Because of her nipple loss risk I have not offered her surgery at this time.  She will return in 4 months after she has stopped smoking and we will schedule surgery at that time provided she is successful with her smoking cessation.  We did discuss the procedure  at length including the location of the incisions and the unpredictable nature of scarring.  We discussed the risks of bleeding, infection, and seroma formation.  Again we did discuss the high risk of nipple loss in her case.  She understands the postoperative restrictions include no heavy lifting greater than 20 pounds, no vigorous activity, and no submerging the incisions in water.  While she is attempting to stop smoking I have encouraged her to increase her walking and to add some resistance training just for overall physical conditioning.  Will schedule for surgery in 4 months when she returns.  Camillia Herter 07/12/2022, 11:01 AM

## 2022-07-13 ENCOUNTER — Telehealth: Payer: Self-pay | Admitting: Plastic Surgery

## 2022-07-13 ENCOUNTER — Telehealth: Payer: Self-pay | Admitting: *Deleted

## 2022-07-13 NOTE — Telephone Encounter (Signed)
Pt called to cancel appt with provider stating she has changed her mind about surgery.

## 2022-07-13 NOTE — Telephone Encounter (Signed)
Informed by Claiborne Billings that pt called and canceled f/u appt stating she had changed her mind about surgery. Chart removed from new routes and Dr. Lovena Le made aware

## 2022-08-03 ENCOUNTER — Encounter: Payer: Self-pay | Admitting: Podiatry

## 2022-08-03 ENCOUNTER — Ambulatory Visit (INDEPENDENT_AMBULATORY_CARE_PROVIDER_SITE_OTHER): Payer: 59 | Admitting: Podiatry

## 2022-08-03 ENCOUNTER — Ambulatory Visit (INDEPENDENT_AMBULATORY_CARE_PROVIDER_SITE_OTHER): Payer: 59

## 2022-08-03 DIAGNOSIS — M2012 Hallux valgus (acquired), left foot: Secondary | ICD-10-CM | POA: Diagnosis not present

## 2022-08-03 NOTE — Progress Notes (Signed)
She presents today chief complaint of a painful left foot.  She states that I am ready to get this thing fixed to have the right 1 fixed and this point starting to affect my ability to perform her daily activities.  She is referring to the bunion deformity of her left foot.  She states there is been no changes in her past medical history medications allergies surgeries and social history she is no longer going to be living with her sister they will be moving back to their home in [place].  Objective: Vital signs are stable oriented x 3 pulses are palpable.  There is no erythema edema cellulitis drainage or odor she does have a very prominent first metatarsal head with hallux valgus deformity she also has interphalangeal joint deformity as well.  She has mild limitation on range of motion of the first metatarsophalangeal joint.  She also has tenderness on palpation and end range of motion of the second metatarsophalangeal joint.  Radiographs taken today demonstrate an osseously mature individual with an increase in the first intermetatarsal angle greater than 13 degrees.  Of the interphalangeus measures approximately 30 degrees of lateral deviation.  Some joint space narrowing is noted hypertrophic medial condyle is noted.  Plantarflexed elongated second metatarsal is noted.  Assessment: Pain in limb secondary to hallux abductovalgus deformity of the left foot and hallux interphalangeal.  Plantarflexed second metatarsal is also prominent and painful.  Plan: Discussed etiology pathology conservative versus surgical therapies at this point were going to consent her for an [facility] bunion repair with an Barbie Banner osteotomy also we are going to consent her for second metatarsal osteotomy.  She understands this and is amenable to it she has had the other foot done so she knows of possible side effects and complications which we went over once again today.  She will have this procedure performed [facility] I will follow-up with her at that time.

## 2022-08-04 ENCOUNTER — Telehealth: Payer: Self-pay | Admitting: Urology

## 2022-08-04 NOTE — Telephone Encounter (Signed)
DOS - 08/27/22  DOUBLE OSTEOTOMY LEFT --- 28299 METATARSAL OSTEOTOMY 2ND LEFT --- 28308  [facility] EFFECTIVE DATE - 06/21/22  PLAN DEDUCTIBLE - $240.00  W/ $0.00 REMAINING  OUT OF POCKET - $8,850.00 W/ $8,580.26 REMAINING COINSURANCE - 20%  PER [employer] WEBSITE FOR CPT CODES 29518 AND 84166 Notification or Prior Authorization is not required for the requested services   Decision ID #: [id number]

## 2022-08-05 ENCOUNTER — Encounter: Payer: Self-pay | Admitting: Podiatry

## 2022-08-17 ENCOUNTER — Encounter: Payer: Self-pay | Admitting: Podiatry

## 2022-08-19 ENCOUNTER — Telehealth (INDEPENDENT_AMBULATORY_CARE_PROVIDER_SITE_OTHER): Payer: 59 | Admitting: Psychiatry

## 2022-08-19 DIAGNOSIS — F332 Major depressive disorder, recurrent severe without psychotic features: Secondary | ICD-10-CM | POA: Diagnosis not present

## 2022-08-19 MED ORDER — NORTRIPTYLINE HCL 50 MG PO CAPS: 50.0000 mg | ORAL_CAPSULE | Freq: Every day | ORAL | 1 refills | Status: DC

## 2022-08-19 MED ORDER — SERTRALINE HCL 100 MG PO TABS: 150.0000 mg | ORAL_TABLET | Freq: Every day | ORAL | 1 refills | Status: DC

## 2022-08-19 MED ORDER — CLONAZEPAM 1 MG PO TABS: 1.0000 mg | ORAL_TABLET | Freq: Four times a day (QID) | ORAL | 1 refills | Status: DC | PRN

## 2022-08-19 MED ORDER — QUETIAPINE FUMARATE 100 MG PO TABS: 100.0000 mg | ORAL_TABLET | Freq: Every day | ORAL | 1 refills | Status: DC

## 2022-08-19 NOTE — Progress Notes (Signed)
Virtual Visit via Telephone Note  I connected with Denise Spencer on 08/19/22 at  1:00 PM EST by telephone and verified that I am speaking with the correct person using two identifiers.  Location: Patient: Home Provider: Hospital   I discussed the limitations, risks, security and privacy concerns of performing an evaluation and management service by telephone and the availability of in person appointments. I also discussed with the patient that there may be a patient responsible charge related to this service. The patient expressed understanding and agreed to proceed.   History of Present Illness: Patient reached by telephone.  Patient reports mood and anxiety much improved.  Her husband's health has improved and that was her major stress previously.  He seems to be getting better and this means less burden on her and less worry.  Sleeping okay.  Mood is improved.  No suicidal thoughts.  Tolerating medicine well.    Observations/Objective: Alert and oriented.  Affect euthymic mood stated as better.  Thoughts lucid no loosening of associations no disorganized thinking denies suicidal thoughts   Assessment and Plan: Stable doing quite well.  All medicines refilled we can follow-up in 6 months.   Follow Up Instructions:    I discussed the assessment and treatment plan with the patient. The patient was provided an opportunity to ask questions and all were answered. The patient agreed with the plan and demonstrated an understanding of the instructions.   The patient was advised to call back or seek an in-person evaluation if the symptoms worsen or if the condition fails to improve as anticipated.  I provided 20 minutes of non-face-to-face time during this encounter.   Alethia Berthold, MD

## 2022-08-26 ENCOUNTER — Other Ambulatory Visit: Payer: Self-pay | Admitting: Podiatry

## 2022-08-26 MED ORDER — ONDANSETRON HCL 4 MG PO TABS: 4.0000 mg | ORAL_TABLET | Freq: Three times a day (TID) | ORAL | 0 refills | Status: DC | PRN

## 2022-08-26 MED ORDER — OXYCODONE-ACETAMINOPHEN 10-325 MG PO TABS: 1.0000 | ORAL_TABLET | Freq: Three times a day (TID) | ORAL | 0 refills | Status: AC | PRN

## 2022-08-26 MED ORDER — CLINDAMYCIN HCL 150 MG PO CAPS: 150.0000 mg | ORAL_CAPSULE | Freq: Three times a day (TID) | ORAL | 0 refills | Status: DC

## 2022-08-27 DIAGNOSIS — M2012 Hallux valgus (acquired), left foot: Secondary | ICD-10-CM | POA: Diagnosis not present

## 2022-08-27 DIAGNOSIS — M21542 Acquired clubfoot, left foot: Secondary | ICD-10-CM | POA: Diagnosis not present

## 2022-09-01 ENCOUNTER — Ambulatory Visit (INDEPENDENT_AMBULATORY_CARE_PROVIDER_SITE_OTHER): Payer: 59

## 2022-09-01 ENCOUNTER — Ambulatory Visit (INDEPENDENT_AMBULATORY_CARE_PROVIDER_SITE_OTHER): Payer: 59 | Admitting: Podiatry

## 2022-09-01 DIAGNOSIS — M2012 Hallux valgus (acquired), left foot: Secondary | ICD-10-CM

## 2022-09-01 DIAGNOSIS — Z9889 Other specified postprocedural states: Secondary | ICD-10-CM

## 2022-09-01 NOTE — Progress Notes (Signed)
She presents today for her first postop visit Martha Clan osteotomy and second metatarsal osteotomy.  She is denies fever chills nausea vomit muscle aches and pains states that the foot is been painful.  Objective: Vital signs are stable she is alert and oriented x 3.  Pulses are palpable sterile dressing intact once removed demonstrates a largely erythematous inflamed forefoot.  Does not demonstrate any signs of infection.  Sutures are intact margins are well coapted she has good range of motion though tender on palpation and range of motion of the first metatarsal phalangeal joint and second metatarsal phalangeal joint.  Radiographs taken today demonstrate Barbie Banner is intact Austin type osteotomy and screws intact and Weil type osteotomy is intact with 2 screws.  Assessment: Well-healing surgical foot.  Plan: Redressed today dressed a compressive dressing encouraged her to continue to ice the foot stay off of it is much as possible and I will follow-up with her in 1 week

## 2022-09-08 ENCOUNTER — Encounter: Payer: Self-pay | Admitting: Podiatry

## 2022-09-08 ENCOUNTER — Other Ambulatory Visit: Payer: Self-pay | Admitting: Podiatry

## 2022-09-08 MED ORDER — OXYCODONE-ACETAMINOPHEN 10-325 MG PO TABS: 1.0000 | ORAL_TABLET | Freq: Three times a day (TID) | ORAL | 0 refills | Status: AC | PRN

## 2022-09-14 ENCOUNTER — Ambulatory Visit (INDEPENDENT_AMBULATORY_CARE_PROVIDER_SITE_OTHER): Payer: 59 | Admitting: Podiatry

## 2022-09-14 ENCOUNTER — Encounter: Payer: Self-pay | Admitting: Podiatry

## 2022-09-14 VITALS — BP 108/74 | HR 78

## 2022-09-14 DIAGNOSIS — M2012 Hallux valgus (acquired), left foot: Secondary | ICD-10-CM

## 2022-09-14 DIAGNOSIS — Z9889 Other specified postprocedural states: Secondary | ICD-10-CM

## 2022-09-14 NOTE — Progress Notes (Signed)
She presents today for her first postop visit Martha Clan osteotomy and second metatarsal osteotomy.  She is denies fever chills nausea vomit muscle aches and pains states that the foot is been painful.  Objective: Vital signs are stable she is alert and oriented x 3.  Pulses are palpable sterile dressing intact once removed demonstrates a largely erythematous inflamed forefoot.  Does not demonstrate any signs of infection.  Sutures are intact margins are well coapted she has good range of motion though tender on palpation and range of motion of the first metatarsal phalangeal joint and second metatarsal phalangeal joint.  Radiographs taken today demonstrate Barbie Banner is intact Austin type osteotomy and screws intact and Weil type osteotomy is intact with 2 screws.  Assessment: Well-healing surgical foot.  Plan: Redressed today dressed a compressive dressing.  Encouraged her to continue wearing her boot.  Given that there is maceration present on the stitches and appears to be slightly gapping I will leave the stitches in for 1 more week.  She will have again taken out next week when she sees Dr. Milinda Pointer

## 2022-09-16 ENCOUNTER — Other Ambulatory Visit: Payer: Self-pay | Admitting: Podiatry

## 2022-09-16 ENCOUNTER — Telehealth: Payer: Self-pay | Admitting: *Deleted

## 2022-09-16 MED ORDER — OXYCODONE-ACETAMINOPHEN 10-325 MG PO TABS: 1.0000 | ORAL_TABLET | Freq: Three times a day (TID) | ORAL | 0 refills | Status: AC | PRN

## 2022-09-16 NOTE — Telephone Encounter (Signed)
Patient has been updated.

## 2022-09-16 NOTE — Telephone Encounter (Signed)
Patient is calling for a refill of pain medicine(oxy-ace,10/325 mg),please advise.

## 2022-09-16 NOTE — Telephone Encounter (Signed)
Refill sent. Thanks

## 2022-09-20 ENCOUNTER — Ambulatory Visit (INDEPENDENT_AMBULATORY_CARE_PROVIDER_SITE_OTHER): Payer: 59 | Admitting: Podiatry

## 2022-09-20 ENCOUNTER — Ambulatory Visit (INDEPENDENT_AMBULATORY_CARE_PROVIDER_SITE_OTHER): Payer: 59

## 2022-09-20 DIAGNOSIS — M2012 Hallux valgus (acquired), left foot: Secondary | ICD-10-CM

## 2022-09-20 DIAGNOSIS — Z9889 Other specified postprocedural states: Secondary | ICD-10-CM

## 2022-09-20 NOTE — Progress Notes (Signed)
  Subjective:  Patient ID: Denise Spencer, female    DOB: 22-May-1961,  MRN: [id number]  Chief Complaint  Patient presents with   Routine Post Op    POV #3 DOS 08/27/22 --- Martha Clan OSTEOTOMY (BUNION REPAIR)LEFT 2ND METATARSAL OSTEOTOMY WITH SCREWS/ possible stitches removal      62 y.o. female returns for post-op check.  Says it is doing little bit better  Review of Systems: Negative except as noted in the HPI. Denies N/V/F/Ch.   Objective:  There were no vitals filed for this visit. There is no height or weight on file to calculate BMI. Constitutional Well developed. Well nourished.  Vascular Foot warm and well perfused. Capillary refill normal to all digits.  Calf is soft and supple, no posterior calf or knee pain, negative Homans' sign  Neurologic Normal speech. Oriented to person, place, and time. Epicritic sensation to light touch grossly present bilaterally.  Dermatologic Weil incision is well-healed and not hypertrophic some scab present, large scab present over first MPJ incision, no signs of infection drainage cellulitis at either site  Orthopedic: Tenderness to palpation noted about the surgical site.   Multiple view plain film radiographs: left foot radiographs show well positioned hardware with no complication, still persistent lucency at Akin site as expected Assessment:   1. Hav (hallux abducto valgus), left   2. S/P foot surgery, left    Plan:  Patient was evaluated and treated and all questions answered.  S/p foot surgery left -Progressing as expected post-operatively. -Does have some delayed healing of first MPJ incision.  Currently she says she is not smoking.  We discussed the impact of her smoking history on wound healing -No signs of infection currently.  Maceration has improved.  I recommended she begin showering, no soaking or scrubbing and apply mupirocin daily.  She will bandage if there is open wound or drainage -She will follow-up in 9 days for  reevaluation  Return in about 9 days (around 09/29/2022) for post op (no x-rays).

## 2022-09-21 ENCOUNTER — Telehealth: Payer: Self-pay | Admitting: Podiatry

## 2022-09-21 ENCOUNTER — Telehealth: Payer: Self-pay | Admitting: *Deleted

## 2022-09-21 MED ORDER — MUPIROCIN 2 % EX OINT: 1.0000 | TOPICAL_OINTMENT | Freq: Two times a day (BID) | CUTANEOUS | 2 refills | Status: DC

## 2022-09-21 NOTE — Telephone Encounter (Signed)
Patient is calling because she stepped on a rock after visit on yesterday and possibly twisted her left ankle, iced and this morning is still very painful, has an upcoming appointment on 04/10,will be needing a sooner appointment even if it is with another physician.

## 2022-09-21 NOTE — Telephone Encounter (Signed)
Pt states she was told that Dr Sherryle Lis was to send in medication (unknown) to pharmacy. Pt went to pharmacy and no medication is on file.  Gregory [id number] - [place], [place]   Please advise.

## 2022-09-21 NOTE — Addendum Note (Signed)
Addended bySherryle Lis, Deborah Dondero R on: 09/21/2022 04:59 PM   Modules accepted: Orders

## 2022-09-22 ENCOUNTER — Telehealth: Payer: Self-pay | Admitting: *Deleted

## 2022-09-22 ENCOUNTER — Ambulatory Visit (INDEPENDENT_AMBULATORY_CARE_PROVIDER_SITE_OTHER): Payer: 59 | Admitting: Podiatry

## 2022-09-22 ENCOUNTER — Ambulatory Visit (INDEPENDENT_AMBULATORY_CARE_PROVIDER_SITE_OTHER): Payer: 59

## 2022-09-22 DIAGNOSIS — S93402A Sprain of unspecified ligament of left ankle, initial encounter: Secondary | ICD-10-CM | POA: Diagnosis not present

## 2022-09-22 MED ORDER — OXYCODONE-ACETAMINOPHEN 10-325 MG PO TABS: 1.0000 | ORAL_TABLET | Freq: Three times a day (TID) | ORAL | 0 refills | Status: AC | PRN

## 2022-09-22 NOTE — Telephone Encounter (Signed)
Patient has been scheduled for upcoming appointment today concerning her injuring to ankle.

## 2022-09-27 ENCOUNTER — Encounter: Payer: Self-pay | Admitting: Podiatry

## 2022-09-27 ENCOUNTER — Ambulatory Visit (INDEPENDENT_AMBULATORY_CARE_PROVIDER_SITE_OTHER): Payer: 59 | Admitting: Podiatry

## 2022-09-27 DIAGNOSIS — L97512 Non-pressure chronic ulcer of other part of right foot with fat layer exposed: Secondary | ICD-10-CM

## 2022-09-27 MED ORDER — COLLAGENASE 250 UNIT/GM EX OINT: 1.0000 | TOPICAL_OINTMENT | Freq: Every day | CUTANEOUS | 0 refills | Status: DC

## 2022-09-27 NOTE — Progress Notes (Signed)
  Subjective:  Patient ID: Denise Spencer, female    DOB: February 21, 1961,  MRN: [id number]  Chief Complaint  Patient presents with   Ankle Injury    Pt twisted her left ankle.Needs X-ray - she thinks she may have hit her toes on the ground - very painful across top  of foot      62 y.o. female returns for post-op check.  She rolled her ankle.  Has had pain and swelling on the side.  She went to make sure she had bumped the toes on the left side  Review of Systems: Negative except as noted in the HPI. Denies N/V/F/Ch.   Objective:  There were no vitals filed for this visit. There is no height or weight on file to calculate BMI. Constitutional Well developed. Well nourished.  Vascular Foot warm and well perfused. Capillary refill normal to all digits.  Calf is soft and supple, no posterior calf or knee pain, negative Homans' sign  Neurologic Normal speech. Oriented to person, place, and time. Epicritic sensation to light touch grossly present bilaterally.  Dermatologic Weil incision is well-healed and not hypertrophic some scab present, large scab present over first MPJ incision, no signs of infection drainage cellulitis at either site  Orthopedic: Tenderness to palpation noted about the surgical site.  No change in position.  She has tenderness and swelling around the lateral ankle.   Multiple view plain film radiographs: New films taken today do not show any complication of hardware or position of fracture about the ankle Assessment:   1. Sprain of left ankle, unspecified ligament, initial encounter    Plan:  Patient was evaluated and treated and all questions answered.  S/p foot surgery left -Thankfully has not had any injury.  She does have a mild ankle sprain, we reviewed RICE protocol.  Continue ice and elevation as needed.  Refill of pain medication sent to pharmacy.  Follow-up as scheduled.  No follow-ups on file.

## 2022-09-28 NOTE — Progress Notes (Signed)
  Subjective:  Patient ID: Denise Spencer, female    DOB: [birth year],  MRN: [id number]  Chief Complaint  Patient presents with   Routine Post Op    "The swelling is fantastic but the toes are very sensitive."      62 y.o. female returns for post-op check.  Ankle continues to improve.  Review of Systems: Negative except as noted in the HPI. Denies N/V/F/Ch.   Objective:  There were no vitals filed for this visit. There is no height or weight on file to calculate BMI. Constitutional Well developed. Well nourished.  Vascular Foot warm and well perfused. Capillary refill normal to all digits.  Calf is soft and supple, no posterior calf or knee pain, negative Homans' sign  Neurologic Normal speech. Oriented to person, place, and time. Epicritic sensation to light touch grossly present bilaterally.  Dermatologic Weil incision is well-healed and not hypertrophic, persistent delayed healing and scab and eschar present over first MTPJ incision  Orthopedic: Minimal pain on the ankle today   Multiple view plain film radiographs: New films taken today do not show any complication of hardware or position of fracture about the ankle Assessment:   1. Ulcer of right foot with fat layer exposed    Plan:  Patient was evaluated and treated and all questions answered.  S/p foot surgery left -Removed as much suture as possible from the first MPJ incision.  We discussed that she does have delayed healing likely secondary to her smoking which we have recommended her stopping.  Discussed further wound care for this I expect this likely will need collagenase to enzymatically debride the overlying eschar and delayed healing.  Rx sent to pharmacy.  Wound measurements 4.5 cm x 0.8 cm.  Currently no signs of active infection.  She will return next week as scheduled to see Dr. Al Corpus.  We discussed the application of Santyl and how to apply it with a saline wet-to-dry dressing.  I demonstrated this for her  in the office today.  No follow-ups on file.

## 2022-09-29 ENCOUNTER — Other Ambulatory Visit: Payer: Self-pay | Admitting: Podiatry

## 2022-09-29 ENCOUNTER — Encounter: Payer: 59 | Admitting: Podiatry

## 2022-10-01 ENCOUNTER — Encounter: Payer: Self-pay | Admitting: Podiatry

## 2022-10-06 ENCOUNTER — Ambulatory Visit (INDEPENDENT_AMBULATORY_CARE_PROVIDER_SITE_OTHER): Payer: 59 | Admitting: Podiatry

## 2022-10-06 ENCOUNTER — Ambulatory Visit (INDEPENDENT_AMBULATORY_CARE_PROVIDER_SITE_OTHER): Payer: 59

## 2022-10-06 ENCOUNTER — Telehealth: Payer: Self-pay | Admitting: Podiatry

## 2022-10-06 ENCOUNTER — Encounter: Payer: Self-pay | Admitting: Podiatry

## 2022-10-06 DIAGNOSIS — M2012 Hallux valgus (acquired), left foot: Secondary | ICD-10-CM

## 2022-10-06 DIAGNOSIS — L97512 Non-pressure chronic ulcer of other part of right foot with fat layer exposed: Secondary | ICD-10-CM

## 2022-10-06 DIAGNOSIS — Z9889 Other specified postprocedural states: Secondary | ICD-10-CM

## 2022-10-06 NOTE — Telephone Encounter (Signed)
Pt called and forgot to ask you about how much should she stay off her foot, Should it be as much as possible?

## 2022-10-06 NOTE — Progress Notes (Signed)
She presents today for another postop visit #5 date of surgery 08/27/2022 status post [facility] osteotomy and a second metatarsal osteotomy with screws.  States that it really been doing fine but has been sore can continuing to use the Santyl cream daily.  Objective: Vital signs are stable alert and oriented x 3.  Pulses are palpable.  The exposed dehisced incision site still retains the sutures but it appears to be healing very nicely it has lightened considerably and the eschar is becoming much less stiff.  She has good range of motion of the toe.  Radiographic evaluation of the toe demonstrates well-healing Aiken osteotomy as well as an Sports coach.  Assessment: Well-healing surgical foot status post Eliberto Ivory and an [place].  Delayed union with dehiscence of the surgical wound but appears to be progressing well.  Plan: Currently she is going continue current daily dressing changes and we will follow-up with her in 2 weeks surgery boot will still be worn daily.

## 2022-10-07 NOTE — Telephone Encounter (Signed)
Notified pt that she should stay off foot as much as possible. She said thank you.

## 2022-10-12 ENCOUNTER — Encounter: Payer: Self-pay | Admitting: Podiatry

## 2022-10-19 ENCOUNTER — Telehealth (INDEPENDENT_AMBULATORY_CARE_PROVIDER_SITE_OTHER): Payer: 59 | Admitting: Psychiatry

## 2022-10-19 DIAGNOSIS — F332 Major depressive disorder, recurrent severe without psychotic features: Secondary | ICD-10-CM

## 2022-10-19 MED ORDER — NORTRIPTYLINE HCL 50 MG PO CAPS: 50.0000 mg | ORAL_CAPSULE | Freq: Every day | ORAL | 1 refills | Status: DC

## 2022-10-19 MED ORDER — QUETIAPINE FUMARATE 100 MG PO TABS: 100.0000 mg | ORAL_TABLET | Freq: Every day | ORAL | 1 refills | Status: DC

## 2022-10-19 MED ORDER — SERTRALINE HCL 100 MG PO TABS: 150.0000 mg | ORAL_TABLET | Freq: Every day | ORAL | 1 refills | Status: AC

## 2022-10-19 MED ORDER — CLONAZEPAM 1 MG PO TABS: 1.0000 mg | ORAL_TABLET | Freq: Four times a day (QID) | ORAL | 1 refills | Status: AC | PRN

## 2022-10-19 NOTE — Progress Notes (Signed)
Virtual Visit via Telephone Note  I connected with Denise Spencer on 10/19/22 at  1:00 PM EDT by telephone and verified that I am speaking with the correct person using two identifiers.  Location: Patient: Home Provider: Hospital   I discussed the limitations, risks, security and privacy concerns of performing an evaluation and management service by telephone and the availability of in person appointments. I also discussed with the patient that there may be a patient responsible charge related to this service. The patient expressed understanding and agreed to proceed.   History of Present Illness: Patient reached by telephone.  62 year old woman with longstanding depression and anxiety currently very stable.  Patient has no new complaints.  Mood has been stable despite having some medical issues and some problems related to her husband's health.  She feels that overall she is doing quite well.  Patient is aware that I will be leaving the practice at the end of June and has already worked on making an appointment with another provider in our office.  This is a 62 year old woman who I have been seeing for medication management for many years who has been extremely stable on medication without any problems of medicine abuse.    Observations/Objective: Patient was alert and oriented.  Appropriate affect.  Mood stable.  No thought disorder no suicidal ideation and no complaints of side effects   Assessment and Plan: Reviewed all medications including the clonazepam that she is on as needed.  Patient is aware that providers in our office may not be willing to provide benzodiazepines.  She will be included in those to whom I mail a list of possible alternate providers.  I will provide 6 months worth of medication at this time.  She is aware I will be here until the end of June.  Follow Up Instructions:    I discussed the assessment and treatment plan with the patient. The patient was provided an  opportunity to ask questions and all were answered. The patient agreed with the plan and demonstrated an understanding of the instructions.   The patient was advised to call back or seek an in-person evaluation if the symptoms worsen or if the condition fails to improve as anticipated.  I provided 20 minutes of non-face-to-face time during this encounter.   Mordecai Rasmussen, MD

## 2022-10-25 ENCOUNTER — Ambulatory Visit (INDEPENDENT_AMBULATORY_CARE_PROVIDER_SITE_OTHER): Payer: 59 | Admitting: Podiatry

## 2022-10-25 ENCOUNTER — Encounter: Payer: Self-pay | Admitting: Podiatry

## 2022-10-25 ENCOUNTER — Ambulatory Visit (INDEPENDENT_AMBULATORY_CARE_PROVIDER_SITE_OTHER): Payer: 59

## 2022-10-25 ENCOUNTER — Encounter (HOSPITAL_COMMUNITY): Payer: Self-pay

## 2022-10-25 ENCOUNTER — Encounter: Payer: 59 | Admitting: Podiatry

## 2022-10-25 DIAGNOSIS — M2012 Hallux valgus (acquired), left foot: Secondary | ICD-10-CM

## 2022-10-25 DIAGNOSIS — Z9889 Other specified postprocedural states: Secondary | ICD-10-CM

## 2022-10-25 NOTE — Progress Notes (Signed)
She presents today for follow-up of her Minta Balsam osteotomy.  She states that has been hurting a lot lately but she has been on it a lot.  Date of surgery was 08/27/2022 so she is coming up on 2 months at this point.  She states that her smoking cessation is still going well.  She continues to her medications to her foot on a daily basis.  Objective: Vital signs stable alert and oriented x 3.  Pulses are palpable.  She has a stiffness of the joint with postinflammatory hyperpigmentation.  The wound is healing very nicely had a considerable amount of soft scab on it today which I resected the majority of.  After motion of the joint has begun the joint will move fairly freely.  Radiographically she still has there.  Delayed union of the proximal phalanx status post Aiken osteotomy.  Internal fixation is intact.  Assessment: Well-healing surgical foot with some delayed wound healing and some delayed healing of the bone secondary to smoking most likely.  Plan: Continue all conservative therapies at this point removed any remaining sutures today.  The wound appears to be healing very nicely I encouraged range of motion activity and we placed her in a Darco shoe.

## 2022-11-10 ENCOUNTER — Ambulatory Visit: Payer: 59 | Admitting: Plastic Surgery

## 2022-11-10 ENCOUNTER — Ambulatory Visit (INDEPENDENT_AMBULATORY_CARE_PROVIDER_SITE_OTHER): Payer: 59

## 2022-11-10 ENCOUNTER — Encounter: Payer: Self-pay | Admitting: Podiatry

## 2022-11-10 ENCOUNTER — Ambulatory Visit (INDEPENDENT_AMBULATORY_CARE_PROVIDER_SITE_OTHER): Payer: 59 | Admitting: Podiatry

## 2022-11-10 DIAGNOSIS — M2012 Hallux valgus (acquired), left foot: Secondary | ICD-10-CM | POA: Diagnosis not present

## 2022-11-10 DIAGNOSIS — Z9889 Other specified postprocedural states: Secondary | ICD-10-CM

## 2022-11-10 NOTE — Progress Notes (Signed)
She presents today status post bunion repair that had a delayed wound healing date of surgery was 08/27/2022 she is nearly 100% completely healed at this point.  She is very happy with the outcome thus far states that she thinks the wound dehiscence may have come from all of the steroid that she had in her neck at the time of surgery due to her spinal injections.  Objective: Vital signs stable oriented x 3 still demonstrates postinflammatory hyperpigmentation she is gotten much better range of motion of the first metatarsophalangeal joint wound has gone on to heal uneventfully.  Assessment: Well-healing surgical foot.  Plan: Encouraged range of motion exercises follow-up with me on an as-needed basis.

## 2022-11-24 ENCOUNTER — Other Ambulatory Visit: Payer: Self-pay | Admitting: Orthopedic Surgery

## 2022-12-01 ENCOUNTER — Encounter (HOSPITAL_COMMUNITY): Payer: Self-pay

## 2022-12-01 NOTE — Progress Notes (Signed)
     Your surgery and Pre-Admission testing visit will be at [facility] located at [address], [place], [place] [address].  Please let all your doctors (i.e., Primary Care Physician, Cardiologist, Endocrinologist, Pulmonologist) know you are having surgery. You may need clearance for surgery. If you are on blood thinners, notify your surgeon and ask the doctor who prescribed them how long to hold them before surgery.  If you have had a heart test, such as an EKG, stress test, heart ultrasound, etc., or lab work performed outside of [facility], please bring copies of these tests to your Pre-Admission testing, if possible.  These departments may contact you before the day of surgery:  Pre-Service Center - insurance/ billing: [phone] Pharmacy- to review your medications: [phone] Pre-Admission Testing- to set an appointment for your visit: [phone]  (Often, these numbers show up as "SPAM" on your phone)  The Pre-Admission Testing (PAT) visit focuses on Anesthesia for your upcoming surgery.  You do NOT need to fast; take your medications as usual. Please arrive 30 minutes early to allow for parking and admitting.  The visit may last up to an hour. Bring a photo ID and medical insurance card. Reschedule if you are sick. ([phone]) and please, NO children under age 16 at the visit.  During the PAT visit:  We will review your medical and surgical history.   You will receive pre-operative instructions, including the time of arrival at the hospital and surgical start time.  We will review what medication(s) you can take on the day of surgery.  After speaking with the nurse, you will have blood drawn and, if needed, a chest x-ray and EKG.  Most lab results from your doctor are good for 30 days, Hemoglobin A1C is good for 60 days. If you cannot talk to the Pharmacy, bring your medications or a list of them to the PST visit.   Infection control for the [facility] requires: All fingernail and toenail products should be removed before the day of surgery.  (SNS, Acrylic, Gel, Polish, Stickers, Press on, and Poly gel nails.)   Parking information:  Address: [facility] - [address], [place], [place] [address]  Please look for signs for entrance A off of [address]. Free valet parking is available Monday-Friday 05:30am-06:00pm     

## 2022-12-02 NOTE — Pre-Procedure Instructions (Signed)
Surgical Instructions   Your procedure is scheduled on Wednesday, June 26th. Report to [facility] Main Entrance "A" at 11:15 A.M., then check in with the Admitting office. Any questions or running late day of surgery: call [phone]  Questions prior to your surgery date: call [phone], Monday-Friday, 8am-4pm. If you experience any cold or flu symptoms such as cough, fever, chills, shortness of breath, etc. between now and your scheduled surgery, please notify us at the above number.     Remember:  Do not eat after midnight the night before your surgery  You may drink clear liquids until 11:15 AM the morning of your surgery.   Clear liquids allowed are: Water, Non-Citrus Juices (without pulp), Carbonated Beverages, Clear Tea, Black Coffee Only (NO MILK, CREAM OR POWDERED CREAMER of any kind), and Gatorade.  Patient Instructions  The night before surgery:  No food after midnight. ONLY clear liquids after midnight  The day of surgery (if you do NOT have diabetes):  Drink ONE (1) Pre-Surgery Clear Ensure by 11:15 AM the morning of surgery. Drink in one sitting. Do not sip.  This drink was given to you during your hospital  pre-op appointment visit.  Nothing else to drink after completing the  Pre-Surgery Clear Ensure.          If you have questions, please contact your surgeon's office.      Take these medicines the morning of surgery with A SIP OF WATER  atorvastatin (LIPITOR)  gabapentin (NEURONTIN)  pantoprazole (PROTONIX)  sertraline (ZOLOFT)     May take these medicines IF NEEDED: albuterol (VENTOLIN HFA)- bring inhaler with you on day of surgery clonazePAM (KLONOPIN)  tiZANidine (ZANAFLEX)  traMADol (ULTRAM)   One week prior to surgery, STOP taking any Aspirin (unless otherwise instructed by your surgeon) Aleve, Naproxen, Ibuprofen, Motrin, Advil, Goody's, BC's, all herbal medications, fish oil, and non-prescription vitamins.                     Do NOT Smoke  (Tobacco/Vaping) for 24 hours prior to your procedure.  If you use a CPAP at night, you may bring your mask/headgear for your overnight stay.   You will be asked to remove any contacts, glasses, piercing's, hearing aid's, dentures/partials prior to surgery. Please bring cases for these items if needed.    Patients discharged the day of surgery will not be allowed to drive home, and someone needs to stay with them for 24 hours.  SURGICAL WAITING ROOM VISITATION Patients may have no more than 2 support people in the waiting area - these visitors may rotate.   Pre-op nurse will coordinate an appropriate time for 1 ADULT support person, who may not rotate, to accompany patient in pre-op.  Children under the age of 26 must have an adult with them who is not the patient and must remain in the main waiting area with an adult.  If the patient needs to stay at the hospital during part of their recovery, the visitor guidelines for inpatient rooms apply.  Please refer to the [facility] website for the visitor guidelines for any additional information.   If you received a COVID test during your pre-op visit  it is requested that you wear a mask when out in public, stay away from anyone that may not be feeling well and notify your surgeon if you develop symptoms. If you have been in contact with anyone that has tested positive in the last 10 days please notify you  Careers adviser.      Pre-operative 5 CHG Bath Instructions   You can play a key role in reducing the risk of infection after surgery. Your skin needs to be as free of germs as possible. You can reduce the number of germs on your skin by washing with CHG (chlorhexidine gluconate) soap before surgery. CHG is an antiseptic soap that kills germs and continues to kill germs even after washing.   DO NOT use if you have an allergy to chlorhexidine/CHG or antibacterial soaps. If your skin becomes reddened or irritated, stop using the CHG and notify one of  our RNs at [phone].   Please shower with the CHG soap starting 4 days before surgery using the following schedule:     Please keep in mind the following:  DO NOT shave, including legs and underarms, starting the day of your first shower.   You may shave your face at any point before/day of surgery.  Place clean sheets on your bed the day you start using CHG soap. Use a clean washcloth (not used since being washed) for each shower. DO NOT sleep with pets once you start using the CHG.   CHG Shower Instructions:  If you choose to wash your hair and private area, wash first with your normal shampoo/soap.  After you use shampoo/soap, rinse your hair and body thoroughly to remove shampoo/soap residue.  Turn the water OFF and apply about 3 tablespoons (45 ml) of CHG soap to a CLEAN washcloth.  Apply CHG soap ONLY FROM YOUR NECK DOWN TO YOUR TOES (washing for 3-5 minutes)  DO NOT use CHG soap on face, private areas, open wounds, or sores.  Pay special attention to the area where your surgery is being performed.  If you are having back surgery, having someone wash your back for you may be helpful. Wait 2 minutes after CHG soap is applied, then you may rinse off the CHG soap.  Pat dry with a clean towel  Put on clean clothes/pajamas   If you choose to wear lotion, please use ONLY the CHG-compatible lotions on the back of this paper.   Additional instructions for the day of surgery: DO NOT APPLY any lotions, deodorants, cologne, or perfumes.   Do not bring valuables to the hospital. [facility] is not responsible for any belongings/valuables. Do not wear nail polish, gel polish, artificial nails, or any other type of covering on natural nails (fingers and toes) Do not wear jewelry or makeup Put on clean/comfortable clothes.  Please brush your teeth.  Ask your nurse before applying any prescription medications to the skin.     CHG Compatible Lotions   Aveeno Moisturizing lotion   Cetaphil Moisturizing Cream  Cetaphil Moisturizing Lotion  Clairol Herbal Essence Moisturizing Lotion, Dry Skin  Clairol Herbal Essence Moisturizing Lotion, Extra Dry Skin  Clairol Herbal Essence Moisturizing Lotion, Normal Skin  Curel Age Defying Therapeutic Moisturizing Lotion with Alpha Hydroxy  Curel Extreme Care Body Lotion  Curel Soothing Hands Moisturizing Hand Lotion  Curel Therapeutic Moisturizing Cream, Fragrance-Free  Curel Therapeutic Moisturizing Lotion, Fragrance-Free  Curel Therapeutic Moisturizing Lotion, Original Formula  Eucerin Daily Replenishing Lotion  Eucerin Dry Skin Therapy Plus Alpha Hydroxy Crme  Eucerin Dry Skin Therapy Plus Alpha Hydroxy Lotion  Eucerin Original Crme  Eucerin Original Lotion  Eucerin Plus Crme Eucerin Plus Lotion  Eucerin TriLipid Replenishing Lotion  Keri Anti-Bacterial Hand Lotion  Keri Deep Conditioning Original Lotion Dry Skin Formula Softly Scented  Keri Deep Conditioning Original Lotion,  Fragrance Free Sensitive Skin Formula  Keri Lotion Fast Absorbing Fragrance Free Sensitive Skin Formula  Keri Lotion Fast Absorbing Softly Scented Dry Skin Formula  Keri Original Lotion  Keri Skin Renewal Lotion Keri Silky Smooth Lotion  Keri Silky Smooth Sensitive Skin Lotion  Nivea Body Creamy Conditioning Oil  Nivea Body Extra Enriched Lotion  Nivea Body Original Lotion  Nivea Body Sheer Moisturizing Lotion Nivea Crme  Nivea Skin Firming Lotion  NutraDerm 30 Skin Lotion  NutraDerm Skin Lotion  NutraDerm Therapeutic Skin Cream  NutraDerm Therapeutic Skin Lotion  ProShield Protective Hand Cream  Provon moisturizing lotion  Please read over the following fact sheets that you were given.

## 2022-12-03 ENCOUNTER — Other Ambulatory Visit: Payer: Self-pay

## 2022-12-03 ENCOUNTER — Encounter (HOSPITAL_COMMUNITY)
Admission: RE | Admit: 2022-12-03 | Discharge: 2022-12-03 | Disposition: A | Discharge status: Home / Self Care | Payer: 59 | Source: Ambulatory Visit | Attending: Orthopedic Surgery | Admitting: Orthopedic Surgery

## 2022-12-03 ENCOUNTER — Encounter (HOSPITAL_COMMUNITY): Payer: Self-pay

## 2022-12-03 VITALS — BP 113/78 | HR 85 | Temp 98.2°F | Resp 18 | Ht 67.0 in | Wt 190.0 lb

## 2022-12-03 DIAGNOSIS — Z01818 Encounter for other preprocedural examination: Secondary | ICD-10-CM | POA: Diagnosis present

## 2022-12-03 LAB — SURGICAL PCR SCREEN
MRSA, PCR: NEGATIVE
Staphylococcus aureus: NEGATIVE

## 2022-12-03 NOTE — Progress Notes (Signed)
PCP - Dr. Kandyce Rud Cardiologist - denies  PPM/ICD - denies   Chest x-ray - 12/06/21 EKG - 12/06/21 (n/a) Stress Test - 15 years ago per pt, unsure where, pt states it was normal ECHO - denies Cardiac Cath - 2012  Sleep Study - denies (pt states she used to have OSA and used a CPAP but she lost weight and no longer has issues or needs a CPAP)  CPAP - denies  DM- denies  ASA/Blood Thinner Instructions: n/a   ERAS Protcol - yes PRE-SURGERY Ensure given at PAT  COVID TEST- n/a   Anesthesia review: no  Patient denies shortness of breath, fever, cough and chest pain at PAT appointment   All instructions explained to the patient, with a verbal understanding of the material. Patient agrees to go over the instructions while at home for a better understanding.  The opportunity to ask questions was provided.

## 2022-12-15 ENCOUNTER — Encounter (HOSPITAL_COMMUNITY): Payer: Self-pay | Admitting: Orthopedic Surgery

## 2022-12-15 ENCOUNTER — Ambulatory Visit (HOSPITAL_BASED_OUTPATIENT_CLINIC_OR_DEPARTMENT_OTHER): Payer: 59 | Admitting: Certified Registered Nurse Anesthetist

## 2022-12-15 ENCOUNTER — Ambulatory Visit (HOSPITAL_COMMUNITY): Payer: 59 | Admitting: Certified Registered Nurse Anesthetist

## 2022-12-15 ENCOUNTER — Encounter (HOSPITAL_COMMUNITY)
Admission: RE | Disposition: A | Discharge status: Home / Self Care | Payer: Self-pay | Source: Home / Self Care | Attending: Orthopedic Surgery

## 2022-12-15 ENCOUNTER — Other Ambulatory Visit: Payer: Self-pay

## 2022-12-15 ENCOUNTER — Ambulatory Visit (HOSPITAL_COMMUNITY): Payer: 59

## 2022-12-15 ENCOUNTER — Ambulatory Visit (HOSPITAL_COMMUNITY)
Admission: RE | Admit: 2022-12-15 | Discharge: 2022-12-15 | Disposition: A | Discharge status: Home / Self Care | Payer: 59 | Attending: Orthopedic Surgery | Admitting: Orthopedic Surgery

## 2022-12-15 DIAGNOSIS — M4802 Spinal stenosis, cervical region: Secondary | ICD-10-CM | POA: Diagnosis not present

## 2022-12-15 DIAGNOSIS — G9520 Unspecified cord compression: Secondary | ICD-10-CM | POA: Insufficient documentation

## 2022-12-15 DIAGNOSIS — G4730 Sleep apnea, unspecified: Secondary | ICD-10-CM | POA: Insufficient documentation

## 2022-12-15 DIAGNOSIS — F419 Anxiety disorder, unspecified: Secondary | ICD-10-CM | POA: Insufficient documentation

## 2022-12-15 DIAGNOSIS — M5412 Radiculopathy, cervical region: Secondary | ICD-10-CM

## 2022-12-15 DIAGNOSIS — F319 Bipolar disorder, unspecified: Secondary | ICD-10-CM | POA: Insufficient documentation

## 2022-12-15 DIAGNOSIS — K219 Gastro-esophageal reflux disease without esophagitis: Secondary | ICD-10-CM | POA: Insufficient documentation

## 2022-12-15 DIAGNOSIS — Z87891 Personal history of nicotine dependence: Secondary | ICD-10-CM | POA: Diagnosis not present

## 2022-12-15 DIAGNOSIS — J449 Chronic obstructive pulmonary disease, unspecified: Secondary | ICD-10-CM | POA: Diagnosis not present

## 2022-12-15 DIAGNOSIS — M5010 Cervical disc disorder with radiculopathy, unspecified cervical region: Secondary | ICD-10-CM

## 2022-12-15 DIAGNOSIS — J4489 Other specified chronic obstructive pulmonary disease: Secondary | ICD-10-CM | POA: Insufficient documentation

## 2022-12-15 HISTORY — PX: ANTERIOR CERVICAL DECOMP/DISCECTOMY FUSION: SHX1161

## 2022-12-15 SURGERY — ANTERIOR CERVICAL DECOMPRESSION/DISCECTOMY FUSION 1 LEVEL
Anesthesia: General | Site: Spine Cervical

## 2022-12-15 MED ORDER — LIDOCAINE 2% (20 MG/ML) 5 ML SYRINGE
INTRAMUSCULAR | Status: DC | PRN
  Administered 2022-12-15: 60 mg via INTRAVENOUS

## 2022-12-15 MED ORDER — FENTANYL CITRATE (PF) 250 MCG/5ML IJ SOLN
INTRAMUSCULAR | Status: DC | PRN
  Administered 2022-12-15: 150 ug via INTRAVENOUS
  Administered 2022-12-15: 50 ug via INTRAVENOUS

## 2022-12-15 MED ORDER — FENTANYL CITRATE (PF) 100 MCG/2ML IJ SOLN
25.0000 ug | INTRAMUSCULAR | Status: DC | PRN
  Administered 2022-12-15 (×3): 25 ug via INTRAVENOUS

## 2022-12-15 MED ORDER — LACTATED RINGERS IV SOLN: INTRAVENOUS | Status: DC

## 2022-12-15 MED ORDER — MIDAZOLAM HCL 2 MG/2ML IJ SOLN
INTRAMUSCULAR | Status: DC | PRN
  Administered 2022-12-15: 2 mg via INTRAVENOUS

## 2022-12-15 MED ORDER — BUPIVACAINE-EPINEPHRINE 0.25% -1:200000 IJ SOLN
INTRAMUSCULAR | Status: DC | PRN
  Administered 2022-12-15: 5 mL

## 2022-12-15 MED ORDER — ONDANSETRON HCL 4 MG/2ML IJ SOLN
INTRAMUSCULAR | Status: AC
  Filled 2022-12-15: qty 2

## 2022-12-15 MED ORDER — PROPOFOL 500 MG/50ML IV EMUL
INTRAVENOUS | Status: DC | PRN
  Administered 2022-12-15: 15 ug/kg/min via INTRAVENOUS

## 2022-12-15 MED ORDER — BUPIVACAINE-EPINEPHRINE (PF) 0.25% -1:200000 IJ SOLN
INTRAMUSCULAR | Status: AC
  Filled 2022-12-15: qty 30

## 2022-12-15 MED ORDER — CHLORHEXIDINE GLUCONATE 0.12 % MT SOLN
OROMUCOSAL | Status: AC
  Administered 2022-12-15: 15 mL via OROMUCOSAL
  Filled 2022-12-15: qty 15

## 2022-12-15 MED ORDER — MIDAZOLAM HCL 2 MG/2ML IJ SOLN
INTRAMUSCULAR | Status: AC
  Filled 2022-12-15: qty 2

## 2022-12-15 MED ORDER — CEFAZOLIN SODIUM-DEXTROSE 2-4 GM/100ML-% IV SOLN
2.0000 g | INTRAVENOUS | Status: AC
  Administered 2022-12-15: 2 g via INTRAVENOUS
  Filled 2022-12-15: qty 100

## 2022-12-15 MED ORDER — CHLORHEXIDINE GLUCONATE 0.12 % MT SOLN: 15.0000 mL | Freq: Once | OROMUCOSAL | Status: AC

## 2022-12-15 MED ORDER — ACETAMINOPHEN 10 MG/ML IV SOLN
INTRAVENOUS | Status: DC | PRN
  Administered 2022-12-15: 1000 mg via INTRAVENOUS

## 2022-12-15 MED ORDER — SUGAMMADEX SODIUM 200 MG/2ML IV SOLN
INTRAVENOUS | Status: DC | PRN
  Administered 2022-12-15: 200 mg via INTRAVENOUS

## 2022-12-15 MED ORDER — OXYCODONE HCL 5 MG/5ML PO SOLN
5.0000 mg | Freq: Once | ORAL | Status: AC | PRN
  Administered 2022-12-15: 5 mg via ORAL

## 2022-12-15 MED ORDER — ROCURONIUM BROMIDE 10 MG/ML (PF) SYRINGE
PREFILLED_SYRINGE | INTRAVENOUS | Status: AC
  Filled 2022-12-15: qty 10

## 2022-12-15 MED ORDER — 0.9 % SODIUM CHLORIDE (POUR BTL) OPTIME
TOPICAL | Status: DC | PRN
  Administered 2022-12-15: 1000 mL

## 2022-12-15 MED ORDER — ORAL CARE MOUTH RINSE: 15.0000 mL | Freq: Once | OROMUCOSAL | Status: AC

## 2022-12-15 MED ORDER — LIDOCAINE 2% (20 MG/ML) 5 ML SYRINGE
INTRAMUSCULAR | Status: AC
  Filled 2022-12-15: qty 5

## 2022-12-15 MED ORDER — PROPOFOL 10 MG/ML IV BOLUS
INTRAVENOUS | Status: DC | PRN
  Administered 2022-12-15: 110 mg via INTRAVENOUS

## 2022-12-15 MED ORDER — PROPOFOL 10 MG/ML IV BOLUS
INTRAVENOUS | Status: AC
  Filled 2022-12-15: qty 20

## 2022-12-15 MED ORDER — DEXAMETHASONE SODIUM PHOSPHATE 10 MG/ML IJ SOLN
INTRAMUSCULAR | Status: AC
  Filled 2022-12-15: qty 1

## 2022-12-15 MED ORDER — ACETAMINOPHEN 160 MG/5ML PO SOLN: 1000.0000 mg | Freq: Once | ORAL | Status: DC | PRN

## 2022-12-15 MED ORDER — OXYCODONE HCL 5 MG/5ML PO SOLN
ORAL | Status: AC
  Filled 2022-12-15: qty 5

## 2022-12-15 MED ORDER — POVIDONE-IODINE 7.5 % EX SOLN: Freq: Once | CUTANEOUS | Status: DC

## 2022-12-15 MED ORDER — TIZANIDINE HCL 4 MG PO TABS: 4.0000 mg | ORAL_TABLET | Freq: Four times a day (QID) | ORAL | 0 refills | Status: DC | PRN

## 2022-12-15 MED ORDER — DEXAMETHASONE SODIUM PHOSPHATE 10 MG/ML IJ SOLN
INTRAMUSCULAR | Status: DC | PRN
  Administered 2022-12-15: 10 mg via INTRAVENOUS

## 2022-12-15 MED ORDER — HYDROCODONE-ACETAMINOPHEN 5-325 MG PO TABS: 1.0000 | ORAL_TABLET | Freq: Four times a day (QID) | ORAL | 0 refills | Status: DC | PRN

## 2022-12-15 MED ORDER — FENTANYL CITRATE (PF) 100 MCG/2ML IJ SOLN
INTRAMUSCULAR | Status: AC
  Filled 2022-12-15: qty 2

## 2022-12-15 MED ORDER — KETAMINE HCL 10 MG/ML IJ SOLN
INTRAMUSCULAR | Status: DC | PRN
  Administered 2022-12-15: 20 mg via INTRAVENOUS
  Administered 2022-12-15: 10 mg via INTRAVENOUS

## 2022-12-15 MED ORDER — ONDANSETRON HCL 4 MG/2ML IJ SOLN
INTRAMUSCULAR | Status: DC | PRN
  Administered 2022-12-15: 4 mg via INTRAVENOUS

## 2022-12-15 MED ORDER — THROMBIN 20000 UNITS EX SOLR
CUTANEOUS | Status: AC
  Filled 2022-12-15: qty 20000

## 2022-12-15 MED ORDER — FENTANYL CITRATE (PF) 250 MCG/5ML IJ SOLN
INTRAMUSCULAR | Status: AC
  Filled 2022-12-15: qty 5

## 2022-12-15 MED ORDER — THROMBIN 20000 UNITS EX SOLR
CUTANEOUS | Status: DC | PRN
  Administered 2022-12-15: 20000 [IU] via TOPICAL

## 2022-12-15 MED ORDER — OXYCODONE HCL 5 MG PO TABS: 5.0000 mg | ORAL_TABLET | Freq: Once | ORAL | Status: AC | PRN

## 2022-12-15 MED ORDER — ACETAMINOPHEN 500 MG PO TABS: 1000.0000 mg | ORAL_TABLET | Freq: Once | ORAL | Status: DC | PRN

## 2022-12-15 MED ORDER — ROCURONIUM BROMIDE 10 MG/ML (PF) SYRINGE
PREFILLED_SYRINGE | INTRAVENOUS | Status: DC | PRN
  Administered 2022-12-15: 70 mg via INTRAVENOUS
  Administered 2022-12-15: 10 mg via INTRAVENOUS

## 2022-12-15 MED ORDER — KETAMINE HCL 50 MG/5ML IJ SOSY
PREFILLED_SYRINGE | INTRAMUSCULAR | Status: AC
  Filled 2022-12-15: qty 5

## 2022-12-15 MED ORDER — PROPOFOL 1000 MG/100ML IV EMUL
INTRAVENOUS | Status: AC
  Filled 2022-12-15: qty 100

## 2022-12-15 MED ORDER — ACETAMINOPHEN 10 MG/ML IV SOLN
INTRAVENOUS | Status: AC
  Filled 2022-12-15: qty 100

## 2022-12-15 MED ORDER — PHENYLEPHRINE HCL-NACL 20-0.9 MG/250ML-% IV SOLN
INTRAVENOUS | Status: DC | PRN
  Administered 2022-12-15: 25 ug/min via INTRAVENOUS

## 2022-12-15 MED ORDER — ACETAMINOPHEN 10 MG/ML IV SOLN: 1000.0000 mg | Freq: Once | INTRAVENOUS | Status: DC | PRN

## 2022-12-15 SURGICAL SUPPLY — 74 items
AGENT HMST KT MTR STRL THRMB (HEMOSTASIS)
APL SKNCLS NONHYPOALLERGENIC (GAUZE/BANDAGES/DRESSINGS) ×1
APL SKNCLS STERI-STRIP NONHPOA (GAUZE/BANDAGES/DRESSINGS) ×1
BAG COUNTER SPONGE SURGICOUNT (BAG) ×1 IMPLANT
BAG SPNG CNTER NS LX DISP (BAG) ×1
BENZOIN TINCTURE PRP APPL 2/3 (GAUZE/BANDAGES/DRESSINGS) ×1 IMPLANT
BIT DRILL NEURO 2X3.1 SFT TUCH (MISCELLANEOUS) ×1 IMPLANT
BIT DRILL SRG 14X2.2XFLT CHK (BIT) IMPLANT
BIT DRL SRG 14X2.2XFLT CHK (BIT) ×1
BLADE CLIPPER SURG (BLADE) ×1 IMPLANT
BLADE SURG 15 STRL LF DISP TIS (BLADE) ×1 IMPLANT
BLADE SURG 15 STRL SS (BLADE) ×1
CORD BIPOLAR FORCEPS 12FT (ELECTRODE) ×1 IMPLANT
COVER SURGICAL LIGHT HANDLE (MISCELLANEOUS) ×1 IMPLANT
DEVICE ENDSKLTN IMPLANT SM 7MM (Cage) IMPLANT
DRAIN JACKSON RD 7FR 3/32 (WOUND CARE) IMPLANT
DRAPE C-ARM 42X72 X-RAY (DRAPES) ×1 IMPLANT
DRAPE POUCH INSTRU U-SHP 10X18 (DRAPES) ×1 IMPLANT
DRAPE SURG 17X23 STRL (DRAPES) ×3 IMPLANT
DRILL BIT SKYLINE 14MM (BIT) ×1
DRILL NEURO 2X3.1 SOFT TOUCH (MISCELLANEOUS) ×1
DURAPREP 26ML APPLICATOR (WOUND CARE) ×1 IMPLANT
ELECT COATED BLADE 2.86 ST (ELECTRODE) ×1 IMPLANT
ELECT REM PT RETURN 9FT ADLT (ELECTROSURGICAL) ×1
ELECTRODE REM PT RTRN 9FT ADLT (ELECTROSURGICAL) ×1 IMPLANT
ENDOSKELETON IMPLANT SM 7MM (Cage) ×1 IMPLANT
EVACUATOR SILICONE 100CC (DRAIN) IMPLANT
GAUZE 4X4 16PLY ~~LOC~~+RFID DBL (SPONGE) ×1 IMPLANT
GAUZE SPONGE 4X4 12PLY STRL (GAUZE/BANDAGES/DRESSINGS) ×1 IMPLANT
GLOVE BIO SURGEON STRL SZ 6.5 (GLOVE) ×1 IMPLANT
GLOVE BIO SURGEON STRL SZ8 (GLOVE) ×1 IMPLANT
GLOVE BIOGEL PI IND STRL 7.0 (GLOVE) ×2 IMPLANT
GLOVE BIOGEL PI IND STRL 8 (GLOVE) ×1 IMPLANT
GLOVE SURG ENC MOIS LTX SZ6.5 (GLOVE) ×1 IMPLANT
GOWN STRL REUS W/ TWL LRG LVL3 (GOWN DISPOSABLE) ×1 IMPLANT
GOWN STRL REUS W/ TWL XL LVL3 (GOWN DISPOSABLE) ×1 IMPLANT
GOWN STRL REUS W/TWL LRG LVL3 (GOWN DISPOSABLE) ×1
GOWN STRL REUS W/TWL XL LVL3 (GOWN DISPOSABLE) ×1
IV CATH 14GX2 1/4 (CATHETERS) ×1 IMPLANT
KIT BASIN OR (CUSTOM PROCEDURE TRAY) ×1 IMPLANT
KIT TURNOVER KIT B (KITS) ×1 IMPLANT
MANIFOLD NEPTUNE II (INSTRUMENTS) ×1 IMPLANT
NDL PRECISIONGLIDE 27X1.5 (NEEDLE) ×1 IMPLANT
NDL SPNL 20GX3.5 QUINCKE YW (NEEDLE) ×1 IMPLANT
NEEDLE PRECISIONGLIDE 27X1.5 (NEEDLE) ×1 IMPLANT
NEEDLE SPNL 20GX3.5 QUINCKE YW (NEEDLE) ×1 IMPLANT
NS IRRIG 1000ML POUR BTL (IV SOLUTION) ×1 IMPLANT
PACK ORTHO CERVICAL (CUSTOM PROCEDURE TRAY) ×1 IMPLANT
PAD ARMBOARD 7.5X6 YLW CONV (MISCELLANEOUS) ×2 IMPLANT
PATTIES SURGICAL .5 X.5 (GAUZE/BANDAGES/DRESSINGS) IMPLANT
PATTIES SURGICAL .5 X1 (DISPOSABLE) IMPLANT
PIN DISTRACTION 14 (PIN) IMPLANT
PLATE ONE LEVEL SKYLINE 14MM (Plate) IMPLANT
POSITIONER HEAD DONUT 9IN (MISCELLANEOUS) ×1 IMPLANT
PUTTY BONE DBX 2.5 MIS (Bone Implant) IMPLANT
SCREW SKYLINE VAR OS 14MM (Screw) IMPLANT
SOL ANTI FOG 6CC (MISCELLANEOUS) IMPLANT
SPONGE INTESTINAL PEANUT (DISPOSABLE) ×1 IMPLANT
SPONGE SURGIFOAM ABS GEL 100 (HEMOSTASIS) IMPLANT
STRIP CLOSURE SKIN 1/2X4 (GAUZE/BANDAGES/DRESSINGS) ×1 IMPLANT
SURGIFLO W/THROMBIN 8M KIT (HEMOSTASIS) IMPLANT
SUT MNCRL AB 4-0 PS2 18 (SUTURE) ×1 IMPLANT
SUT SILK 4 0 (SUTURE)
SUT SILK 4-0 18XBRD TIE 12 (SUTURE) IMPLANT
SUT VIC AB 2-0 CT2 18 VCP726D (SUTURE) ×1 IMPLANT
SYR BULB IRRIG 60ML STRL (SYRINGE) ×1 IMPLANT
SYR CONTROL 10ML LL (SYRINGE) ×2 IMPLANT
TAPE CLOTH 4X10 WHT NS (GAUZE/BANDAGES/DRESSINGS) ×1 IMPLANT
TAPE CLOTH SOFT 2X10 (GAUZE/BANDAGES/DRESSINGS) IMPLANT
TAPE UMBILICAL 1/8X30 (MISCELLANEOUS) ×2 IMPLANT
TOWEL GREEN STERILE (TOWEL DISPOSABLE) ×1 IMPLANT
TOWEL GREEN STERILE FF (TOWEL DISPOSABLE) ×1 IMPLANT
WATER STERILE IRR 1000ML POUR (IV SOLUTION) ×1 IMPLANT
YANKAUER SUCT BULB TIP NO VENT (SUCTIONS) ×1 IMPLANT

## 2022-12-15 NOTE — Op Note (Signed)
PATIENT NAME: Denise Spencer   MEDICAL RECORD NO.:   [id number]    DATE OF BIRTH: 23-Oct-1960   DATE OF PROCEDURE: 12/15/2022                               OPERATIVE REPORT     PREOPERATIVE DIAGNOSES: 1. Left-sided cervical radiculopathy (M50.122) 2. Spinal stenosis, including spinal cord compression, C5/6   POSTOPERATIVE DIAGNOSES: 1. Left-sided cervical radiculopathy (M50.122) 2. Spinal stenosis, including spinal cord compression, C5/6   PROCEDURE: 1. Anterior cervical decompression and fusion C5/6 2. Placement of anterior instrumentation, C5/6 3. Insertion of interbody device x1 (7mm Titan intervertebral spacer). 4. Intraoperative use of fluoroscopy. 5. Use of morselized allograft - DBX mix.   SURGEON:  Estill Bamberg, MD   ASSISTANT:  Jason Coop, PA-C.   ANESTHESIA:  General endotracheal anesthesia.   COMPLICATIONS:  None.   DISPOSITION:  Stable.   ESTIMATED BLOOD LOSS:  Minimal.   INDICATIONS FOR SURGERY:  Briefly, Denise Spencer  is a pleasant 62 -year- old female, who did present to me with severe pain in her neck and left arm.   The patient's MRI did reveal the findings noted above.  Given the ongoing rather debilitating pain and lack of improvement with appropriate treatment measures, we did discuss proceeding with the procedure noted above.  The patient was fully aware of the risks and limitations of surgery as outlined in my preoperative note.   OPERATIVE DETAILS:  On 12/15/2022, the patient was brought to surgery and general endotracheal anesthesia was administered.  The patient was placed supine on the hospital bed. The neck was gently extended.  All bony prominences were meticulously padded.  The neck was prepped and draped in the usual sterile fashion.  At this point, I did make a left-sided transverse incision.  The platysma was incised.  A Smith-Robinson approach was used and the anterior spine was identified. A self-retaining retractor was placed.  I  then subperiosteally exposed the vertebral bodies from C5 through C6.  Caspar pins were then placed into the C5 and C6 vertebral bodies and distraction was applied.  A thorough and complete 5-6 intervertebral diskectomy was performed.  The posterior longitudinal ligament was identified and entered using a nerve hook.  I then used #1 followed by #2 Kerrison to perform a thorough and complete intervertebral diskectomy.  The spinal canal was thoroughly decompressed, as was the left neuroforamen.  The endplates were then prepared and the appropriate-sized intervertebral spacer was then packed with DBX mix and tamped into position in the usual fashion. The Caspar pins  then were removed and bone wax was placed in their place.  The appropriate-sized anterior cervical plate was placed over the anterior spine.  14 mm variable angle screws were placed, 2 in each vertebral body from C5-C6 for a total of 4 vertebral body screws.  The screws were then locked to the plate using the Cam locking mechanism.  I was very pleased with the final fluoroscopic images.  The wound was then irrigated.  The wound was then explored for any undue bleeding and there was no bleeding noted. The wound was then closed in layers using 2-0 Vicryl, followed by 4-0 Monocryl.  Benzoin and Steri-Strips were applied, followed by sterile dressing.  All instrument counts were correct at the termination of the procedure.   Of note, Jason Coop, PA-C, was my assistant throughout surgery, and did aid in retraction, placement of  the hardware, suctioning, and closure from start to finish.       Estill Bamberg, MD

## 2022-12-15 NOTE — Anesthesia Procedure Notes (Signed)
Procedure Name: Intubation Date/Time: 12/15/2022 11:42 AM  Performed by: Shary Decamp, CRNAPre-anesthesia Checklist: Patient identified, Patient being monitored, Timeout performed, Emergency Drugs available and Suction available Patient Re-evaluated:Patient Re-evaluated prior to induction Oxygen Delivery Method: Circle system utilized Preoxygenation: Pre-oxygenation with 100% oxygen Induction Type: IV induction Ventilation: Mask ventilation without difficulty Laryngoscope Size: Mac, 3 and Glidescope Grade View: Grade I Tube type: Oral Tube size: 7.0 mm Number of attempts: 1 Airway Equipment and Method: Rigid stylet and Video-laryngoscopy Placement Confirmation: ETT inserted through vocal cords under direct vision, positive ETCO2 and breath sounds checked- equal and bilateral Secured at: 21 cm Tube secured with: Tape Dental Injury: Teeth and Oropharynx as per pre-operative assessment

## 2022-12-15 NOTE — H&P (Addendum)
PREOPERATIVE H&P  Chief Complaint: Left arm pain  HPI: Denise Spencer is a 62 y.o. female who presents with ongoing pain in the left arm  MRI reveals a large disc herniation at C5/6, resulting in severe left C6 nerve compression and spinal cord compression   Patient has failed multiple forms of conservative care and continues to have pain (see office notes for additional details regarding the patient's full course of treatment)  Past Medical History:  Diagnosis Date   Anxiety    Arthritis    knees, lower back   Bipolar disorder (HCC)    Bulging lumbar disc    L5-S1   COPD (chronic obstructive pulmonary disease) (HCC)    MILD   Depression    Dyspnea    Fibromyalgia    GERD (gastroesophageal reflux disease)    PONV (postoperative nausea and vomiting)    with hysterectomy only   Sleep apnea    pt no longer uses CPAP since losing weight and states that she currently has no issues   Wears dentures    full upper and lower   Past Surgical History:  Procedure Laterality Date   ABDOMINAL HYSTERECTOMY W/ PARTIAL VAGINACTOMY     CARDIAC CATHETERIZATION  2012   CESAREAN SECTION     COLONOSCOPY     COLONOSCOPY WITH PROPOFOL N/A 11/01/2016   Procedure: COLONOSCOPY WITH PROPOFOL;  Surgeon: Midge Minium, MD;  Location: [facility];  Service: Endoscopy;  Laterality: N/A;   COLONOSCOPY WITH PROPOFOL N/A 05/22/2021   Procedure: COLONOSCOPY WITH BIOPSY;  Surgeon: Midge Minium, MD;  Location: [facility];  Service: Endoscopy;  Laterality: N/A;   ESOPHAGOGASTRODUODENOSCOPY (EGD) WITH PROPOFOL N/A 11/01/2016   Procedure: ESOPHAGOGASTRODUODENOSCOPY (EGD) WITH PROPOFOL;  Surgeon: Midge Minium, MD;  Location: [facility];  Service: Endoscopy;  Laterality: N/A;   HIP ARTHROPLASTY Left 04/2020   POLYPECTOMY  11/01/2016   Procedure: POLYPECTOMY;  Surgeon: Midge Minium, MD;  Location: [facility];  Service: Endoscopy;;   POLYPECTOMY N/A 05/22/2021    Procedure: POLYPECTOMY;  Surgeon: Midge Minium, MD;  Location: [facility];  Service: Endoscopy;  Laterality: N/A;   SEPTOPLASTY Left 12/27/2016   Procedure: SEPTOPLASTY,ENDOSCOPIC TRIMMING MIDDLE TURBINATE;  Surgeon: Vernie Murders, MD;  Location: [facility];  Service: ENT;  Laterality: Left;   TUBAL LIGATION     WRIST GANGLION EXCISION     Social History   Socioeconomic History   Marital status: Married    Spouse name: Not on file   Number of children: 2   Years of education: Not on file   Highest education level: Not on file  Occupational History   Not on file  Tobacco Use   Smoking status: Former    Packs/day: 0.50    Years: 40.00    Additional pack years: 0.00    Total pack years: 20.00    Types: Cigarettes    Start date: 05/04/1977    Quit date: 09/13/2022    Years since quitting: 0.2   Smokeless tobacco: Never   Tobacco comments:     Quit in 2018 but has since restarted after losing her daughter 04/2020  Vaping Use   Vaping Use: Never used  Substance and Sexual Activity   Alcohol use: No    Alcohol/week: 0.0 standard drinks of alcohol   Drug use: No   Sexual activity: Yes  Other Topics Concern   Not on file  Social History Narrative   Lives at home with husband and  adult son.  Pt is disabled.  Has 2 children.  Education: 12 th grade.    Social Determinants of Health   Financial Resource Strain: Not on file  Food Insecurity: Not on file  Transportation Needs: Not on file  Physical Activity: Not on file  Stress: Not on file  Social Connections: Not on file   Family History  Problem Relation Age of Onset   Breast cancer Mother 10   Bipolar disorder Mother    Depression Mother    Anxiety disorder Mother    Heart attack Father    Thyroid cancer Father    Hypertension Father    Depression Sister    Multiple sclerosis Sister    Bipolar disorder Sister    Hypertension Sister    Bipolar disorder Sister    Lymphoma Sister    Anxiety disorder Sister     Bipolar disorder Sister    Multiple sclerosis Other    Allergies  Allergen Reactions   Advair Diskus [Fluticasone-Salmeterol]     Caused thrush   Aspirin     Muscle and joint pain   Breo Ellipta [Fluticasone Furoate-Vilanterol]     Caused thrush   Nsaids Nausea And Vomiting   Tape     Some adhesives cause blisters.  Tegaderm is OK.   Amoxicillin Rash   Doxycycline Rash   Erythromycin Rash   Penicillins Rash   Sulfa Antibiotics Rash        Prior to Admission medications   Medication Sig Start Date End Date Taking? Authorizing Provider  albuterol (VENTOLIN HFA) 108 (90 Base) MCG/ACT inhaler Inhale 2 puffs into the lungs every 6 (six) hours as needed for wheezing or shortness of breath.   Yes [provider]  atorvastatin (LIPITOR) 10 MG tablet Take 10 mg by mouth daily. 08/05/22  Yes [provider]  Cholecalciferol (VITAMIN D3 MAXIMUM STRENGTH) 125 MCG (5000 UT) capsule Take 5,000 Units by mouth daily.   Yes [provider]  clonazePAM (KLONOPIN) 1 MG tablet Take 1 tablet (1 mg total) by mouth 4 (four) times daily as needed. Patient taking differently: Take 1-2 mg by mouth See admin instructions. Take 2 mg at night, may take a 1 mg dose twice daily as needed for anxiety 10/19/22  Yes [name], Jackquline Denmark, MD  gabapentin (NEURONTIN) 300 MG capsule Take 1,200 mg by mouth 3 (three) times daily. 02/03/22  Yes [provider]  nortriptyline (PAMELOR) 50 MG capsule Take 1 capsule (50 mg total) by mouth at bedtime. 10/19/22  Yes [name], Jackquline Denmark, MD  pantoprazole (PROTONIX) 40 MG tablet Take 40 mg by mouth daily. 12/14/21  Yes [provider]  QUEtiapine (SEROQUEL) 100 MG tablet Take 1 tablet (100 mg total) by mouth at bedtime. 1 by mouth at night 10/19/22  Yes [name], Jackquline Denmark, MD  sertraline (ZOLOFT) 100 MG tablet Take 1.5 tablets (150 mg total) by mouth daily. 10/19/22  Yes [name], Jackquline Denmark, MD  tiZANidine (ZANAFLEX) 4 MG tablet Take 4 mg by mouth 4 (four)  times daily as needed for muscle spasms. 02/01/22  Yes [provider]  traMADol (ULTRAM) 50 MG tablet Take 50 mg by mouth every 6 (six) hours as needed for moderate pain.   Yes [provider]  clindamycin (CLEOCIN) 150 MG capsule Take 1 capsule (150 mg total) by mouth 3 (three) times daily. Patient not taking: Reported on 09/27/2022 08/26/22   Hyatt, Max T, DPM  collagenase (SANTYL) 250 UNIT/GM ointment Apply 1 Application topically  daily. Wound measurements 4.5cm x 0.8cm Patient not taking: Reported on 11/30/2022 09/27/22   Edwin Cap, DPM  mupirocin ointment (BACTROBAN) 2 % Apply 1 Application topically 2 (two) times daily. Patient not taking: Reported on 11/30/2022 09/21/22   Edwin Cap, DPM  ondansetron (ZOFRAN) 4 MG tablet Take 1 tablet (4 mg total) by mouth every 8 (eight) hours as needed. Patient not taking: Reported on 09/27/2022 08/26/22   Hyatt, Max T, DPM  Vitamin D, Ergocalciferol, (DRISDOL) 1.25 MG (50000 UNIT) CAPS capsule TAKE 1 CAPSULE BY MOUTH EVERY 7 DAYS Patient not taking: Reported on 11/30/2022 09/29/22   Elinor Parkinson, DPM     All other systems have been reviewed and were otherwise negative with the exception of those mentioned in the HPI and as above.  Physical Exam: There were no vitals filed for this visit.  There is no height or weight on file to calculate BMI.  General: Alert, no acute distress Cardiovascular: No pedal edema Respiratory: No cyanosis, no use of accessory musculature Skin: No lesions in the area of chief complaint Neurologic: Sensation intact distally Psychiatric: Patient is competent for consent with normal mood and affect Lymphatic: No axillary or cervical lymphadenopathy   Assessment/Plan: LEFT-SIDED CERVICAL RADICULOPATHY Plan for Procedure(s): ANTERIOR CERVICAL DECOMPRESSION AND FUSION CERVICAL 5- CERVICAL 6 WITH INSTRUMENTATION AND ALLOGRAFT   Jackelyn Hoehn, MD 12/15/2022 7:46 AM

## 2022-12-15 NOTE — Transfer of Care (Signed)
Immediate Anesthesia Transfer of Care Note  Patient: Denise Spencer  Procedure(s) Performed: ANTERIOR CERVICAL DECOMPRESSION AND FUSION CERVICAL 5- CERVICAL 6 WITH INSTRUMENTATION AND ALLOGRAFT (Spine Cervical)  Patient Location: PACU  Anesthesia Type:General  Level of Consciousness: sedated  Airway & Oxygen Therapy: Patient Spontanous Breathing and Patient connected to face mask oxygen  Post-op Assessment: Post -op Vital signs reviewed and stable  Post vital signs: stable  Last Vitals:  Vitals Value Taken Time  BP 125/76 12/15/22 1322  Temp    Pulse 92 12/15/22 1323  Resp 11 12/15/22 1323  SpO2 99 % 12/15/22 1323  Vitals shown include unvalidated device data.  Last Pain:  Vitals:   12/15/22 0856  TempSrc:   PainSc: 6       Patients Stated Pain Goal: 0 (12/15/22 0856)  Complications: No notable events documented.

## 2022-12-15 NOTE — Progress Notes (Signed)
Fentanyl wasted. Witnessed by Carvel Getting, RN

## 2022-12-15 NOTE — Anesthesia Preprocedure Evaluation (Addendum)
Anesthesia Evaluation  Patient identified by MRN, date of birth, ID band Patient awake    Reviewed: Allergy & Precautions, NPO status , Patient's Chart, lab work & pertinent test results  History of Anesthesia Complications (+) PONV and history of anesthetic complications  Airway Mallampati: II  TM Distance: >3 FB Neck ROM: Full    Dental  (+) Dental Advisory Given   Pulmonary shortness of breath, sleep apnea , COPD, former smoker   breath sounds clear to auscultation       Cardiovascular negative cardio ROS  Rhythm:Regular     Neuro/Psych  PSYCHIATRIC DISORDERS Anxiety Depression Bipolar Disorder    Neuromuscular disease    GI/Hepatic Neg liver ROS,GERD  Medicated and Controlled,,  Endo/Other    Renal/GU Lab Results      Component                Value               Date                      CREATININE               0.98                12/07/2021                Musculoskeletal  (+) Arthritis ,  Fibromyalgia -  Abdominal   Peds  Hematology  (+) Blood dyscrasia, anemia Lab Results      Component                Value               Date                      WBC                      17.3 (H)            12/07/2021                HGB                      11.2 (L)            12/07/2021                HCT                      35.8 (L)            12/07/2021                MCV                      90.9                12/07/2021                PLT                      196                 12/07/2021              Anesthesia Other Findings   Reproductive/Obstetrics  Anesthesia Physical Anesthesia Plan  ASA: 3  Anesthesia Plan: General   Post-op Pain Management: Ofirmev IV (intra-op)*, Toradol IV (intra-op)* and Ketamine IV*   Induction: Intravenous  PONV Risk Score and Plan: 4 or greater and Ondansetron, Dexamethasone, Propofol infusion and Midazolam  Airway  Management Planned: Oral ETT and Video Laryngoscope Planned  Additional Equipment: None  Intra-op Plan:   Post-operative Plan: Extubation in OR  Informed Consent: I have reviewed the patients History and Physical, chart, labs and discussed the procedure including the risks, benefits and alternatives for the proposed anesthesia with the patient or authorized representative who has indicated his/her understanding and acceptance.     Dental advisory given  Plan Discussed with: CRNA  Anesthesia Plan Comments:        Anesthesia Quick Evaluation

## 2022-12-16 ENCOUNTER — Encounter (HOSPITAL_COMMUNITY): Payer: Self-pay | Admitting: Orthopedic Surgery

## 2022-12-20 ENCOUNTER — Ambulatory Visit: Payer: No Typology Code available for payment source | Admitting: Psychiatry

## 2022-12-21 NOTE — Anesthesia Postprocedure Evaluation (Signed)
Anesthesia Post Note  Patient: Denise Spencer  Procedure(s) Performed: ANTERIOR CERVICAL DECOMPRESSION AND FUSION CERVICAL 5- CERVICAL 6 WITH INSTRUMENTATION AND ALLOGRAFT (Spine Cervical)     Patient location during evaluation: PACU Anesthesia Type: General Level of consciousness: awake and alert Pain management: pain level controlled Vital Signs Assessment: post-procedure vital signs reviewed and stable Respiratory status: spontaneous breathing, nonlabored ventilation, respiratory function stable and patient connected to nasal cannula oxygen Cardiovascular status: blood pressure returned to baseline and stable Postop Assessment: no apparent nausea or vomiting Anesthetic complications: no   No notable events documented.  Last Vitals:  Vitals:   12/15/22 1415 12/15/22 1430  BP: 135/75 (!) 145/89  Pulse: 77 85  Resp: 13 16  Temp:  36.5 C  SpO2: 97% 99%    Last Pain:  Vitals:   12/15/22 1430  TempSrc:   PainSc: 2                  Makar Slatter

## 2023-04-25 ENCOUNTER — Encounter: Payer: Self-pay | Admitting: Podiatry

## 2023-04-25 ENCOUNTER — Ambulatory Visit (INDEPENDENT_AMBULATORY_CARE_PROVIDER_SITE_OTHER): Payer: 59 | Admitting: Podiatry

## 2023-04-25 ENCOUNTER — Other Ambulatory Visit: Payer: Self-pay | Admitting: Podiatry

## 2023-04-25 ENCOUNTER — Ambulatory Visit (INDEPENDENT_AMBULATORY_CARE_PROVIDER_SITE_OTHER): Payer: 59

## 2023-04-25 DIAGNOSIS — M2041 Other hammer toe(s) (acquired), right foot: Secondary | ICD-10-CM

## 2023-04-25 DIAGNOSIS — M778 Other enthesopathies, not elsewhere classified: Secondary | ICD-10-CM

## 2023-04-25 DIAGNOSIS — M722 Plantar fascial fibromatosis: Secondary | ICD-10-CM

## 2023-04-25 MED ORDER — TRIAMCINOLONE ACETONIDE 40 MG/ML IJ SUSP
40.0000 mg | Freq: Once | INTRAMUSCULAR | Status: AC
Start: 2023-04-25 — End: 2023-04-25
  Administered 2023-04-25: 40 mg

## 2023-04-25 NOTE — Progress Notes (Signed)
She presents today complaining of pain to the bilateral foot primarily on the left heel and the second metatarsophalangeal joint area of the right foot.  I would like to have surgery to correct the hallux interphalangeal on the right hallux and possibly shorten the elongated second metatarsal.  Objective: Vital signs stable oriented x 3.  There is no erythema edema salines drainage or odor pulses are palpable.  Pain on palpation with palpable mass plantar aspect of the left heel.  Consistent with bursitis.  She also has pain on palpation and end range of motion of the second metatarsophalangeal joint of the right foot.  Radiographs taken reviewed demonstrate well-healed Lapidus procedure.  Plan: Injected around the second metatarsal phalangeal joint and injected the left heel all with 20 mg Kenalog 5 mg Marcaine.

## 2023-05-27 DIAGNOSIS — M47816 Spondylosis without myelopathy or radiculopathy, lumbar region: Secondary | ICD-10-CM | POA: Diagnosis not present

## 2023-06-02 DIAGNOSIS — H04123 Dry eye syndrome of bilateral lacrimal glands: Secondary | ICD-10-CM | POA: Diagnosis not present

## 2023-06-18 NOTE — Progress Notes (Signed)
 New patient visit  Patient: Denise Spencer   DOB: 11/20/1960   [name]  MRN: [id number] Visit Date: 06/30/2023  Today's healthcare provider: Jolynn Spencer, PA-C   Chief Complaint  Patient presents with   Establish Care   Subjective    Denise Spencer is a 62 y.o. female who presents today as a new patient to establish care.   Discussed the use of AI scribe software for clinical note transcription with the patient, who gave verbal consent to proceed.  History of Present Illness   The patient, a long-term smoker with a history of depression, acid reflux, pneumonia, and musculoskeletal pain, presents to establish care. They report no new complaints. They have been managing their depression with medication and are currently seeking a new counselor. They have a history of smoking for approximately 45 years, but quit in 2020. However, due to the stress of caring for their seriously ill husband, they resumed smoking. They currently smoke half a pack a day. They have not had a mammogram in approximately three years and a Pap smear in approximately five years. They are willing to undergo these screenings.      Past Medical History:  Diagnosis Date   Allergy 1970's   Anxiety    Arthritis    knees, lower back   Asthma    Bipolar disorder (HCC)    Bulging lumbar disc    L5-S1   COPD (chronic obstructive pulmonary disease) (HCC)    MILD   Depression    Dyspnea    Fibromyalgia    GERD (gastroesophageal reflux disease)    PONV (postoperative nausea and vomiting)    with hysterectomy only   Sleep apnea    pt no longer uses CPAP since losing weight and states that she currently has no issues   Wears dentures    full upper and lower   Past Surgical History:  Procedure Laterality Date   ABDOMINAL HYSTERECTOMY  2002   ABDOMINAL HYSTERECTOMY W/ PARTIAL VAGINACTOMY     ANTERIOR CERVICAL DECOMP/DISCECTOMY FUSION N/A 12/15/2022   Procedure: ANTERIOR CERVICAL DECOMPRESSION AND  FUSION CERVICAL 5- CERVICAL 6 WITH INSTRUMENTATION AND ALLOGRAFT;  Surgeon: Beuford Anes, MD;  Location: [facility] OR;  Service: Orthopedics;  Laterality: N/A;   CARDIAC CATHETERIZATION  2012   CESAREAN SECTION     COLONOSCOPY     COLONOSCOPY WITH PROPOFOL  N/A 11/01/2016   Procedure: COLONOSCOPY WITH PROPOFOL ;  Surgeon: Jinny Carmine, MD;  Location: [facility];  Service: Endoscopy;  Laterality: N/A;   COLONOSCOPY WITH PROPOFOL  N/A 05/22/2021   Procedure: COLONOSCOPY WITH BIOPSY;  Surgeon: Jinny Carmine, MD;  Location: [facility];  Service: Endoscopy;  Laterality: N/A;   ESOPHAGOGASTRODUODENOSCOPY (EGD) WITH PROPOFOL  N/A 11/01/2016   Procedure: ESOPHAGOGASTRODUODENOSCOPY (EGD) WITH PROPOFOL ;  Surgeon: Jinny Carmine, MD;  Location: [facility];  Service: Endoscopy;  Laterality: N/A;   HIP ARTHROPLASTY Left 04/2020   JOINT REPLACEMENT  2021   POLYPECTOMY  11/01/2016   Procedure: POLYPECTOMY;  Surgeon: Jinny Carmine, MD;  Location: [facility];  Service: Endoscopy;;   POLYPECTOMY N/A 05/22/2021   Procedure: POLYPECTOMY;  Surgeon: Jinny Carmine, MD;  Location: [facility];  Service: Endoscopy;  Laterality: N/A;   SEPTOPLASTY Left 12/27/2016   Procedure: SEPTOPLASTY,ENDOSCOPIC TRIMMING MIDDLE TURBINATE;  Surgeon: Edda Mt, MD;  Location: [facility] ORS;  Service: ENT;  Laterality: Left;   SPINE SURGERY  2024   TUBAL LIGATION     WRIST GANGLION EXCISION     Family  Status  Relation Name Status   Mother Ronal Siskin Deceased   Father Lamar Guan Deceased   Sister Hadassah Martinis Alive   Sister  Alive   Sister Buel Guan Alive   Sister Dorthy Mountain Alive   Sister  Deceased   Other  Alive  No partnership data on file   Family History  Problem Relation Age of Onset   Breast cancer Mother 36   Bipolar disorder Mother    Depression Mother    Anxiety disorder Mother    Arthritis Mother    Cancer Mother    Obesity Mother    Varicose Veins  Mother    Heart attack Father    Thyroid  cancer Father    Hypertension Father    Arthritis Father    Cancer Father    Heart disease Father    Depression Sister    Multiple sclerosis Sister    Asthma Sister    Hypertension Sister    Learning disabilities Sister    Miscarriages / Stillbirths Sister    Bipolar disorder Sister    Hypertension Sister    Bipolar disorder Sister    Alcohol abuse Sister    Cancer Sister    Early death Sister    Miscarriages / Stillbirths Sister    Obesity Sister    Lymphoma Sister    Anxiety disorder Sister    Bipolar disorder Sister    Multiple sclerosis Other    Social History   Socioeconomic History   Marital status: Married    Spouse name: Not on file   Number of children: 2   Years of education: Not on file   Highest education level: GED or equivalent  Occupational History   Not on file  Tobacco Use   Smoking status: Every Day    Current packs/day: 0.00    Average packs/day: 0.5 packs/day for 45.4 years (22.7 ttl pk-yrs)    Types: Cigarettes    Start date: 05/04/1977    Last attempt to quit: 09/13/2022    Years since quitting: 0.7   Smokeless tobacco: Never   Tobacco comments:     Quit in 2018 but has since restarted after losing her daughter 04/2020  Vaping Use   Vaping status: Never Used  Substance and Sexual Activity   Alcohol use: No   Drug use: No   Sexual activity: Not Currently    Birth control/protection: None  Other Topics Concern   Not on file  Social History Narrative   Lives at home with husband and adult son.  Pt is disabled.  Has 2 children.  Education: 12 th grade.    Social Drivers of Health   Financial Resource Strain: Medium Risk (06/23/2023)   Overall Financial Resource Strain (CARDIA)    Difficulty of Paying Living Expenses: Somewhat hard  Food Insecurity: No Food Insecurity (06/23/2023)   Hunger Vital Sign    Worried About Running Out of Food in the Last Year: Never true    Ran Out of Food in the Last  Year: Never true  Transportation Needs: No Transportation Needs (06/23/2023)   PRAPARE - Administrator, Civil Service (Medical): No    Lack of Transportation (Non-Medical): No  Physical Activity: Sufficiently Active (06/23/2023)   Exercise Vital Sign    Days of Exercise per Week: 5 days    Minutes of Exercise per Session: 150+ min  Stress: No Stress Concern Present (06/23/2023)   [employer] of Occupational Health - Occupational Stress Questionnaire  Feeling of Stress : Not at all  Social Connections: Moderately Isolated (06/23/2023)   Social Connection and Isolation Panel [NHANES]    Frequency of Communication with Friends and Family: More than three times a week    Frequency of Social Gatherings with Friends and Family: Never    Attends Religious Services: Never    Database Administrator or Organizations: No    Attends Engineer, Structural: Not on file    Marital Status: Married   Outpatient Medications Prior to Visit  Medication Sig   albuterol  (VENTOLIN  HFA) 108 (90 Base) MCG/ACT inhaler Inhale 2 puffs into the lungs every 6 (six) hours as needed for wheezing or shortness of breath.   atorvastatin  (LIPITOR) 10 MG tablet Take 10 mg by mouth daily.   Cholecalciferol (VITAMIN D3 MAXIMUM STRENGTH) 125 MCG (5000 UT) capsule Take 5,000 Units by mouth daily.   clonazePAM  (KLONOPIN ) 1 MG tablet Take 1 tablet (1 mg total) by mouth 4 (four) times daily as needed. (Patient taking differently: Take 1-2 mg by mouth See admin instructions. Take 2 mg at night, may take a 1 mg dose twice daily as needed for anxiety)   collagenase  (SANTYL ) 250 UNIT/GM ointment Apply 1 Application topically daily. Wound measurements 4.5cm x 0.8cm (Patient not taking: Reported on 11/30/2022)   gabapentin  (NEURONTIN ) 300 MG capsule Take 1,200 mg by mouth 3 (three) times daily.   HYDROcodone -acetaminophen  (NORCO/VICODIN) 5-325 MG tablet Take 1 tablet by mouth every 6 (six) hours as needed for moderate  pain or severe pain.   methocarbamol (ROBAXIN) 500 MG tablet Take 500-1,000 mg by mouth every 6 (six) hours as needed.   mupirocin  ointment (BACTROBAN ) 2 % Apply 1 Application topically 2 (two) times daily. (Patient not taking: Reported on 11/30/2022)   nortriptyline  (PAMELOR ) 50 MG capsule Take 1 capsule (50 mg total) by mouth at bedtime.   ondansetron  (ZOFRAN ) 4 MG tablet Take 1 tablet (4 mg total) by mouth every 8 (eight) hours as needed. (Patient not taking: Reported on 09/27/2022)   pantoprazole  (PROTONIX ) 40 MG tablet Take 40 mg by mouth daily.   QUEtiapine  (SEROQUEL ) 100 MG tablet Take 1 tablet (100 mg total) by mouth at bedtime. 1 by mouth at night   sertraline  (ZOLOFT ) 100 MG tablet Take 1.5 tablets (150 mg total) by mouth daily.   simvastatin (ZOCOR) 10 MG tablet Take 10 mg by mouth daily.   tiZANidine  (ZANAFLEX ) 4 MG tablet Take 1-2 tablets (4-8 mg total) by mouth 4 (four) times daily as needed for muscle spasms.   traMADol  (ULTRAM ) 50 MG tablet Take 50 mg by mouth every 6 (six) hours as needed for moderate pain.   No facility-administered medications prior to visit.   Allergies  Allergen Reactions   Advair Diskus [Fluticasone -Salmeterol]     Caused thrush   Aspirin     Muscle and joint pain   Breo Ellipta  [Fluticasone  Furoate-Vilanterol]     Caused thrush   Nsaids Nausea And Vomiting   Tape     Some adhesives cause blisters.  Tegaderm is OK.   Amoxicillin Rash   Doxycycline Rash   Erythromycin Rash   Penicillins Rash   Sulfa Antibiotics Rash         Immunization History  Administered Date(s) Administered   PFIZER(Purple Top)SARS-COV-2 Vaccination 09/16/2019, 10/10/2019   Tdap 10/13/2021    Health Maintenance  Topic Date Due   Pneumococcal Vaccine 40-46 Years old (1 of 2 - PCV) Never done   Hepatitis C Screening  Never done   Zoster Vaccines- Shingrix (1 of 2) Never done   Cervical Cancer Screening (HPV/Pap Cotest)  Never done   Medicare Annual Wellness (AWV)   09/16/2018   COVID-19 Vaccine (3 - Pfizer risk series) 11/07/2019   Lung Cancer Screening  12/07/2022   INFLUENZA VACCINE  Never done   MAMMOGRAM  03/24/2023   Colonoscopy  05/22/2028   DTaP/Tdap/Td (2 - Td or Tdap) 10/14/2031   HIV Screening  Completed   HPV VACCINES  Aged Out    Patient Care Team: Anabel Lykins, PA-C as PCP - General (Physician Assistant) Fernand Fredy RAMAN, MD as Referring Physician (Internal Medicine) Dellie Louanne MATSU, MD (General Surgery)  Review of Systems  All other systems reviewed and are negative.  Except see HPI       Objective    BP 121/64 (BP Location: Left Arm, Patient Position: Sitting, Cuff Size: Large)   Pulse 81   Temp 98.4 F (36.9 C) (Oral)   Ht 5' 7 (1.702 m)   Wt 215 lb (97.5 kg)   SpO2 100%   BMI 33.67 kg/m     Physical Exam Vitals reviewed.  Constitutional:      General: She is not in acute distress.    Appearance: Normal appearance. She is well-developed. She is not diaphoretic.  HENT:     Head: Normocephalic and atraumatic.  Eyes:     General: No scleral icterus.    Conjunctiva/sclera: Conjunctivae normal.  Neck:     Thyroid : No thyromegaly.  Cardiovascular:     Rate and Rhythm: Normal rate and regular rhythm.     Pulses: Normal pulses.     Heart sounds: Normal heart sounds. No murmur heard. Pulmonary:     Effort: Pulmonary effort is normal. No respiratory distress.     Breath sounds: Normal breath sounds. No wheezing, rhonchi or rales.  Musculoskeletal:     Cervical back: Neck supple.     Right lower leg: No edema.     Left lower leg: No edema.  Lymphadenopathy:     Cervical: No cervical adenopathy.  Skin:    General: Skin is warm and dry.     Findings: No rash.  Neurological:     Mental Status: She is alert and oriented to person, place, and time. Mental status is at baseline.  Psychiatric:        Mood and Affect: Mood normal.        Behavior: Behavior normal.    Depression Screen    06/30/2023     9:27 AM  PHQ 2/9 Scores  PHQ - 2 Score 4  PHQ- 9 Score 10   No results found for any visits on 06/30/23.  Assessment & Plan      Morbid obesity (HCC) Chronic. Body mass index is 33.67 kg/m. Unintentional Weight Loss? Patient reports losing 12-14 pounds in the past month, which was not intentional. Patient is eating three times a day but has to force herself due to lack of taste and smell, nausea, and difficulty swallowing. -Order comprehensive blood work to investigate cause of weight loss. -Schedule follow-up appointment for physical exam. workup - Lipid panel - Hemoglobin A1c - TSH - CBC with Differential/Platelet - Comprehensive metabolic panel Will reassess after  receiving lab results  Smoking  chronic Patient has been smoking for 45 years, quit in 2021 but resumed due to stress. Currently smoking half a pack per day. -Encourage smoking cessation.  Depression, recurrent (HCC) Bipolar Disorder Patient is currently on  medication prescribed by a psychiatrist. Not currently in counseling but is seeking a provider. -Continue current psychiatric medications Clonazepam , seroquel  100mg , pamelor  50mg , sertraline  100mg , tramadol  50mg .  -Encourage patient to find a new counselor.  Breast cancer screening by mammogram - MM 3D Screening Breast Bilateral - [facility]; Future  Need for hepatitis C screening test - Hepatitis C antibody  Musculoskeletal Pain Patient has an orthopedist for ongoing musculoskeletal pain. -Continue current treatment plan with orthopedist.  Preventive Health Patient is due for mammogram and Pap smear. Last mammogram was 3 years ago and last Pap smear was 5 years ago. Patient is willing to undergo mammogram. -Schedule mammogram. -Perform Pap smear.  Follow-up in 2 weeks for physical exam and to review blood work results.    Encounter to establish care Welcomed to our clinic Reviewed past medical hx, social hx, family hx and  surgical hx Pt advised to send all vaccination records or screening   Return in about 4 weeks (around 07/28/2023) for CPE.    The patient was advised to call back or seek an in-person evaluation if the symptoms worsen or if the condition fails to improve as anticipated.  I discussed the assessment and treatment plan with the patient. The patient was provided an opportunity to ask questions and all were answered. The patient agreed with the plan and demonstrated an understanding of the instructions.  I, Raveena Hebdon, PA-C have reviewed all documentation for this visit. The documentation on  06/30/2023   for the exam, diagnosis, procedures, and orders are all accurate and complete.  Jolynn Spencer, [facility], MMS [facility] [phone] (phone) [phone] (fax)  [facility]

## 2023-06-20 DIAGNOSIS — Z91199 Patient's noncompliance with other medical treatment and regimen due to unspecified reason: Secondary | ICD-10-CM | POA: Diagnosis not present

## 2023-06-20 DIAGNOSIS — Z7289 Other problems related to lifestyle: Secondary | ICD-10-CM | POA: Diagnosis not present

## 2023-06-20 DIAGNOSIS — G894 Chronic pain syndrome: Secondary | ICD-10-CM | POA: Diagnosis not present

## 2023-06-30 ENCOUNTER — Ambulatory Visit (INDEPENDENT_AMBULATORY_CARE_PROVIDER_SITE_OTHER): Payer: 59 | Admitting: Physician Assistant

## 2023-06-30 ENCOUNTER — Encounter: Payer: Self-pay | Admitting: Physician Assistant

## 2023-06-30 VITALS — BP 121/64 | HR 81 | Temp 98.4°F | Ht 67.0 in | Wt 215.0 lb

## 2023-06-30 DIAGNOSIS — Z7689 Persons encountering health services in other specified circumstances: Secondary | ICD-10-CM

## 2023-06-30 DIAGNOSIS — Z1231 Encounter for screening mammogram for malignant neoplasm of breast: Secondary | ICD-10-CM

## 2023-06-30 DIAGNOSIS — Z1159 Encounter for screening for other viral diseases: Secondary | ICD-10-CM | POA: Diagnosis not present

## 2023-06-30 DIAGNOSIS — F339 Major depressive disorder, recurrent, unspecified: Secondary | ICD-10-CM

## 2023-06-30 DIAGNOSIS — F17200 Nicotine dependence, unspecified, uncomplicated: Secondary | ICD-10-CM | POA: Diagnosis not present

## 2023-07-03 ENCOUNTER — Encounter: Payer: Self-pay | Admitting: Physician Assistant

## 2023-07-04 ENCOUNTER — Encounter: Payer: Self-pay | Admitting: Physician Assistant

## 2023-07-05 DIAGNOSIS — M47816 Spondylosis without myelopathy or radiculopathy, lumbar region: Secondary | ICD-10-CM | POA: Diagnosis not present

## 2023-07-14 ENCOUNTER — Ambulatory Visit
Admission: RE | Admit: 2023-07-14 | Discharge: 2023-07-14 | Disposition: A | Discharge status: Home / Self Care | Payer: 59 | Source: Ambulatory Visit | Attending: Physician Assistant | Admitting: Physician Assistant

## 2023-07-14 DIAGNOSIS — Z1231 Encounter for screening mammogram for malignant neoplasm of breast: Secondary | ICD-10-CM | POA: Diagnosis not present

## 2023-07-18 ENCOUNTER — Encounter: Payer: Self-pay | Admitting: Physician Assistant

## 2023-07-19 DIAGNOSIS — G894 Chronic pain syndrome: Secondary | ICD-10-CM | POA: Diagnosis not present

## 2023-07-19 DIAGNOSIS — Z7289 Other problems related to lifestyle: Secondary | ICD-10-CM | POA: Diagnosis not present

## 2023-07-19 DIAGNOSIS — Z91199 Patient's noncompliance with other medical treatment and regimen due to unspecified reason: Secondary | ICD-10-CM | POA: Diagnosis not present

## 2023-07-25 DIAGNOSIS — Z1159 Encounter for screening for other viral diseases: Secondary | ICD-10-CM | POA: Diagnosis not present

## 2023-07-26 ENCOUNTER — Encounter: Payer: Self-pay | Admitting: Physician Assistant

## 2023-07-26 LAB — CBC WITH DIFFERENTIAL/PLATELET
Basophils Absolute: 0.1 10*3/uL (ref 0.0–0.2)
Basos: 1 %
EOS (ABSOLUTE): 0.2 10*3/uL (ref 0.0–0.4)
Eos: 2 %
Hematocrit: 44 % (ref 34.0–46.6)
Hemoglobin: 14.3 g/dL (ref 11.1–15.9)
Immature Grans (Abs): 0 10*3/uL (ref 0.0–0.1)
Immature Granulocytes: 0 %
Lymphocytes Absolute: 3.4 10*3/uL — ABNORMAL HIGH (ref 0.7–3.1)
Lymphs: 37 %
MCH: 28.9 pg (ref 26.6–33.0)
MCHC: 32.5 g/dL (ref 31.5–35.7)
MCV: 89 fL (ref 79–97)
Monocytes Absolute: 0.4 10*3/uL (ref 0.1–0.9)
Monocytes: 4 %
Neutrophils Absolute: 5.2 10*3/uL (ref 1.4–7.0)
Neutrophils: 56 %
Platelets: 277 10*3/uL (ref 150–450)
RBC: 4.94 x10E6/uL (ref 3.77–5.28)
RDW: 12.9 % (ref 11.7–15.4)
WBC: 9.3 10*3/uL (ref 3.4–10.8)

## 2023-07-26 LAB — TSH: TSH: 6.12 u[IU]/mL — ABNORMAL HIGH (ref 0.450–4.500)

## 2023-07-26 LAB — LIPID PANEL
Chol/HDL Ratio: 4.6 {ratio} — ABNORMAL HIGH (ref 0.0–4.4)
Cholesterol, Total: 289 mg/dL — ABNORMAL HIGH (ref 100–199)
HDL: 63 mg/dL (ref >39)
LDL Chol Calc (NIH): 196 mg/dL — ABNORMAL HIGH (ref 0–99)
Triglycerides: 161 mg/dL — ABNORMAL HIGH (ref 0–149)
VLDL Cholesterol Cal: 30 mg/dL (ref 5–40)

## 2023-07-26 LAB — COMPREHENSIVE METABOLIC PANEL
ALT: 14 [IU]/L (ref 0–32)
AST: 17 [IU]/L (ref 0–40)
Albumin: 4.3 g/dL (ref 3.8–4.9)
Alkaline Phosphatase: 133 [IU]/L — ABNORMAL HIGH (ref 44–121)
BUN/Creatinine Ratio: 8 — ABNORMAL LOW (ref 12–28)
BUN: 8 mg/dL (ref 8–27)
Bilirubin Total: 0.2 mg/dL (ref 0.0–1.2)
CO2: 26 mmol/L (ref 20–29)
Calcium: 9.7 mg/dL (ref 8.7–10.3)
Chloride: 100 mmol/L (ref 96–106)
Creatinine, Ser: 0.97 mg/dL (ref 0.57–1.00)
Globulin, Total: 2.6 g/dL (ref 1.5–4.5)
Glucose: 112 mg/dL — ABNORMAL HIGH (ref 70–99)
Potassium: 4.7 mmol/L (ref 3.5–5.2)
Sodium: 141 mmol/L (ref 134–144)
Total Protein: 6.9 g/dL (ref 6.0–8.5)
eGFR: 67 mL/min/{1.73_m2} (ref >59)

## 2023-07-26 LAB — HEMOGLOBIN A1C
Est. average glucose Bld gHb Est-mCnc: 123 mg/dL
Hgb A1c MFr Bld: 5.9 % — ABNORMAL HIGH (ref 4.8–5.6)

## 2023-07-26 LAB — HEPATITIS C ANTIBODY: Hep C Virus Ab: NONREACTIVE

## 2023-08-01 DIAGNOSIS — G894 Chronic pain syndrome: Secondary | ICD-10-CM | POA: Diagnosis not present

## 2023-08-01 DIAGNOSIS — M797 Fibromyalgia: Secondary | ICD-10-CM | POA: Diagnosis not present

## 2023-08-03 ENCOUNTER — Ambulatory Visit (INDEPENDENT_AMBULATORY_CARE_PROVIDER_SITE_OTHER): Payer: 59 | Admitting: Podiatry

## 2023-08-03 ENCOUNTER — Encounter: Payer: Self-pay | Admitting: Podiatry

## 2023-08-03 DIAGNOSIS — M2021 Hallux rigidus, right foot: Secondary | ICD-10-CM

## 2023-08-03 DIAGNOSIS — M778 Other enthesopathies, not elsewhere classified: Secondary | ICD-10-CM

## 2023-08-03 NOTE — Progress Notes (Signed)
She presents today for a discussion regarding her first metatarsophalangeal joint of her right foot.  States that it has been bothering her for quite some time and she is having a lot of pain around the second and third metatarsophalangeal joints of her right foot.  She healed very well from her Lapidus procedure which has now resulted in some osteoarthritic change at the first metatarsal phalangeal joint.  I reviewed her past medical history medications allergies.  Objective: Vital signs stable alert oriented x 3 reviewed past medical history medications allergies surgery social history.  She denies fever chills nausea vomit muscle aches pains calf pain back pain chest pain shortness of breath.  Hallux limitus on physical exam with hallux abductus resulting in irritation of the second toe she has had a osteotomy which has resulted in an apparent elongated plantarflexed second third metatarsal which is painful.  She has pain on end range of motion of the second and third toes at the metatarsal phalangeal joint as well as the first.  Reviewed radiographs with Dr. Lilian Kapur today he is recommending fusion of the first metatarsophalangeal joint with plate system as well as a second and third met osteotomy.  Assessment: Plantarflexed second and third metatarsals with chronic capsulitis.  Hallux abductus with hallux limitus first metatarsal phalangeal joint.  Plan: She will reappoint to Monday with Dr. Lilian Kapur for discussion fusion of the first metatarsophalangeal joint.

## 2023-08-04 ENCOUNTER — Ambulatory Visit: Payer: 59 | Admitting: Physician Assistant

## 2023-08-04 ENCOUNTER — Encounter: Payer: Self-pay | Admitting: Physician Assistant

## 2023-08-04 VITALS — BP 132/81 | HR 89 | Resp 20 | Ht 67.0 in | Wt 215.9 lb

## 2023-08-04 DIAGNOSIS — F17200 Nicotine dependence, unspecified, uncomplicated: Secondary | ICD-10-CM | POA: Diagnosis not present

## 2023-08-04 DIAGNOSIS — E7849 Other hyperlipidemia: Secondary | ICD-10-CM

## 2023-08-04 DIAGNOSIS — Z124 Encounter for screening for malignant neoplasm of cervix: Secondary | ICD-10-CM

## 2023-08-04 DIAGNOSIS — R0683 Snoring: Secondary | ICD-10-CM

## 2023-08-04 DIAGNOSIS — Z780 Asymptomatic menopausal state: Secondary | ICD-10-CM | POA: Diagnosis not present

## 2023-08-04 DIAGNOSIS — K219 Gastro-esophageal reflux disease without esophagitis: Secondary | ICD-10-CM

## 2023-08-04 DIAGNOSIS — IMO0001 Reserved for inherently not codable concepts without codable children: Secondary | ICD-10-CM

## 2023-08-04 DIAGNOSIS — Z0000 Encounter for general adult medical examination without abnormal findings: Secondary | ICD-10-CM

## 2023-08-04 DIAGNOSIS — F339 Major depressive disorder, recurrent, unspecified: Secondary | ICD-10-CM

## 2023-08-04 DIAGNOSIS — Z0001 Encounter for general adult medical examination with abnormal findings: Secondary | ICD-10-CM

## 2023-08-04 NOTE — Progress Notes (Unsigned)
Annual Wellness Visit     Patient: Denise Spencer, Female    DOB: December 26, 1960, 63 y.o.   MRN: [id number] Visit Date: 08/04/2023  Today's Provider: Debera Lat, PA-C   Chief Complaint  Patient presents with   Annual Exam   Subjective    Denise Spencer is a 63 y.o. female who presents today for her Annual Wellness Visit.  Discussed the use of AI scribe software for clinical note transcription with the patient, who gave verbal consent to proceed.  History of Present Illness   The patient, with a history of high cholesterol, rheumatoid arthritis, and memory issues, presents with multiple concerns. She reports that she had stopped taking her atorvastatin medication unintentionally due to a mix-up with her medication storage system. She has since resumed taking the medication after noticing her cholesterol levels were high in her recent blood work.  The patient also reports issues with sleep, stating that she is not achieving REM sleep and is getting less than an hour of it each night. She has a history of sleep apnea which had resolved after weight loss, but she suspects it may have returned due to recent weight gain. She reports loud snoring but no gasping for breath. She has previously used a CPAP machine which she found helpful.  Additionally, the patient reports a build-up of ear wax which is not relieved by over-the-counter treatments. She has seen an ENT specialist in the past and is due for a follow-up visit.  The patient also mentions memory issues, describing her memory as "foggy". She is currently on multiple medications prescribed by her psychiatrist, some of which may be affecting her memory. She expresses concern about the potential long-term effects of these medications on her cognitive function.  The patient is a smoker and acknowledges the need to quit, but states that she is not currently ready to do so due to stress. She has previously quit smoking in the past.        Medications: Outpatient Medications Prior to Visit  Medication Sig   albuterol (VENTOLIN HFA) 108 (90 Base) MCG/ACT inhaler Inhale 2 puffs into the lungs every 6 (six) hours as needed for wheezing or shortness of breath.   atorvastatin (LIPITOR) 10 MG tablet Take 10 mg by mouth daily.   Cholecalciferol (VITAMIN D3 MAXIMUM STRENGTH) 125 MCG (5000 UT) capsule Take 5,000 Units by mouth daily.   clonazePAM (KLONOPIN) 1 MG tablet Take 1 tablet (1 mg total) by mouth 4 (four) times daily as needed. (Patient taking differently: Take 1-2 mg by mouth See admin instructions. Take 2 mg at night, may take a 1 mg dose twice daily as needed for anxiety)   gabapentin (NEURONTIN) 300 MG capsule Take 1,200 mg by mouth 3 (three) times daily.   HYDROcodone-acetaminophen (NORCO/VICODIN) 5-325 MG tablet Take 1 tablet by mouth every 6 (six) hours as needed for moderate pain or severe pain.   metaxalone (SKELAXIN) 400 MG tablet Take 400 mg by mouth 3 (three) times daily as needed.   nortriptyline (PAMELOR) 50 MG capsule Take 1 capsule (50 mg total) by mouth at bedtime.   pantoprazole (PROTONIX) 40 MG tablet Take 40 mg by mouth daily.   sertraline (ZOLOFT) 100 MG tablet Take 1.5 tablets (150 mg total) by mouth daily.   traMADol (ULTRAM) 50 MG tablet Take 50 mg by mouth every 6 (six) hours as needed for moderate pain.   No facility-administered medications prior to visit.    Allergies  Allergen  Reactions   Advair Diskus [Fluticasone-Salmeterol]     Caused thrush   Aspirin     Muscle and joint pain   Breo Ellipta [Fluticasone Furoate-Vilanterol]     Caused thrush   Nsaids Nausea And Vomiting   Tape     Some adhesives cause blisters.  Tegaderm is OK.   Amoxicillin Rash   Doxycycline Rash   Erythromycin Rash   Penicillins Rash   Sulfa Antibiotics Rash         Patient Care Team: Cherlynn Polo as PCP - General (Physician Assistant) Margaretann Loveless, MD as Referring Physician (Internal  Medicine) Kieth Brightly, MD (General Surgery)  Review of Systems  All other systems reviewed and are negative.        Objective    Vitals: BP 132/81   Pulse 89   Resp 20   Ht 5\' 7"  (1.702 m)   Wt 215 lb 14.4 oz (97.9 kg)   SpO2 100%   BMI 33.81 kg/m      Physical Exam Vitals reviewed.  Constitutional:      General: She is not in acute distress.    Appearance: Normal appearance. She is well-developed. She is not ill-appearing, toxic-appearing or diaphoretic.  HENT:     Head: Normocephalic and atraumatic.     Right Ear: Tympanic membrane, ear canal and external ear normal.     Left Ear: Tympanic membrane, ear canal and external ear normal.     Nose: Nose normal. No congestion or rhinorrhea.     Mouth/Throat:     Mouth: Mucous membranes are moist.     Pharynx: Oropharynx is clear. No oropharyngeal exudate.  Eyes:     General: No scleral icterus.       Right eye: No discharge.        Left eye: No discharge.     Conjunctiva/sclera: Conjunctivae normal.     Pupils: Pupils are equal, round, and reactive to light.  Neck:     Thyroid: No thyromegaly.     Vascular: No carotid bruit.  Cardiovascular:     Rate and Rhythm: Normal rate and regular rhythm.     Pulses: Normal pulses.     Heart sounds: Normal heart sounds. No murmur heard.    No friction rub. No gallop.  Pulmonary:     Effort: Pulmonary effort is normal. No respiratory distress.     Breath sounds: Normal breath sounds. No wheezing or rales.  Abdominal:     General: Abdomen is flat. Bowel sounds are normal. There is no distension.     Palpations: Abdomen is soft. There is no mass.     Tenderness: There is no abdominal tenderness. There is no right CVA tenderness, left CVA tenderness, guarding or rebound.     Hernia: No hernia is present.  Musculoskeletal:        General: No swelling, tenderness, deformity or signs of injury. Normal range of motion.     Cervical back: Normal range of motion and neck  supple. No rigidity or tenderness.     Right lower leg: No edema.     Left lower leg: No edema.  Lymphadenopathy:     Cervical: No cervical adenopathy.  Skin:    General: Skin is warm and dry.     Coloration: Skin is not jaundiced or pale.     Findings: No bruising, erythema, lesion or rash.  Neurological:     Mental Status: She is alert and oriented to person, place, and  time. Mental status is at baseline.     Gait: Gait normal.  Psychiatric:        Mood and Affect: Mood normal.        Behavior: Behavior normal.        Thought Content: Thought content normal.        Judgment: Judgment normal.     Most recent functional status assessment:    08/04/2023   10:41 AM  In your present state of health, do you have any difficulty performing the following activities:  Hearing? 0  Vision? 1  Comment reading glasses  Difficulty concentrating or making decisions? --  Comment remmebering and concentrating  Walking or climbing stairs? 0  Dressing or bathing? 0  Doing errands, shopping? 0   Most recent fall risk assessment:    08/04/2023   10:23 AM  Fall Risk   Falls in the past year? 1  Number falls in past yr: 0  Injury with Fall? 1    Most recent depression screenings:    08/04/2023   10:23 AM 06/30/2023    9:27 AM  PHQ 2/9 Scores  PHQ - 2 Score 2 4  PHQ- 9 Score 10 10   Most recent cognitive screening:    08/04/2023   10:39 AM  6CIT Screen  What Year? 0 points  What month? 0 points  What time? 0 points  Count back from 20 0 points  Months in reverse 0 points  Repeat phrase 2 points  Total Score 2 points   Most recent Audit-C alcohol use screening    06/23/2023   10:18 AM  Alcohol Use Disorder Test (AUDIT)  1. How often do you have a drink containing alcohol? 0  3. How often do you have six or more drinks on one occasion? 0   A score of 3 or more in women, and 4 or more in men indicates increased risk for alcohol abuse, EXCEPT if all of the points are from  question 1   No results found for any visits on 08/04/23.  Assessment & Plan     Annual wellness visit done today including the all of the following: Reviewed patient's Family Medical History Reviewed and updated list of patient's medical providers Assessment of cognitive impairment was done Assessed patient's functional ability Established a written schedule for health screening services Health Risk Assessent Completed and Reviewed  Exercise Activities and Dietary recommendations  Goals   None     Immunization History  Administered Date(s) Administered   PFIZER(Purple Top)SARS-COV-2 Vaccination 09/16/2019, 10/10/2019   Tdap 10/13/2021    Health Maintenance  Topic Date Due   Pneumococcal Vaccine 57-69 Years old (1 of 2 - PCV) Never done   Zoster Vaccines- Shingrix (1 of 2) Never done   Cervical Cancer Screening (HPV/Pap Cotest)  Never done   COVID-19 Vaccine (3 - Pfizer risk series) 11/07/2019   Lung Cancer Screening  12/07/2022   INFLUENZA VACCINE  Never done   Medicare Annual Wellness (AWV)  08/03/2024   MAMMOGRAM  07/13/2025   Colonoscopy  05/22/2028   DTaP/Tdap/Td (2 - Td or Tdap) 10/14/2031   Hepatitis C Screening  Completed   HIV Screening  Completed   HPV VACCINES  Aged Out     Discussed health benefits of physical activity, and encouraged her to engage in regular exercise  appropriate for her age and condition.      Hyperlipidemia Chronic Lp was updated: elevated cholesterol. The 10-year ASCVD risk score (Arnett DK, et  al., 2019) is: 8.8% Patient had stopped taking Atorvastatin due to a refill error. -Resume Atorvastatin. -Check cholesterol levels in 6 weeks.  Subclinical Hypothyroidism Slightly elevated TSH. No current symptoms of hypothyroidism and not on any interfering medications. -Repeat TSH periodically.  Sleep Apnea History of sleep apnea that resolved with weight loss. Recent weight gain and poor REM sleep. Snoring reported by  spouse. -Refer to pulmonology for sleep study.  Cerumen Impaction Complaints of waxy ears, over-the-counter treatments not effective. -Refer to ENT for evaluation and possible cerumen removal.  Preventive Care Up-to-date with eye and dental exams. No recent Pap smear. -Refer to OBGYN for Pap smear and overall gynecological evaluation. -Order lung cancer screening.  Smoking Patient continues to smoke, acknowledges need to quit. -Encourage smoking cessation at each visit.  Follow-up -Return in 6 weeks to reassess cholesterol levels. -Schedule annual wellness visit in 1 year.     Smoking - Ambulatory Referral Lung Cancer Screening [facility]  Snoring - Ambulatory referral to Sleep Studies  Papanicolaou smear for cervical cancer screening Menopause - Ambulatory referral to Gynecology  The patient was advised to call back or seek an in-person evaluation if the symptoms worsen or if the condition fails to improve as anticipated.   I discussed the assessment and treatment plan with the patient. The patient was provided an opportunity to ask questions and all were answered. The patient agreed with the plan and demonstrated an understanding of the instructions.   I, Debera Lat, PA-C have reviewed all documentation for this visit. The documentation on 08/04/2023  for the exam, diagnosis, procedures, and orders are all accurate and complete.  Debera Lat, [facility], MMS [facility] [phone] (phone) [phone] (fax)  [facility]

## 2023-08-08 ENCOUNTER — Encounter: Payer: Self-pay | Admitting: Podiatry

## 2023-08-08 ENCOUNTER — Ambulatory Visit (INDEPENDENT_AMBULATORY_CARE_PROVIDER_SITE_OTHER): Payer: 59 | Admitting: Podiatry

## 2023-08-08 DIAGNOSIS — E559 Vitamin D deficiency, unspecified: Secondary | ICD-10-CM

## 2023-08-08 DIAGNOSIS — M19071 Primary osteoarthritis, right ankle and foot: Secondary | ICD-10-CM | POA: Diagnosis not present

## 2023-08-08 DIAGNOSIS — M21961 Unspecified acquired deformity of right lower leg: Secondary | ICD-10-CM | POA: Diagnosis not present

## 2023-08-08 NOTE — Progress Notes (Signed)
Subjective:  Patient ID: Denise Spencer, female    DOB: [birth year],  MRN: [id number]  Chief Complaint  Patient presents with   Foot Pain    "It's not behaving.  Dr. Al Corpus sent me to see Dr. Lilian Kapur."    63 y.o. female presents with the above complaint. History confirmed with patient.  She returns for follow-up with worsening right foot pain behind the great toe joint.  Has been progressively getting worse and she [place] the toe is shortened and pointing over toward the little toe  Objective:  Physical Exam: warm, good capillary refill, no trophic changes or ulcerative lesions, normal DP and PT pulses, normal sensory exam, and pain with crepitance on range of motion of the first metatarsophalangeal joint, she has prominence of the second and third metatarsal heads and second and third toes are elongated compared to the first   Radiographs: Multiple views x-ray of the right foot: Radiographs of the right foot taken November 2024 show arthritic changes of first MTP, screw in hallux from previous Akin osteotomy her second and third metatarsal heads protrude approximately 1 cm beyond the first metatarsal head Assessment:   1. Arthritis of first metatarsophalangeal (MTP) joint of right foot   2. Vitamin D deficiency   3. Metatarsal deformity, right      Plan:  Patient was evaluated and treated and all questions answered.  I reviewed her previous radiographs and discussed my findings with her and also my clinical findings today of the end-stage arthrosis of the first metatarsal phalangeal joint.  We discussed treatment of this and so far this has not responded well to OTC medications and shoe gear changes and is becoming more painful and swollen each day for her.  I recommended surgical intervention with arthrodesis of the first metatarsal phalangeal joint with bone graft from the heel.  This will also require removal of the Akin screw.  I discussed with her that I expect some shortening  from this as well and due to the shortening from the Lapidus procedure she will need shortening osteotomies of the second and third metatarsals in order to realign the metatarsal parabola as these are already quite prominent and elongated compared to the first.  We discussed the risk benefits and potential complications of this procedure including but not limited to  pain, swelling, infection, scar, numbness which may be temporary or permanent, chronic pain, stiffness, nerve pain or damage, wound healing problems, bone healing problems including delayed or non-union.  We discussed her previous wound healing complications which I suspect likely were due to smoking as well as a recent steroid shot which she has not upcoming and she no longer smokes.  I did recommend checking her vitamin D level and order was given for her to do this today.  We discussed the recovery process including the period of nonweightbearing for 2 to 3 weeks followed by weightbearing in a cam walker boot for 4 to 6 weeks following.  She will follow-up with me after surgery and all questions were addressed, informed consent signed and reviewed.   Surgical plan:  Procedure: -First MTP fusion with autograft from calcaneus, second and third metatarsal phalangeal joint shortening, removal of screw from hallux  Location: -[facility]  Anesthesia plan: -Sedation with regional block  Postoperative pain plan: - Tylenol 1000 mg every 6 hours, ibuprofen 600 mg every 6 hours, gabapentin 300 mg every 8 hours x5 days, oxycodone 5 mg 1-2 tabs every 6 hours only as needed  DVT prophylaxis: -Aspirin  325 mg twice daily  WB Restrictions / DME needs: -Nonweightbearing in CAM boot postop with knee scooter    Return for after surgery.

## 2023-08-09 ENCOUNTER — Ambulatory Visit: Payer: 59 | Admitting: Sleep Medicine

## 2023-08-09 ENCOUNTER — Encounter: Payer: Self-pay | Admitting: Sleep Medicine

## 2023-08-09 ENCOUNTER — Encounter: Payer: Self-pay | Admitting: Physician Assistant

## 2023-08-09 VITALS — BP 112/80 | HR 89 | Temp 96.9°F | Ht 67.0 in | Wt 215.6 lb

## 2023-08-09 DIAGNOSIS — E669 Obesity, unspecified: Secondary | ICD-10-CM | POA: Diagnosis not present

## 2023-08-09 DIAGNOSIS — E559 Vitamin D deficiency, unspecified: Secondary | ICD-10-CM | POA: Diagnosis not present

## 2023-08-09 DIAGNOSIS — G4733 Obstructive sleep apnea (adult) (pediatric): Secondary | ICD-10-CM

## 2023-08-09 NOTE — Patient Instructions (Addendum)
Will complete reassessment of OSA with a home sleep study and follow up to review results. Do not drive drowsy for safety of yourself and others.

## 2023-08-09 NOTE — Progress Notes (Signed)
Name:Denise Spencer MRN: [id number] DOB: 05-25-1961   CHIEF COMPLAINT:  REASSESSMENT OF OSA   HISTORY OF PRESENT ILLNESS:  Denise Spencer is a 63 y.o. w/ a h/o OSA, anxiety, depression and obesity who presents for reassessment of OSA. Reports that she was initially diagnosed with OSA in 2018 and subsequently started on CPAP therapy which she used consistently for 5 years. Reports a 50 lb weight gain over the last few years. Reports feeling more refreshed upon awakening with CPAP therapy. Reports snoring and and excessive daytime sleepiness. Reports nocturnal awakenings due to unclear reasons, however does not have difficulty falling back to sleep. Admits to dry mouth. Denies morning headaches, night sweats, RLS symptoms or dream enactment. Reports a family history of sleep apnea. Denies drowsy driving. Drinks 2 L soda daily, denies alcohol, tobacco or illicit drug use.   Bedtime 10-11 pm Sleep onset 5 mins Rise time 7 am   EPWORTH SLEEP SCORE 1    08/09/2023   10:00 AM  Results of the Epworth flowsheet  Sitting and reading 0  Watching TV 0  Sitting, inactive in a public place (e.g. a theatre or a meeting) 0  As a passenger in a car for an hour without a break 0  Lying down to rest in the afternoon when circumstances permit 1  Sitting and talking to someone 0  Sitting quietly after a lunch without alcohol 0  In a car, while stopped for a few minutes in traffic 0  Total score 1     PAST MEDICAL HISTORY :   has a past medical history of Allergy (1970's), Anxiety, Arthritis, Asthma, Bipolar disorder (HCC), Bulging lumbar disc, COPD (chronic obstructive pulmonary disease) (HCC), Depression, Dyspnea, Fibromyalgia, GERD (gastroesophageal reflux disease), PONV (postoperative nausea and vomiting), Sleep apnea, and Wears dentures.  has a past surgical history that includes Cesarean section; Abdominal hysterectomy w/ partial vaginactomy; Wrist ganglion excision; Colonoscopy;  Colonoscopy with propofol (N/A, 11/01/2016); Esophagogastroduodenoscopy (egd) with propofol (N/A, 11/01/2016); polypectomy (11/01/2016); Septoplasty (Left, 12/27/2016); Cardiac catheterization (2012); Hip Arthroplasty (Left, 04/2020); Colonoscopy with propofol (N/A, 05/22/2021); polypectomy (N/A, 05/22/2021); Tubal ligation; Anterior cervical decomp/discectomy fusion (N/A, 12/15/2022); Joint replacement (2021); Spine surgery (2024); and Abdominal hysterectomy (2002). Prior to Admission medications   Medication Sig Start Date End Date Taking? Authorizing Provider  albuterol (VENTOLIN HFA) 108 (90 Base) MCG/ACT inhaler Inhale 2 puffs into the lungs every 6 (six) hours as needed for wheezing or shortness of breath.   Yes [provider]  atorvastatin (LIPITOR) 10 MG tablet Take 10 mg by mouth daily. 08/05/22  Yes [provider]  Cholecalciferol (VITAMIN D3 MAXIMUM STRENGTH) 125 MCG (5000 UT) capsule Take 5,000 Units by mouth daily.   Yes [provider]  clonazePAM (KLONOPIN) 1 MG tablet Take 1 tablet (1 mg total) by mouth 4 (four) times daily as needed. Patient taking differently: Take 1-2 mg by mouth See admin instructions. Take 2 mg at night, may take a 1 mg dose twice daily as needed for anxiety 10/19/22  Yes [name], Jackquline Denmark, MD  gabapentin (NEURONTIN) 300 MG capsule Take 1,200 mg by mouth 3 (three) times daily. 02/03/22  Yes [provider]  HYDROcodone-acetaminophen (NORCO/VICODIN) 5-325 MG tablet Take 1 tablet by mouth every 6 (six) hours as needed for moderate pain or severe pain. 12/15/22 12/15/23 Yes McKenzie, Eilene Ghazi, PA-C  metaxalone (SKELAXIN) 400 MG tablet Take 400 mg by mouth 3 (three) times daily as needed. 07/31/23  Yes [provider]  nortriptyline (PAMELOR) 50 MG capsule Take 1 capsule (50 mg total) by mouth at bedtime. 10/19/22  Yes [name], Jackquline Denmark, MD  pantoprazole (PROTONIX) 40 MG tablet Take 40 mg by mouth daily. 12/14/21  Yes [provider]  sertraline (ZOLOFT) 100 MG tablet Take 1.5 tablets (150 mg total) by mouth daily. 10/19/22  Yes [name], Jackquline Denmark, MD  traMADol (ULTRAM) 50 MG tablet Take 50 mg by mouth every 6 (six) hours as needed for moderate pain.   Yes [provider]   Allergies  Allergen Reactions   Advair Diskus [Fluticasone-Salmeterol]     Caused thrush   Aspirin     Muscle and joint pain   Breo Ellipta [Fluticasone Furoate-Vilanterol]     Caused thrush   Nsaids Nausea And Vomiting   Tape     Some adhesives cause blisters.  Tegaderm is OK.   Amoxicillin Rash   Doxycycline Rash   Erythromycin Rash   Penicillins Rash   Sulfa Antibiotics Rash         FAMILY HISTORY:  family history includes Alcohol abuse in her sister; Anxiety disorder in her mother, sister, and sister; Arthritis in her father and mother; Asthma in her sister; Bipolar disorder in her mother, sister, sister, and sister; Breast cancer (age of onset: 13) in her mother; Cancer in her father, mother, and sister; Depression in her mother and sister; Early death in her sister; Heart attack in her father; Heart disease in her father; Hypertension in her father, sister, and sister; Learning disabilities in her sister; Lymphoma in her sister; Miscarriages / Stillbirths in her sister and sister; Multiple sclerosis in her sister and another family member; Obesity in her mother and sister; Thyroid cancer in her father; Varicose Veins in her mother. SOCIAL HISTORY:  reports that she quit smoking about 10 months ago. Her smoking use included cigarettes. She started smoking about 46 years ago. She has a 22.7 pack-year smoking history. She has never used smokeless tobacco. She reports that she does not drink alcohol and does not use drugs.   Review of Systems:  Gen:  Denies  fever, sweats, chills weight loss  HEENT: Denies blurred vision, double vision, ear pain, eye pain, hearing loss, nose bleeds, sore throat Cardiac:  No dizziness,  chest pain or heaviness, chest tightness,edema, No JVD Resp:   No cough, -sputum production, -shortness of breath,-wheezing, -hemoptysis,  Gi: Denies swallowing difficulty, stomach pain, nausea or vomiting, diarrhea, constipation, bowel incontinence Gu:  Denies bladder incontinence, burning urine Ext:   Denies Joint pain, stiffness or swelling Skin: Denies  skin rash, easy bruising or bleeding or hives Endoc:  Denies polyuria, polydipsia , polyphagia or weight change Psych:   Denies depression, insomnia or hallucinations  Other:  All other systems negative  VITAL SIGNS: BP 112/80 (BP Location: Right Arm, Cuff Size: Normal)   Pulse 89   Temp (!) 96.9 F (36.1 C)   Ht 5\' 7"  (1.702 m)   Wt 215 lb 9.6 oz (97.8 kg)   SpO2 100%   BMI 33.77 kg/m    Physical Examination:   General Appearance: No distress  EYES PERRLA, EOM intact.   NECK Supple, No JVD Pulmonary: normal breath sounds, No wheezing.  CardiovascularNormal S1,S2.  No m/r/g.   Abdomen: Benign, Soft, non-tender. Skin:   warm, no rashes, no ecchymosis  Extremities: normal, no cyanosis, clubbing. Neuro:without focal findings,  speech normal  PSYCHIATRIC: Mood, affect within normal limits.   ASSESSMENT AND PLAN  OSA  I suspect that OSA is worsened secondary to weight gain. likely present due to clinical presentation. Discussed the consequences of untreated sleep apnea. Advised not to drive drowsy for safety of patient and others. Will complete further evaluation with a home sleep study and follow up to review results.    Obesity Counseled patient on diet and lifestyle modification.     MEDICATION ADJUSTMENTS/LABS AND TESTS ORDERED: Recommend Sleep Study   Patient  satisfied with Plan of action and management. All questions answered  Follow up to review HST results and treatment plan.   I spent a total of 36 minutes reviewing chart data, face-to-face evaluation with the patient, counseling and coordination of care  as detailed above.    Tempie Hoist, M.D.  Sleep Medicine [facility]

## 2023-08-10 LAB — VITAMIN D 25 HYDROXY (VIT D DEFICIENCY, FRACTURES): Vit D, 25-Hydroxy: 44.8 ng/mL (ref 30.0–100.0)

## 2023-08-15 ENCOUNTER — Encounter: Payer: Self-pay | Admitting: Podiatry

## 2023-08-18 ENCOUNTER — Encounter: Payer: Self-pay | Admitting: Physician Assistant

## 2023-08-21 DIAGNOSIS — G4730 Sleep apnea, unspecified: Secondary | ICD-10-CM | POA: Diagnosis not present

## 2023-08-22 ENCOUNTER — Telehealth: Payer: Self-pay | Admitting: Sleep Medicine

## 2023-08-22 DIAGNOSIS — Z91199 Patient's noncompliance with other medical treatment and regimen due to unspecified reason: Secondary | ICD-10-CM | POA: Diagnosis not present

## 2023-08-22 DIAGNOSIS — G894 Chronic pain syndrome: Secondary | ICD-10-CM | POA: Diagnosis not present

## 2023-08-22 DIAGNOSIS — Z7289 Other problems related to lifestyle: Secondary | ICD-10-CM | POA: Diagnosis not present

## 2023-08-22 NOTE — Telephone Encounter (Signed)
 Dr. Elvera Lennox wanted to know when she mailed back her HST. States she mailed it today. TY.

## 2023-08-26 DIAGNOSIS — M654 Radial styloid tenosynovitis [de Quervain]: Secondary | ICD-10-CM | POA: Diagnosis not present

## 2023-08-31 ENCOUNTER — Encounter

## 2023-08-31 DIAGNOSIS — G4733 Obstructive sleep apnea (adult) (pediatric): Secondary | ICD-10-CM

## 2023-08-31 DIAGNOSIS — R069 Unspecified abnormalities of breathing: Secondary | ICD-10-CM | POA: Diagnosis not present

## 2023-09-02 ENCOUNTER — Ambulatory Visit: Admitting: Sleep Medicine

## 2023-09-05 ENCOUNTER — Encounter: Payer: Self-pay | Admitting: Sleep Medicine

## 2023-09-05 ENCOUNTER — Ambulatory Visit: Admitting: Sleep Medicine

## 2023-09-05 VITALS — BP 120/82 | HR 83 | Temp 96.9°F | Ht 67.0 in | Wt 218.0 lb

## 2023-09-05 DIAGNOSIS — G4733 Obstructive sleep apnea (adult) (pediatric): Secondary | ICD-10-CM | POA: Diagnosis not present

## 2023-09-05 DIAGNOSIS — E669 Obesity, unspecified: Secondary | ICD-10-CM | POA: Diagnosis not present

## 2023-09-05 DIAGNOSIS — I10 Essential (primary) hypertension: Secondary | ICD-10-CM

## 2023-09-05 DIAGNOSIS — Z6834 Body mass index (BMI) 34.0-34.9, adult: Secondary | ICD-10-CM | POA: Diagnosis not present

## 2023-09-05 NOTE — Progress Notes (Signed)
 Name:Denise Spencer MRN: [id number] DOB: 1961-05-24   CHIEF COMPLAINT:  EXCESSIVE DAYTIME SLEEPINESS   HISTORY OF PRESENT ILLNESS:  Denise Spencer is a 63 y.o. w/ a h/o OSA, anxiety, depression and obesity who presents to follow up on HST results. The patient underwent HST which revealed moderate OSA (AHI 23, O2 nadir 70%).   EPWORTH SLEEP SCORE 1    08/09/2023   10:00 AM  Results of the Epworth flowsheet  Sitting and reading 0  Watching TV 0  Sitting, inactive in a public place (e.g. a theatre or a meeting) 0  As a passenger in a car for an hour without a break 0  Lying down to rest in the afternoon when circumstances permit 1  Sitting and talking to someone 0  Sitting quietly after a lunch without alcohol 0  In a car, while stopped for a few minutes in traffic 0  Total score 1    PAST MEDICAL HISTORY :   has a past medical history of Allergy (1970's), Anxiety, Arthritis, Asthma, Bipolar disorder (HCC), Bulging lumbar disc, COPD (chronic obstructive pulmonary disease) (HCC), Depression, Dyspnea, Fibromyalgia, GERD (gastroesophageal reflux disease), PONV (postoperative nausea and vomiting), Sleep apnea, and Wears dentures.  has a past surgical history that includes Cesarean section; Abdominal hysterectomy w/ partial vaginactomy; Wrist ganglion excision; Colonoscopy; Colonoscopy with propofol (N/A, 11/01/2016); Esophagogastroduodenoscopy (egd) with propofol (N/A, 11/01/2016); polypectomy (11/01/2016); Septoplasty (Left, 12/27/2016); Cardiac catheterization (2012); Hip Arthroplasty (Left, 04/2020); Colonoscopy with propofol (N/A, 05/22/2021); polypectomy (N/A, 05/22/2021); Tubal ligation; Anterior cervical decomp/discectomy fusion (N/A, 12/15/2022); Joint replacement (2021); Spine surgery (2024); and Abdominal hysterectomy (2002). Prior to Admission medications   Medication Sig Start Date End Date Taking? Authorizing Provider  albuterol (VENTOLIN HFA) 108 (90 Base) MCG/ACT  inhaler Inhale 2 puffs into the lungs every 6 (six) hours as needed for wheezing or shortness of breath.   Yes [provider]  atorvastatin (LIPITOR) 10 MG tablet Take 10 mg by mouth daily. 08/05/22  Yes [provider]  Cholecalciferol (VITAMIN D3 MAXIMUM STRENGTH) 125 MCG (5000 UT) capsule Take 5,000 Units by mouth daily.   Yes [provider]  clonazePAM (KLONOPIN) 1 MG tablet Take 1 tablet (1 mg total) by mouth 4 (four) times daily as needed. Patient taking differently: Take 1-2 mg by mouth See admin instructions. Take 2 mg at night, may take a 1 mg dose twice daily as needed for anxiety 10/19/22  Yes [name], Jackquline Denmark, MD  gabapentin (NEURONTIN) 300 MG capsule Take 1,200 mg by mouth 3 (three) times daily. 02/03/22  Yes [provider]  HYDROcodone-acetaminophen (NORCO/VICODIN) 5-325 MG tablet Take 1 tablet by mouth every 6 (six) hours as needed for moderate pain or severe pain. 12/15/22 12/15/23 Yes McKenzie, Eilene Ghazi, PA-C  metaxalone (SKELAXIN) 800 MG tablet Take 800 mg by mouth 3 (three) times daily as needed. 08/27/23  Yes [provider]  nortriptyline (PAMELOR) 50 MG capsule Take 1 capsule (50 mg total) by mouth at bedtime. 10/19/22  Yes [name], Jackquline Denmark, MD  pantoprazole (PROTONIX) 40 MG tablet Take 40 mg by mouth daily. 12/14/21  Yes [provider]  QUEtiapine (SEROQUEL XR) 50 MG TB24 24 hr tablet Take 50 mg by mouth at bedtime. 08/11/23  Yes [provider]  sertraline (ZOLOFT) 100 MG tablet Take 1.5 tablets (150 mg total) by mouth daily. 10/19/22  Yes [name], Jackquline Denmark, MD  traMADol (ULTRAM) 50 MG tablet Take 50 mg by mouth every 6 (six)  hours as needed for moderate pain.   Yes [provider]   Allergies  Allergen Reactions   Advair Diskus [Fluticasone-Salmeterol]     Caused thrush   Aspirin     Muscle and joint pain   Breo Ellipta [Fluticasone Furoate-Vilanterol]     Caused thrush   Nsaids Nausea And Vomiting   Tape      Some adhesives cause blisters.  Tegaderm is OK.   Amoxicillin Rash   Doxycycline Rash   Erythromycin Rash   Penicillins Rash   Sulfa Antibiotics Rash         FAMILY HISTORY:  family history includes Alcohol abuse in her sister; Anxiety disorder in her mother, sister, and sister; Arthritis in her father and mother; Asthma in her sister; Bipolar disorder in her mother, sister, sister, and sister; Breast cancer (age of onset: 20) in her mother; Cancer in her father, mother, and sister; Depression in her mother and sister; Early death in her sister; Heart attack in her father; Heart disease in her father; Hypertension in her father, sister, and sister; Learning disabilities in her sister; Lymphoma in her sister; Miscarriages / Stillbirths in her sister and sister; Multiple sclerosis in her sister and another family member; Obesity in her mother and sister; Thyroid cancer in her father; Varicose Veins in her mother. SOCIAL HISTORY:  reports that she quit smoking about a year ago. Her smoking use included cigarettes. She started smoking about 46 years ago. She has a 22.7 pack-year smoking history. She has never used smokeless tobacco. She reports that she does not drink alcohol and does not use drugs.   Review of Systems:  Gen:  Denies  fever, sweats, chills weight loss  HEENT: Denies blurred vision, double vision, ear pain, eye pain, hearing loss, nose bleeds, sore throat Cardiac:  No dizziness, chest pain or heaviness, chest tightness,edema, No JVD Resp:   No cough, -sputum production, -shortness of breath,-wheezing, -hemoptysis,  Gi: Denies swallowing difficulty, stomach pain, nausea or vomiting, diarrhea, constipation, bowel incontinence Gu:  Denies bladder incontinence, burning urine Ext:   Denies Joint pain, stiffness or swelling Skin: Denies  skin rash, easy bruising or bleeding or hives Endoc:  Denies polyuria, polydipsia , polyphagia or weight change Psych:   Denies depression, insomnia  or hallucinations  Other:  All other systems negative  VITAL SIGNS: BP 120/82 (BP Location: Right Arm, Cuff Size: Large)   Pulse 83   Temp (!) 96.9 F (36.1 C)   Ht 5\' 7"  (1.702 m)   Wt 218 lb (98.9 kg)   SpO2 96%   BMI 34.14 kg/m    Physical Examination:   General Appearance: No distress  EYES PERRLA, EOM intact.   NECK Supple, No JVD Pulmonary: normal breath sounds, No wheezing.  CardiovascularNormal S1,S2.  No m/r/g.   Abdomen: Benign, Soft, non-tender. Skin:   warm, no rashes, no ecchymosis  Extremities: normal, no cyanosis, clubbing. Neuro:without focal findings,  speech normal  PSYCHIATRIC: Mood, affect within normal limits.   ASSESSMENT AND PLAN  OSA Reviewed HST results with patient. For moderate OSA, starting patient on APAP therapy set to 4-16 cm H2O. Discussed the consequences of untreated sleep apnea. Advised not to drive drowsy for safety of patient and others. Will follow up in 3 months to review CPAP efficacy and compliance data.    HTN Stable, on current management. Following with PCP.    Patient  satisfied with Plan of action and management. All questions answered  Follow up to review  CPAP efficacy and compliance data.    I spent a total of 22 minutes reviewing chart data, face-to-face evaluation with the patient, counseling and coordination of care as detailed above.    Tempie Hoist, M.D.  Sleep Medicine [facility]

## 2023-09-05 NOTE — Patient Instructions (Signed)
 Patient Instructions  Continue to use CPAP every night, minimum of 7-8 hours a night.  Change equipment every 30 days or as directed by DME.  Wash your tubing with warm soap and water weekly, hang to dry. Wash humidifier portion weekly. Use bottled, distilled water and change daily   Be aware of reduced alertness and do not drive or operate heavy machinery if experiencing this or drowsiness.  Exercise encouraged, as tolerated. Encouraged proper weight management.  Important to get eight or more hours of sleep  Limiting the use of the computer and television before bedtime.  Decrease naps during the day, so night time sleep will become enhanced.  Limit caffeine, and sleep deprivation.  HTN, stroke, uncontrolled diabetes and heart failure are potential risk factors.  Risk of untreated sleep apnea including cardiac arrhthymias, stroke, DM, pulm HTN.

## 2023-09-08 ENCOUNTER — Encounter: Payer: Self-pay | Admitting: Physician Assistant

## 2023-09-12 ENCOUNTER — Ambulatory Visit (INDEPENDENT_AMBULATORY_CARE_PROVIDER_SITE_OTHER): Admitting: Podiatry

## 2023-09-12 ENCOUNTER — Encounter: Payer: Self-pay | Admitting: Podiatry

## 2023-09-12 DIAGNOSIS — M19071 Primary osteoarthritis, right ankle and foot: Secondary | ICD-10-CM

## 2023-09-12 DIAGNOSIS — D2371 Other benign neoplasm of skin of right lower limb, including hip: Secondary | ICD-10-CM

## 2023-09-12 DIAGNOSIS — M19072 Primary osteoarthritis, left ankle and foot: Secondary | ICD-10-CM | POA: Diagnosis not present

## 2023-09-12 DIAGNOSIS — D2372 Other benign neoplasm of skin of left lower limb, including hip: Secondary | ICD-10-CM | POA: Diagnosis not present

## 2023-09-12 NOTE — Progress Notes (Signed)
 She presents today for follow-up of painful lesion plantar aspect of the first metatarsophalangeal joint left foot and a lesion plantar aspect of the forefoot right.  Objective: Vital signs stable alert oriented x 3 evaluation of these lesions today demonstrate no verrucoid lesions however they do not appear to be benign skin lesions which I debrided today with a 15 blade with no iatrogenic lesions.  Assessment: Pain in limb secondary to hammertoe deformity capsulitis metatarsalgia.  Benign skin lesions bilateral.  Plan: Dispensed rigid graphite insoles.  Debrided benign skin lesions.

## 2023-09-14 NOTE — Telephone Encounter (Signed)
 NFN

## 2023-09-14 NOTE — Progress Notes (Deleted)
 Established patient visit  Patient: Denise Spencer   DOB: March 29, 1961   63 y.o. Female  MRN: [id number] Visit Date: 09/15/2023  Today's healthcare provider: Debera Lat, PA-C   No chief complaint on file.  Subjective       Discussed the use of AI scribe software for clinical note transcription with the patient, who gave verbal consent to proceed.  History of Present Illness        08/04/2023   10:23 AM 06/30/2023    9:27 AM  Depression screen PHQ 2/9  Decreased Interest 2 2  Down, Depressed, Hopeless 0 2  PHQ - 2 Score 2 4  Altered sleeping 2 2  Tired, decreased energy 3 3  Change in appetite 0 0  Feeling bad or failure about yourself  0 0  Trouble concentrating 3 1  Moving slowly or fidgety/restless 0 0  Suicidal thoughts 0 0  PHQ-9 Score 10 10  Difficult doing work/chores Not difficult at all Not difficult at all      08/04/2023   10:23 AM 06/30/2023    9:27 AM  GAD 7 : Generalized Anxiety Score  Nervous, Anxious, on Edge 2 2  Control/stop worrying 0 0  Worry too much - different things 2 0  Trouble relaxing 3 2  Restless 0 0  Easily annoyed or irritable 3 2  Afraid - awful might happen 0 0  Total GAD 7 Score 10 6  Anxiety Difficulty Not difficult at all Somewhat difficult    Medications: Outpatient Medications Prior to Visit  Medication Sig   albuterol (VENTOLIN HFA) 108 (90 Base) MCG/ACT inhaler Inhale 2 puffs into the lungs every 6 (six) hours as needed for wheezing or shortness of breath.   atorvastatin (LIPITOR) 10 MG tablet Take 10 mg by mouth daily.   Cholecalciferol (VITAMIN D3 MAXIMUM STRENGTH) 125 MCG (5000 UT) capsule Take 5,000 Units by mouth daily.   clonazePAM (KLONOPIN) 1 MG tablet Take 1 tablet (1 mg total) by mouth 4 (four) times daily as needed. (Patient taking differently: Take 1-2 mg by mouth See admin instructions. Take 2 mg at night, may take a 1 mg dose twice daily as needed for anxiety)   gabapentin (NEURONTIN) 300 MG capsule Take  1,200 mg by mouth 3 (three) times daily.   HYDROcodone-acetaminophen (NORCO/VICODIN) 5-325 MG tablet Take 1 tablet by mouth every 6 (six) hours as needed for moderate pain or severe pain.   metaxalone (SKELAXIN) 800 MG tablet Take 800 mg by mouth 3 (three) times daily as needed.   nortriptyline (PAMELOR) 50 MG capsule Take 1 capsule (50 mg total) by mouth at bedtime.   pantoprazole (PROTONIX) 40 MG tablet Take 40 mg by mouth daily.   QUEtiapine (SEROQUEL XR) 50 MG TB24 24 hr tablet Take 50 mg by mouth at bedtime.   sertraline (ZOLOFT) 100 MG tablet Take 1.5 tablets (150 mg total) by mouth daily.   traMADol (ULTRAM) 50 MG tablet Take 50 mg by mouth every 6 (six) hours as needed for moderate pain.   No facility-administered medications prior to visit.    Review of Systems  All other systems reviewed and are negative.  All negative Except see HPI   {Insert previous labs (optional):23779} {See past labs  Heme  Chem  Endocrine  Serology  Results Review (optional):1}   Objective    There were no vitals taken for this visit. {Insert last BP/Wt (optional):23777}{See vitals history (optional):1}   Physical Exam Vitals reviewed.  Constitutional:  General: She is not in acute distress.    Appearance: Normal appearance. She is well-developed. She is not diaphoretic.  HENT:     Head: Normocephalic and atraumatic.  Eyes:     General: No scleral icterus.    Conjunctiva/sclera: Conjunctivae normal.  Neck:     Thyroid: No thyromegaly.  Cardiovascular:     Rate and Rhythm: Normal rate and regular rhythm.     Pulses: Normal pulses.     Heart sounds: Normal heart sounds. No murmur heard. Pulmonary:     Effort: Pulmonary effort is normal. No respiratory distress.     Breath sounds: Normal breath sounds. No wheezing, rhonchi or rales.  Musculoskeletal:     Cervical back: Neck supple.     Right lower leg: No edema.     Left lower leg: No edema.  Lymphadenopathy:     Cervical: No  cervical adenopathy.  Skin:    General: Skin is warm and dry.     Findings: No rash.  Neurological:     Mental Status: She is alert and oriented to person, place, and time. Mental status is at baseline.  Psychiatric:        Mood and Affect: Mood normal.        Behavior: Behavior normal.      No results found for any visits on 09/15/23.      Assessment and Plan Assessment & Plan     No orders of the defined types were placed in this encounter.   No follow-ups on file.   The patient was advised to call back or seek an in-person evaluation if the symptoms worsen or if the condition fails to improve as anticipated.  I discussed the assessment and treatment plan with the patient. The patient was provided an opportunity to ask questions and all were answered. The patient agreed with the plan and demonstrated an understanding of the instructions.  I, Debera Lat, PA-C have reviewed all documentation for this visit. The documentation on 09/15/2023  for the exam, diagnosis, procedures, and orders are all accurate and complete.  Debera Lat, [facility], MMS [facility] [phone] (phone) [phone] (fax)  [facility]

## 2023-09-15 ENCOUNTER — Ambulatory Visit: Payer: 59 | Admitting: Physician Assistant

## 2023-09-15 DIAGNOSIS — H6123 Impacted cerumen, bilateral: Secondary | ICD-10-CM

## 2023-09-15 DIAGNOSIS — G4730 Sleep apnea, unspecified: Secondary | ICD-10-CM

## 2023-09-15 DIAGNOSIS — E7849 Other hyperlipidemia: Secondary | ICD-10-CM

## 2023-09-15 DIAGNOSIS — F17200 Nicotine dependence, unspecified, uncomplicated: Secondary | ICD-10-CM

## 2023-09-15 DIAGNOSIS — Z780 Asymptomatic menopausal state: Secondary | ICD-10-CM

## 2023-09-15 DIAGNOSIS — E0590 Thyrotoxicosis, unspecified without thyrotoxic crisis or storm: Secondary | ICD-10-CM

## 2023-09-22 DIAGNOSIS — G894 Chronic pain syndrome: Secondary | ICD-10-CM | POA: Diagnosis not present

## 2023-09-22 DIAGNOSIS — Z7289 Other problems related to lifestyle: Secondary | ICD-10-CM | POA: Diagnosis not present

## 2023-09-22 DIAGNOSIS — Z91199 Patient's noncompliance with other medical treatment and regimen due to unspecified reason: Secondary | ICD-10-CM | POA: Diagnosis not present

## 2023-09-23 ENCOUNTER — Telehealth: Payer: Self-pay | Admitting: Podiatry

## 2023-09-23 NOTE — Telephone Encounter (Signed)
 DOS: 10/14/23  MET. OSTEOTOMY X2 RT [phone]) HALLUX MPJ FUSIN RT (13244) BONE GRAFT RT (20900)    EFFECTIVE DATE: 06/22/23  DEDUCTIBLE: $257.00   REMAINING: $0.00  OOP: $9,350.00   REMAINING:  $9,052.37  CO INSURANCE : 20%   PER THE [employer] WEBSITE NO PRIOR AUTH IS REQ FOR CPT CODES 28308,28750,20900  REF# [id number]

## 2023-09-27 ENCOUNTER — Other Ambulatory Visit: Payer: Self-pay | Admitting: Physician Assistant

## 2023-09-27 DIAGNOSIS — G4733 Obstructive sleep apnea (adult) (pediatric): Secondary | ICD-10-CM | POA: Diagnosis not present

## 2023-09-27 NOTE — Telephone Encounter (Signed)
 Copied from CRM [id number]. Topic: Clinical - Medication Refill >> Sep 27, 2023 12:49 PM DeAngela L wrote: Most Recent Primary Care Visit:  Provider: Debera Lat  Department: [facility]  Visit Type: PHYSICAL  Date: 08/04/2023  Medication: pantoprazole (PROTONIX) 40 MG tablet  Has the patient contacted their pharmacy? Yes pharmacy said the refill was denied  (Agent: If no, request that the patient contact the pharmacy for the refill. If patient does not wish to contact the pharmacy document the reason why and proceed with request.) (Agent: If yes, when and what did the pharmacy advise?)  Is this the correct pharmacy for this prescription? Yes If no, delete pharmacy and type the correct one.  This is the patient's preferred pharmacy:  [id number] [place], [place] - [address] AT [facility] OF [address] [place] [address] Phone: [phone] Fax: [phone]   Has the prescription been filled recently? Yes  Is the patient out of the medication? No  Has the patient been seen for an appointment in the last year OR does the patient have an upcoming appointment? Yes  Can we respond through MyChart? Yes  Agent: Please be advised that Rx refills may take up to 3 business days. We ask that you follow-up with your pharmacy.

## 2023-09-28 MED ORDER — PANTOPRAZOLE SODIUM 40 MG PO TBEC: 40.0000 mg | DELAYED_RELEASE_TABLET | Freq: Every day | ORAL | 3 refills | Status: DC

## 2023-09-28 NOTE — Telephone Encounter (Signed)
 Requested medication (s) are due for refill today: ?  Requested medication (s) are on the active medication list: historical med  Last refill:  12/14/21  Future visit scheduled: yes  Notes to clinic:  historical med and provider   Requested Prescriptions  Pending Prescriptions Disp Refills   pantoprazole (PROTONIX) 40 MG tablet      Sig: Take 1 tablet (40 mg total) by mouth daily.     Gastroenterology: Proton Pump Inhibitors Passed - 09/28/2023 12:54 PM      Passed - Valid encounter within last 12 months    Recent Outpatient Visits           1 month ago Medicare annual wellness visit, subsequent   [place] [facility] [place], [place], [place]       Future Appointments             In 10 months Ostwalt, Edmon Crape, PA-C [facility]

## 2023-09-29 ENCOUNTER — Telehealth: Payer: Self-pay | Admitting: *Deleted

## 2023-09-29 ENCOUNTER — Other Ambulatory Visit: Payer: Self-pay | Admitting: *Deleted

## 2023-09-29 ENCOUNTER — Encounter: Payer: Self-pay | Admitting: Physician Assistant

## 2023-09-29 ENCOUNTER — Ambulatory Visit (INDEPENDENT_AMBULATORY_CARE_PROVIDER_SITE_OTHER): Admitting: Physician Assistant

## 2023-09-29 VITALS — BP 110/75 | HR 80 | Resp 16 | Ht 67.0 in | Wt 216.8 lb

## 2023-09-29 DIAGNOSIS — E7849 Other hyperlipidemia: Secondary | ICD-10-CM | POA: Diagnosis not present

## 2023-09-29 DIAGNOSIS — G4730 Sleep apnea, unspecified: Secondary | ICD-10-CM

## 2023-09-29 DIAGNOSIS — R7303 Prediabetes: Secondary | ICD-10-CM

## 2023-09-29 DIAGNOSIS — G894 Chronic pain syndrome: Secondary | ICD-10-CM | POA: Diagnosis not present

## 2023-09-29 DIAGNOSIS — M797 Fibromyalgia: Secondary | ICD-10-CM | POA: Diagnosis not present

## 2023-09-29 DIAGNOSIS — M21969 Unspecified acquired deformity of unspecified lower leg: Secondary | ICD-10-CM | POA: Diagnosis not present

## 2023-09-29 DIAGNOSIS — F17210 Nicotine dependence, cigarettes, uncomplicated: Secondary | ICD-10-CM

## 2023-09-29 DIAGNOSIS — Z87891 Personal history of nicotine dependence: Secondary | ICD-10-CM

## 2023-09-29 DIAGNOSIS — Z122 Encounter for screening for malignant neoplasm of respiratory organs: Secondary | ICD-10-CM | POA: Diagnosis not present

## 2023-09-29 NOTE — Progress Notes (Signed)
 Established patient visit  Patient: Denise Spencer   DOB: 28-Jan-1961   63 y.o. Female  MRN: [id number] Visit Date: 09/29/2023  Today's healthcare provider: Blane Bunting, PA-C   Chief Complaint  Patient presents with   Follow-up    F/u on Cholesterol Pt will be getting MMR @ [facility]..   Subjective      Discussed the use of AI scribe software for clinical note transcription with the patient, who gave verbal consent to proceed.  History of Present Illness The patient, with a history of high cholesterol, sleep apnea, and smoking, presents with concerns about her health. She has been proactive in managing her conditions, including starting on a CPAP machine for sleep apnea, which has significantly improved her symptoms. She reports feeling less foggy and more alert since starting the treatment.  The patient also discusses her smoking habits, admitting to smoking half a pack a day. She expresses concern about her cholesterol levels and is scheduled for a cholesterol check. She has been taking medication for this and expects her levels to have decreased.  The patient is also pre-diabetic and is making lifestyle changes to manage this. She has lost eleven pounds and is continuing to make changes to her diet and exercise habits. She expresses a desire to avoid medication for this condition if possible.  She expresses a desire to continue making health improvements during this time.  The patient also discusses potential vaccinations, including measles, mumps, rubella, and pneumonia. She has not received these vaccinations in the past and is considering getting them due to current health concerns.       09/29/2023    2:01 PM 08/04/2023   10:23 AM 06/30/2023    9:27 AM  Depression screen PHQ 2/9  Decreased Interest 0 2 2  Down, Depressed, Hopeless 0 0 2  PHQ - 2 Score 0 2 4  Altered sleeping 2 2 2   Tired, decreased energy 2 3 3   Change in appetite 0 0 0  Feeling bad or failure about  yourself   0 0  Trouble concentrating 0 3 1  Moving slowly or fidgety/restless 0 0 0  Suicidal thoughts 0 0 0  PHQ-9 Score 4 10 10   Difficult doing work/chores Somewhat difficult Not difficult at all Not difficult at all      09/29/2023    2:02 PM 08/04/2023   10:23 AM 06/30/2023    9:27 AM  GAD 7 : Generalized Anxiety Score  Nervous, Anxious, on Edge 1 2 2   Control/stop worrying 0 0 0  Worry too much - different things 0 2 0  Trouble relaxing 0 3 2  Restless 0 0 0  Easily annoyed or irritable 0 3 2  Afraid - awful might happen 0 0 0  Total GAD 7 Score 1 10 6   Anxiety Difficulty Somewhat difficult Not difficult at all Somewhat difficult    Medications: Outpatient Medications Prior to Visit  Medication Sig   albuterol (VENTOLIN HFA) 108 (90 Base) MCG/ACT inhaler Inhale 2 puffs into the lungs every 6 (six) hours as needed for wheezing or shortness of breath.   atorvastatin (LIPITOR) 10 MG tablet Take 10 mg by mouth daily.   Cholecalciferol (VITAMIN D3 MAXIMUM STRENGTH) 125 MCG (5000 UT) capsule Take 5,000 Units by mouth daily.   clonazePAM (KLONOPIN) 1 MG tablet Take 1 tablet (1 mg total) by mouth 4 (four) times daily as needed. (Patient taking differently: Take 1-2 mg by mouth See admin instructions. Take 2 mg at  night, may take a 1 mg dose twice daily as needed for anxiety)   gabapentin (NEURONTIN) 300 MG capsule Take 1,200 mg by mouth 3 (three) times daily.   HYDROcodone-acetaminophen (NORCO/VICODIN) 5-325 MG tablet Take 1 tablet by mouth every 6 (six) hours as needed for moderate pain or severe pain.   metaxalone (SKELAXIN) 800 MG tablet Take 800 mg by mouth 3 (three) times daily as needed.   nortriptyline (PAMELOR) 50 MG capsule Take 1 capsule (50 mg total) by mouth at bedtime.   pantoprazole (PROTONIX) 40 MG tablet Take 1 tablet (40 mg total) by mouth daily.   QUEtiapine (SEROQUEL XR) 50 MG TB24 24 hr tablet Take 50 mg by mouth at bedtime.   sertraline (ZOLOFT) 100 MG tablet Take  1.5 tablets (150 mg total) by mouth daily.   traMADol (ULTRAM) 50 MG tablet Take 50 mg by mouth every 6 (six) hours as needed for moderate pain.   No facility-administered medications prior to visit.    Review of Systems All negative Except see HPI       Objective    BP 110/75 (BP Location: Left Arm, Patient Position: Sitting, Cuff Size: Normal)   Pulse 80   Resp 16   Ht 5\' 7"  (1.702 m)   Wt 216 lb 12.8 oz (98.3 kg)   SpO2 100%   BMI 33.96 kg/m     Physical Exam Vitals reviewed.  Constitutional:      General: She is not in acute distress.    Appearance: Normal appearance. She is well-developed. She is not diaphoretic.  HENT:     Head: Normocephalic and atraumatic.  Eyes:     General: No scleral icterus.    Conjunctiva/sclera: Conjunctivae normal.  Neck:     Thyroid: No thyromegaly.  Cardiovascular:     Rate and Rhythm: Normal rate and regular rhythm.     Pulses: Normal pulses.     Heart sounds: Normal heart sounds. No murmur heard. Pulmonary:     Effort: Pulmonary effort is normal. No respiratory distress.     Breath sounds: Normal breath sounds. No wheezing, rhonchi or rales.  Musculoskeletal:     Cervical back: Neck supple.     Right lower leg: No edema.     Left lower leg: No edema.  Lymphadenopathy:     Cervical: No cervical adenopathy.  Skin:    General: Skin is warm and dry.     Findings: No rash.  Neurological:     Mental Status: She is alert and oriented to person, place, and time. Mental status is at baseline.  Psychiatric:        Mood and Affect: Mood normal.        Behavior: Behavior normal.      No results found for any visits on 09/29/23.      Assessment & Plan Foot Deformity and Capsulitis Scheduled for foot surgery in two weeks due to significant pain and failed conservative management. - Prepare for foot surgery in two weeks. - Ensure non-weight bearing for three weeks post-surgery.  Obstructive Sleep Apnea Recently started CPAP  with significant symptom improvement. Prefers nasal mask due to claustrophobia. - Continue using CPAP machine with nasal mask.  Hyperlipidemia chronic Cholesterol levels elevated. Ldl was 196 The 10-year ASCVD risk score (Arnett DK, et al., 2019) is: 2.3% Agreed to increase atorvastatin to 20 mg. - Increase atorvastatin dose to 20 mg. - Check cholesterol levels. Will follow-up  Prediabetes chronic A1c in prediabetic range from 07/25/23 Discussed  monitoring and management to prevent diabetes. Provided ADA resources. Prefers lifestyle changes over metformin. - Monitor blood glucose levels. - Refer to American Diabetes Association website for resources.  Obesity Chronic Body mass index is 33.96 kg/m. Weight loss of 5% of pt's current weight via healthy diet and daily exercise encouraged. Labs ordered. Will reassess  General Health Maintenance Due for vaccinations and screenings. MMR and pneumonia vaccines planned. Lung cancer screening recommended. Pap smear needed; advised to find new gynecologist and set up voicemail. - Administer MMR vaccine. - Administer pneumonia vaccine. - Schedule lung cancer screening. - Find a new gynecologist for Pap smear. - Ensure voicemail is set up for appointment communications.  Follow-up Non-weight bearing for three weeks post-surgery. Follow-up in three months, possibly via video. Encouraged lifestyle changes and weight loss. - Schedule follow-up visit in three months, potentially via video. Ob gyn referral was closed.  Screening for lung cancer (Primary)  - Ambulatory Referral for Lung Cancer Scre - Lipid panel - CBC with Differential/Platelet - Comprehensive metabolic panel with GFR - TSH  Obesity   - Lipid panel - CBC with Differential/Platelet - Comprehensive metabolic panel with GFR - TSH   No orders of the defined types were placed in this encounter.   No follow-ups on file.   The patient was advised to call back or seek an  in-person evaluation if the symptoms worsen or if the condition fails to improve as anticipated.  I discussed the assessment and treatment plan with the patient. The patient was provided an opportunity to ask questions and all were answered. The patient agreed with the plan and demonstrated an understanding of the instructions.  I, Shandale Malak, PA-C have reviewed all documentation for this visit. The documentation on 09/29/2023  for the exam, diagnosis, procedures, and orders are all accurate and complete.  Blane Bunting, [facility], MMS [facility] [phone] (phone) [phone] (fax)  [facility]

## 2023-09-29 NOTE — Telephone Encounter (Signed)
 Lung Cancer Screening Narrative/Criteria Questionnaire (Cigarette Smokers Only- No Cigars/Pipes/vapes)   Denise Spencer   SDMV:10/10/23 10:00- Sarah                                           Oct 09, 1960              LDCT: 10/12/23 10:00- [facility]    62 y.o.   Phone: [phone]  Lung Screening Narrative (confirm age 56-77 yrs Medicare / 50-80 yrs Private pay insurance)   Insurance information:[employer]   Referring Provider:Ostwalt   This screening involves an initial phone call with a team member from our program. It is called a shared decision making visit. The initial meeting is required by insurance and Medicare to make sure you understand the program. This appointment takes about 15-20 minutes to complete. The CT scan will completed at a separate date/time. This scan takes about 5-10 minutes to complete and you may eat and drink before and after the scan.  Criteria questions for Lung Cancer Screening:   Are you a current or former smoker? Current Age began smoking: 16   If you are a former smoker, what year did you quit smoking? (within 15 yrs)   To calculate your smoking history, I need an accurate estimate of how many packs of cigarettes you smoked per day and for how many years. (Not just the number of PPD you are now smoking)   Years smoking 45 x Packs per day 1/2 - 1 = Pack years 37   (at least 20 pack yrs)   (Make sure they understand that we need to know how much they have smoked in the past, not just the number of PPD they are smoking now)  Do you have a personal history of cancer?  No    Do you have a family history of cancer? Yes  (cancer type and and relative) Mother(breast) Sister(lymphoma) Father (thyroid)  Are you coughing up blood?  No  Have you had unexplained weight loss of 15 lbs or more in the last 6 months? No  It looks like you meet all criteria.     Additional information: N/A

## 2023-09-29 NOTE — Progress Notes (Signed)
 No to all vaccine

## 2023-09-30 ENCOUNTER — Encounter: Payer: Self-pay | Admitting: Physician Assistant

## 2023-09-30 DIAGNOSIS — Z122 Encounter for screening for malignant neoplasm of respiratory organs: Secondary | ICD-10-CM | POA: Diagnosis not present

## 2023-10-01 LAB — COMPREHENSIVE METABOLIC PANEL WITH GFR
ALT: 8 IU/L (ref 0–32)
AST: 15 IU/L (ref 0–40)
Albumin: 4.3 g/dL (ref 3.8–4.9)
Alkaline Phosphatase: 134 IU/L — ABNORMAL HIGH (ref 44–121)
BUN/Creatinine Ratio: 12 (ref 12–28)
BUN: 12 mg/dL (ref 8–27)
Bilirubin Total: <0.2 mg/dL (ref 0.0–1.2)
CO2: 25 mmol/L (ref 20–29)
Calcium: 9.4 mg/dL (ref 8.7–10.3)
Chloride: 100 mmol/L (ref 96–106)
Creatinine, Ser: 0.98 mg/dL (ref 0.57–1.00)
Globulin, Total: 2.4 g/dL (ref 1.5–4.5)
Glucose: 134 mg/dL — ABNORMAL HIGH (ref 70–99)
Potassium: 4.7 mmol/L (ref 3.5–5.2)
Sodium: 140 mmol/L (ref 134–144)
Total Protein: 6.7 g/dL (ref 6.0–8.5)
eGFR: 66 mL/min/{1.73_m2} (ref >59)

## 2023-10-01 LAB — CBC WITH DIFFERENTIAL/PLATELET
Basophils Absolute: 0.1 10*3/uL (ref 0.0–0.2)
Basos: 1 %
EOS (ABSOLUTE): 0.2 10*3/uL (ref 0.0–0.4)
Eos: 2 %
Hematocrit: 42.3 % (ref 34.0–46.6)
Hemoglobin: 13.6 g/dL (ref 11.1–15.9)
Immature Grans (Abs): 0 10*3/uL (ref 0.0–0.1)
Immature Granulocytes: 0 %
Lymphocytes Absolute: 4.2 10*3/uL — ABNORMAL HIGH (ref 0.7–3.1)
Lymphs: 42 %
MCH: 28.9 pg (ref 26.6–33.0)
MCHC: 32.2 g/dL (ref 31.5–35.7)
MCV: 90 fL (ref 79–97)
Monocytes Absolute: 0.5 10*3/uL (ref 0.1–0.9)
Monocytes: 5 %
Neutrophils Absolute: 5 10*3/uL (ref 1.4–7.0)
Neutrophils: 50 %
Platelets: 265 10*3/uL (ref 150–450)
RBC: 4.7 x10E6/uL (ref 3.77–5.28)
RDW: 12.8 % (ref 11.7–15.4)
WBC: 10 10*3/uL (ref 3.4–10.8)

## 2023-10-01 LAB — TSH: TSH: 5.14 u[IU]/mL — ABNORMAL HIGH (ref 0.450–4.500)

## 2023-10-01 LAB — LIPID PANEL
Chol/HDL Ratio: 3.3 ratio (ref 0.0–4.4)
Cholesterol, Total: 200 mg/dL — ABNORMAL HIGH (ref 100–199)
HDL: 61 mg/dL (ref >39)
LDL Chol Calc (NIH): 111 mg/dL — ABNORMAL HIGH (ref 0–99)
Triglycerides: 159 mg/dL — ABNORMAL HIGH (ref 0–149)
VLDL Cholesterol Cal: 28 mg/dL (ref 5–40)

## 2023-10-03 ENCOUNTER — Encounter: Payer: Self-pay | Admitting: Physician Assistant

## 2023-10-03 NOTE — Progress Notes (Signed)
 Please check with [facility] if he can add GGT test and the T4 free

## 2023-10-10 ENCOUNTER — Telehealth: Payer: Self-pay | Admitting: Acute Care

## 2023-10-10 ENCOUNTER — Encounter: Admitting: Acute Care

## 2023-10-10 NOTE — Telephone Encounter (Signed)
 I have attempted to call the patient for their scheduled shared decision making visit. They did not answer. I have left a HIPAA compliant message on her VM.I included the office contact information in the message. I will call again in 10 minutes. If the visit is not completed before the scheduled CT Chest, we will need to reschedule the CT.

## 2023-10-11 ENCOUNTER — Encounter: Payer: Self-pay | Admitting: Physician Assistant

## 2023-10-11 NOTE — Telephone Encounter (Signed)
 Spoke with patient. Advises she did not receive any calls. Request call back in one month due to upcoming foot surgery 10/14/2023. Reminder set.

## 2023-10-12 ENCOUNTER — Ambulatory Visit

## 2023-10-13 ENCOUNTER — Other Ambulatory Visit: Payer: Self-pay

## 2023-10-13 MED ORDER — ATORVASTATIN CALCIUM 20 MG PO TABS: 10.0000 mg | ORAL_TABLET | Freq: Every day | ORAL | 3 refills | Status: DC

## 2023-10-14 ENCOUNTER — Other Ambulatory Visit: Payer: Self-pay | Admitting: Podiatry

## 2023-10-14 DIAGNOSIS — M216X1 Other acquired deformities of right foot: Secondary | ICD-10-CM | POA: Diagnosis not present

## 2023-10-14 DIAGNOSIS — M7741 Metatarsalgia, right foot: Secondary | ICD-10-CM | POA: Diagnosis not present

## 2023-10-14 DIAGNOSIS — M89771 Major osseous defect, right ankle and foot: Secondary | ICD-10-CM | POA: Diagnosis not present

## 2023-10-14 DIAGNOSIS — M89071 Algoneurodystrophy, right ankle and foot: Secondary | ICD-10-CM | POA: Diagnosis not present

## 2023-10-14 DIAGNOSIS — M19271 Secondary osteoarthritis, right ankle and foot: Secondary | ICD-10-CM | POA: Diagnosis not present

## 2023-10-14 DIAGNOSIS — G8918 Other acute postprocedural pain: Secondary | ICD-10-CM | POA: Diagnosis not present

## 2023-10-14 DIAGNOSIS — M21541 Acquired clubfoot, right foot: Secondary | ICD-10-CM | POA: Diagnosis not present

## 2023-10-14 MED ORDER — GABAPENTIN 300 MG PO CAPS: 300.0000 mg | ORAL_CAPSULE | Freq: Three times a day (TID) | ORAL | 0 refills | Status: AC

## 2023-10-14 MED ORDER — RIVAROXABAN 10 MG PO TABS: 10.0000 mg | ORAL_TABLET | Freq: Every day | ORAL | 0 refills | Status: DC

## 2023-10-14 MED ORDER — ACETAMINOPHEN 500 MG PO TABS: 1000.0000 mg | ORAL_TABLET | Freq: Four times a day (QID) | ORAL | 0 refills | Status: AC | PRN

## 2023-10-14 MED ORDER — HYDROCODONE-ACETAMINOPHEN 5-325 MG PO TABS: 1.0000 | ORAL_TABLET | Freq: Four times a day (QID) | ORAL | 0 refills | Status: AC | PRN

## 2023-10-19 ENCOUNTER — Ambulatory Visit (INDEPENDENT_AMBULATORY_CARE_PROVIDER_SITE_OTHER)

## 2023-10-19 ENCOUNTER — Ambulatory Visit (INDEPENDENT_AMBULATORY_CARE_PROVIDER_SITE_OTHER): Payer: 59 | Admitting: Podiatry

## 2023-10-19 ENCOUNTER — Encounter: Payer: Self-pay | Admitting: Podiatry

## 2023-10-19 VITALS — Ht 67.0 in | Wt 216.0 lb

## 2023-10-19 DIAGNOSIS — M19071 Primary osteoarthritis, right ankle and foot: Secondary | ICD-10-CM | POA: Diagnosis not present

## 2023-10-19 DIAGNOSIS — M21961 Unspecified acquired deformity of right lower leg: Secondary | ICD-10-CM

## 2023-10-19 NOTE — Progress Notes (Signed)
  Subjective:  Patient ID: Denise Spencer, female    DOB: 06-Oct-1960,  MRN: [id number]  Chief Complaint  Patient presents with   Routine Post Op    POV # 1 DOS 10/14/23 --- FUSION OF RIGHT GREAT TOE JOINT WITH BONE GRAFT FROM HEEL, SHORTENING OF 2,3 METATARSALS Foot looks good minimal swelling or pain     63 y.o. female returns for post-op check.  She is doing well not having much pain  Review of Systems: Negative except as noted in the HPI. Denies N/V/F/Ch.   Objective:  There were no vitals filed for this visit. Body mass index is 33.83 kg/m. Constitutional Well developed. Well nourished.  Vascular Foot warm and well perfused. Capillary refill normal to all digits.  Calf is soft and supple, no posterior calf or knee pain, negative Homans' sign  Neurologic Normal speech. Oriented to person, place, and time. Epicritic sensation to light touch grossly present bilaterally.  Dermatologic Skin healing well without signs of infection. Skin edges well coapted without signs of infection.  Orthopedic: Tenderness to palpation noted about the surgical site.   Multiple view plain film radiographs: Good correction is noted hardware intact and in position of MTP fusion Assessment:   1. Arthritis of first metatarsophalangeal (MTP) joint of right foot   2. Metatarsal deformity, right    Plan:  Patient was evaluated and treated and all questions answered.  S/p foot surgery right -Progressing as expected post-operatively. -XR: Noted above no complication -WB Status: NWB in cam walker boot -Sutures: Removed in 2 weeks. -Medications: No refills required -Foot redressed.  No follow-ups on file.

## 2023-10-22 ENCOUNTER — Other Ambulatory Visit: Payer: Self-pay | Admitting: Podiatry

## 2023-10-25 DIAGNOSIS — Z7289 Other problems related to lifestyle: Secondary | ICD-10-CM | POA: Diagnosis not present

## 2023-10-25 DIAGNOSIS — G894 Chronic pain syndrome: Secondary | ICD-10-CM | POA: Diagnosis not present

## 2023-10-25 DIAGNOSIS — Z91199 Patient's noncompliance with other medical treatment and regimen due to unspecified reason: Secondary | ICD-10-CM | POA: Diagnosis not present

## 2023-11-02 ENCOUNTER — Encounter: Payer: Self-pay | Admitting: Podiatry

## 2023-11-02 ENCOUNTER — Ambulatory Visit (INDEPENDENT_AMBULATORY_CARE_PROVIDER_SITE_OTHER): Payer: 59 | Admitting: Podiatry

## 2023-11-02 VITALS — Ht 67.0 in | Wt 216.0 lb

## 2023-11-02 DIAGNOSIS — M19071 Primary osteoarthritis, right ankle and foot: Secondary | ICD-10-CM

## 2023-11-02 NOTE — Progress Notes (Unsigned)
  Subjective:  Patient ID: Denise Spencer, female    DOB: [birth year],  MRN: [id number]  Chief Complaint  Patient presents with   Routine Post Op    POV # 2 DOS 10/14/23 --- FUSION OF RIGHT GREAT TOE JOINT WITH BONE GRAFT FROM HEEL, SHORTENING OF 2,3 METATARSALS States foot is feeling well has burning and tingling pain in heel at night that hurts enough to take pain med.     63 y.o. female returns for post-op check.   Review of Systems: Negative except as noted in the HPI. Denies N/V/F/Ch.   Objective:  There were no vitals filed for this visit. Body mass index is 33.83 kg/m. Constitutional Well developed. Well nourished.  Vascular Foot warm and well perfused. Capillary refill normal to all digits.  Calf is soft and supple, no posterior calf or knee pain, negative Homans' sign  Neurologic Normal speech. Oriented to person, place, and time. Epicritic sensation to light touch grossly present bilaterally.  Dermatologic Skin healing well without signs of infection. Skin edges well coapted without signs of infection.  Orthopedic: Tenderness to palpation noted about the surgical site.   Multiple view plain film radiographs: Good correction is noted hardware intact and in position of MTP fusion Assessment:   1. Arthritis of first metatarsophalangeal (MTP) joint of right foot    Plan:  Patient was evaluated and treated and all questions answered.  S/p foot surgery right - Sutures removed uneventfully.  May begin gradual weightbearing in a cam walker boot.  Follow-up in 3 weeks for new radiographs, likely will be able to transition to surgical shoe at that point.  No follow-ups on file.

## 2023-11-04 LAB — COLOGUARD: Cologuard: NEGATIVE

## 2023-11-08 ENCOUNTER — Encounter: Payer: Self-pay | Admitting: Physician Assistant

## 2023-11-09 ENCOUNTER — Other Ambulatory Visit: Payer: Self-pay

## 2023-11-09 ENCOUNTER — Encounter: Payer: Self-pay | Admitting: Acute Care

## 2023-11-11 ENCOUNTER — Telehealth: Payer: Self-pay

## 2023-11-11 NOTE — Telephone Encounter (Signed)
 LVM to call and schedule LDCT, missed in April due to foot surgery. SDMV already completed.

## 2023-11-15 ENCOUNTER — Encounter: Payer: Self-pay | Admitting: Podiatry

## 2023-11-20 DIAGNOSIS — G4733 Obstructive sleep apnea (adult) (pediatric): Secondary | ICD-10-CM | POA: Diagnosis not present

## 2023-11-21 DIAGNOSIS — M25521 Pain in right elbow: Secondary | ICD-10-CM | POA: Diagnosis not present

## 2023-11-23 ENCOUNTER — Encounter: Payer: 59 | Admitting: Podiatry

## 2023-11-23 DIAGNOSIS — Z91199 Patient's noncompliance with other medical treatment and regimen due to unspecified reason: Secondary | ICD-10-CM | POA: Diagnosis not present

## 2023-11-23 DIAGNOSIS — Z7289 Other problems related to lifestyle: Secondary | ICD-10-CM | POA: Diagnosis not present

## 2023-11-23 DIAGNOSIS — G894 Chronic pain syndrome: Secondary | ICD-10-CM | POA: Diagnosis not present

## 2023-11-28 DIAGNOSIS — Z1212 Encounter for screening for malignant neoplasm of rectum: Secondary | ICD-10-CM | POA: Diagnosis not present

## 2023-11-28 DIAGNOSIS — Z1211 Encounter for screening for malignant neoplasm of colon: Secondary | ICD-10-CM | POA: Diagnosis not present

## 2023-11-30 ENCOUNTER — Ambulatory Visit: Admitting: Podiatry

## 2023-11-30 ENCOUNTER — Other Ambulatory Visit: Payer: Self-pay | Admitting: Physician Assistant

## 2023-11-30 DIAGNOSIS — M797 Fibromyalgia: Secondary | ICD-10-CM | POA: Diagnosis not present

## 2023-11-30 DIAGNOSIS — G894 Chronic pain syndrome: Secondary | ICD-10-CM | POA: Diagnosis not present

## 2023-11-30 NOTE — Telephone Encounter (Signed)
 Copied from CRM [id number]. Topic: Clinical - Medication Refill >> Nov 30, 2023  9:22 AM Baldomero Bone wrote: Medication: atorvastatin  (LIPITOR) 20 MG tablet  Has the patient contacted their pharmacy? No (Agent: If no, request that the patient contact the pharmacy for the refill. If patient does not wish to contact the pharmacy document the reason why and proceed with request.) (Agent: If yes, when and what did the pharmacy advise?)  This is the patient's preferred pharmacy:  [id number] [place], [place] - [address] AT [facility] OF [address] [place] [address] Phone: [phone] Fax: [phone]  Is this the correct pharmacy for this prescription? Yes If no, delete pharmacy and type the correct one.   Has the prescription been filled recently? No  Is the patient out of the medication? Yes  Has the patient been seen for an appointment in the last year OR does the patient have an upcoming appointment? Yes  Can we respond through MyChart? Yes  Agent: Please be advised that Rx refills may take up to 3 business days. We ask that you follow-up with your pharmacy.

## 2023-12-01 NOTE — Telephone Encounter (Signed)
 Refill refused pt requesting refill too soon.

## 2023-12-05 ENCOUNTER — Telehealth: Payer: Self-pay

## 2023-12-05 NOTE — Telephone Encounter (Signed)
 Copied from CRM [id number]. Topic: Clinical - Prescription Issue >> Dec 05, 2023 11:52 AM Adaline Holly wrote:    ----------------------------------------------------------------------- From previous Reason for Contact - Other: Patient states that the last time she has her atorvastatin  filled she did not realize it was for 20mg  and that she was supposed to be taking .5 tablets and has been taking the full tablet. Now she says she is out of the medication and not sure what to do. Please advise.

## 2023-12-06 ENCOUNTER — Ambulatory Visit (INDEPENDENT_AMBULATORY_CARE_PROVIDER_SITE_OTHER): Admitting: Sleep Medicine

## 2023-12-06 VITALS — BP 118/80 | HR 87 | Temp 97.1°F | Ht 67.0 in | Wt 211.6 lb

## 2023-12-06 DIAGNOSIS — E669 Obesity, unspecified: Secondary | ICD-10-CM | POA: Diagnosis not present

## 2023-12-06 DIAGNOSIS — G4733 Obstructive sleep apnea (adult) (pediatric): Secondary | ICD-10-CM

## 2023-12-06 LAB — COLOGUARD: COLOGUARD: NEGATIVE

## 2023-12-06 NOTE — Progress Notes (Addendum)
 Name:Denise Spencer MRN: [id number] DOB: 1960/07/24   CHIEF COMPLAINT:  CPAP F/U   HISTORY OF PRESENT ILLNESS:  Denise Spencer is a 63 y.o. w/ a h/o OSA, hyperlipidemia, anxiety, depression and obesity who presents for CPAP every night, which is confirmed by compliance data. She is currently using the Airfit N30i nasal mask, which is comfortable. Reports feeling more refreshed upon awakening with CPAP therapy.    EPWORTH SLEEP SCORE    08/09/2023   10:00 AM  Results of the Epworth flowsheet  Sitting and reading 0  Watching TV 0  Sitting, inactive in a public place (e.g. a theatre or a meeting) 0  As a passenger in a car for an hour without a break 0  Lying down to rest in the afternoon when circumstances permit 1  Sitting and talking to someone 0  Sitting quietly after a lunch without alcohol 0  In a car, while stopped for a few minutes in traffic 0  Total score 1    PAST MEDICAL HISTORY :   has a past medical history of Allergy (1970's), Anxiety, Arthritis, Asthma, Bipolar disorder (HCC), Bulging lumbar disc, COPD (chronic obstructive pulmonary disease) (HCC), Depression, Dyspnea, Fibromyalgia, GERD (gastroesophageal reflux disease), PONV (postoperative nausea and vomiting), Sleep apnea, and Wears dentures.  has a past surgical history that includes Cesarean section; Abdominal hysterectomy w/ partial vaginactomy; Wrist ganglion excision; Colonoscopy; Colonoscopy with propofol  (N/A, 11/01/2016); Esophagogastroduodenoscopy (egd) with propofol  (N/A, 11/01/2016); polypectomy (11/01/2016); Septoplasty (Left, 12/27/2016); Cardiac catheterization (2012); Hip Arthroplasty (Left, 04/2020); Colonoscopy with propofol  (N/A, 05/22/2021); polypectomy (N/A, 05/22/2021); Tubal ligation; Anterior cervical decomp/discectomy fusion (N/A, 12/15/2022); Joint replacement (2021); Spine surgery (2024); and Abdominal hysterectomy (2002). Prior to Admission medications   Medication Sig Start Date End  Date Taking? Authorizing Provider  albuterol  (VENTOLIN  HFA) 108 (90 Base) MCG/ACT inhaler Inhale 2 puffs into the lungs every 6 (six) hours as needed for wheezing or shortness of breath.   Yes [provider]  atorvastatin  (LIPITOR) 20 MG tablet Take 0.5 tablets (10 mg total) by mouth daily. 10/13/23  Yes Ostwalt, Janna, PA-C  Cholecalciferol (VITAMIN D3 MAXIMUM STRENGTH) 125 MCG (5000 UT) capsule Take 5,000 Units by mouth daily.   Yes [provider]  clonazePAM  (KLONOPIN ) 1 MG tablet Take 1 tablet (1 mg total) by mouth 4 (four) times daily as needed. Patient taking differently: Take 1-2 mg by mouth See admin instructions. Take 2 mg at night, may take a 1 mg dose twice daily as needed for anxiety 10/19/22  Yes [name], Elida Grounds, MD  gabapentin  (NEURONTIN ) 300 MG capsule Take 1 capsule (300 mg total) by mouth 3 (three) times daily for 7 days. 10/14/23 12/06/23 Yes McDonald, Olive Better, DPM  metaxalone (SKELAXIN) 800 MG tablet Take 800 mg by mouth 3 (three) times daily as needed. 08/27/23  Yes [provider]  nortriptyline  (PAMELOR ) 75 MG capsule Take 75 mg by mouth at bedtime. 11/30/23  Yes [provider]  pantoprazole  (PROTONIX ) 40 MG tablet Take 1 tablet (40 mg total) by mouth daily. 09/28/23  Yes Ostwalt, Janna, PA-C  QUEtiapine  (SEROQUEL  XR) 50 MG TB24 24 hr tablet Take 50 mg by mouth at bedtime. 08/11/23  Yes [provider]  sertraline  (ZOLOFT ) 100 MG tablet Take 1.5 tablets (150 mg total) by mouth daily. 10/19/22  Yes [name], Elida Grounds, MD  traMADol  (ULTRAM ) 50 MG tablet Take 50 mg by mouth every 6 (six) hours as needed for moderate pain.   Yes  [provider]   Allergies  Allergen Reactions   Advair Diskus [Fluticasone -Salmeterol]     Caused thrush   Aspirin     Muscle and joint pain   Breo Ellipta  [Fluticasone  Furoate-Vilanterol]     Caused thrush   Nsaids Nausea And Vomiting   Tape     Some adhesives cause blisters.  Tegaderm is OK.    Amoxicillin Rash   Doxycycline Rash   Erythromycin Rash   Penicillins Rash   Sulfa Antibiotics Rash         FAMILY HISTORY:  family history includes Alcohol abuse in her sister; Anxiety disorder in her mother, sister, and sister; Arthritis in her father and mother; Asthma in her sister; Bipolar disorder in her mother, sister, sister, and sister; Breast cancer (age of onset: 14) in her mother; Cancer in her father, mother, and sister; Depression in her mother and sister; Early death in her sister; Heart attack in her father; Heart disease in her father; Hypertension in her father, sister, and sister; Learning disabilities in her sister; Lymphoma in her sister; Miscarriages / Stillbirths in her sister and sister; Multiple sclerosis in her sister and another family member; Obesity in her mother and sister; Thyroid  cancer in her father; Varicose Veins in her mother. SOCIAL HISTORY:  reports that she quit smoking about 14 months ago. Her smoking use included cigarettes. She started smoking about 46 years ago. She has a 22.7 pack-year smoking history. She has never used smokeless tobacco. She reports that she does not drink alcohol and does not use drugs.   Review of Systems:  Gen:  Denies  fever, sweats, chills weight loss  HEENT: Denies blurred vision, double vision, ear pain, eye pain, hearing loss, nose bleeds, sore throat Cardiac:  No dizziness, chest pain or heaviness, chest tightness,edema, No JVD Resp:   No cough, -sputum production, -shortness of breath,-wheezing, -hemoptysis,  Gi: Denies swallowing difficulty, stomach pain, nausea or vomiting, diarrhea, constipation, bowel incontinence Gu:  Denies bladder incontinence, burning urine Ext:   Denies Joint pain, stiffness or swelling Skin: Denies  skin rash, easy bruising or bleeding or hives Endoc:  Denies polyuria, polydipsia , polyphagia or weight change Psych:   Denies depression, insomnia or hallucinations  Other:  All other systems  negative  VITAL SIGNS: BP 118/80 (BP Location: Right Arm, Cuff Size: Large)   Pulse 87   Temp (!) 97.1 F (36.2 C)   Ht 5' 7 (1.702 m)   Wt 211 lb 9.6 oz (96 kg)   SpO2 98%   BMI 33.14 kg/m    Physical Examination:   General Appearance: No distress  EYES PERRLA, EOM intact.   NECK Supple, No JVD Pulmonary: normal breath sounds, No wheezing.  CardiovascularNormal S1,S2.  No m/r/g.   Abdomen: Benign, Soft, non-tender. Skin:   warm, no rashes, no ecchymosis  Extremities: normal, no cyanosis, clubbing. Neuro:without focal findings,  speech normal  PSYCHIATRIC: Mood, affect within normal limits.   ASSESSMENT AND PLAN  OSA Patient is using and benefiting from CPAP therapy. Also counseled patient on increasing total sleep time to 7-8 hours per night. Discussed the consequences of untreated sleep apnea. Advised not to drive drowsy for safety of patient and others. Will follow up in 6 months.   Obesity Counseled patient on diet and lifestyle modification.    Patient  satisfied with Plan of action and management. All questions answered  I spent a total of 30 minutes reviewing chart data, face-to-face evaluation with the patient, counseling  and coordination of care as detailed above.    Lili Harts, M.D.  Sleep Medicine [facility]

## 2023-12-06 NOTE — Addendum Note (Signed)
 Addended by: Aileen Amore on: 12/06/2023 02:08 PM   Modules accepted: Level of Service

## 2023-12-06 NOTE — Patient Instructions (Signed)

## 2023-12-07 ENCOUNTER — Other Ambulatory Visit: Payer: Self-pay | Admitting: Physician Assistant

## 2023-12-07 ENCOUNTER — Encounter: Payer: Self-pay | Admitting: Physician Assistant

## 2023-12-07 DIAGNOSIS — E7849 Other hyperlipidemia: Secondary | ICD-10-CM

## 2023-12-07 MED ORDER — ATORVASTATIN CALCIUM 20 MG PO TABS: ORAL_TABLET | ORAL | 1 refills | Status: DC

## 2023-12-12 ENCOUNTER — Encounter: Payer: Self-pay | Admitting: Podiatry

## 2023-12-12 ENCOUNTER — Ambulatory Visit (INDEPENDENT_AMBULATORY_CARE_PROVIDER_SITE_OTHER)

## 2023-12-12 ENCOUNTER — Ambulatory Visit (INDEPENDENT_AMBULATORY_CARE_PROVIDER_SITE_OTHER): Admitting: Podiatry

## 2023-12-12 VITALS — BP 110/76 | HR 77 | Temp 98.4°F

## 2023-12-12 DIAGNOSIS — S93401S Sprain of unspecified ligament of right ankle, sequela: Secondary | ICD-10-CM

## 2023-12-12 DIAGNOSIS — M79671 Pain in right foot: Secondary | ICD-10-CM | POA: Diagnosis not present

## 2023-12-12 DIAGNOSIS — M19071 Primary osteoarthritis, right ankle and foot: Secondary | ICD-10-CM

## 2023-12-12 DIAGNOSIS — M21961 Unspecified acquired deformity of right lower leg: Secondary | ICD-10-CM

## 2023-12-12 NOTE — Progress Notes (Signed)
  Subjective:  Patient ID: Denise Spencer, female    DOB: [birth year],  MRN: [id number]  Chief Complaint  Patient presents with   Routine Post Op    POV # 3 DOS 10/14/23 --- FUSION OF RIGHT GREAT TOE JOINT WITH BONE GRAFT FROM HEEL, SHORTENING OF 2,3 METATARSALS It's doing great.     63 y.o. female returns for post-op check.  She is doing well not having any pain at the surgical site.  Swelling has gotten much better.  She has been using the surgical shoe.  Of note she also has been having some ankle pain prior to surgery that has not helped after healing in the boot  Review of Systems: Negative except as noted in the HPI. Denies N/V/F/Ch.   Objective:   Vitals:   12/12/23 0943  BP: 110/76  Pulse: 77  Temp: 98.4 F (36.9 C)   There is no height or weight on file to calculate BMI. Constitutional Well developed. Well nourished.  Vascular Foot warm and well perfused. Capillary refill normal to all digits.  Calf is soft and supple, no posterior calf or knee pain, negative Homans' sign  Neurologic Normal speech. Oriented to person, place, and time. Epicritic sensation to light touch grossly present bilaterally.  Dermatologic Incision well-healed not hypertrophic  Orthopedic: Minimal edema and no pain to palpation noted about the surgical site.  She does have tenderness over the ATFL and sinus tarsi   Multiple view plain film radiographs: Correction maintained has complete consolidation of the fusion site and good consolidation of metatarsal osteotomies Assessment:   1. Arthritis of first metatarsophalangeal (MTP) joint of right foot   2. Metatarsal deformity, right   3. Moderate right ankle sprain, sequela    Plan:  Patient was evaluated and treated and all questions answered.  S/p foot surgery right - Doing well.  Transition back to shoe gear and activity as tolerated.  Return as needed for her surgical site  Has been dealing with ongoing ankle pain and persistent  sequela of old ankle sprain.  I recommended physical therapy and referral was placed for this.  I have no restrictions in regard to her therapy from her surgery she may be full activity.  If not better in 2 months return to see me we will take new x-rays and consider other treatment options.  Return if symptoms worsen or fail to improve.

## 2023-12-13 DIAGNOSIS — M47816 Spondylosis without myelopathy or radiculopathy, lumbar region: Secondary | ICD-10-CM | POA: Diagnosis not present

## 2023-12-21 ENCOUNTER — Encounter: Payer: Self-pay | Admitting: Physician Assistant

## 2023-12-21 DIAGNOSIS — K219 Gastro-esophageal reflux disease without esophagitis: Secondary | ICD-10-CM

## 2023-12-26 MED ORDER — PANTOPRAZOLE SODIUM 40 MG PO TBEC: 40.0000 mg | DELAYED_RELEASE_TABLET | Freq: Two times a day (BID) | ORAL | 3 refills | Status: AC

## 2023-12-27 ENCOUNTER — Other Ambulatory Visit: Payer: Self-pay | Admitting: Medical Genetics

## 2023-12-27 DIAGNOSIS — G4733 Obstructive sleep apnea (adult) (pediatric): Secondary | ICD-10-CM | POA: Diagnosis not present

## 2023-12-27 DIAGNOSIS — G894 Chronic pain syndrome: Secondary | ICD-10-CM | POA: Diagnosis not present

## 2023-12-27 DIAGNOSIS — Z91199 Patient's noncompliance with other medical treatment and regimen due to unspecified reason: Secondary | ICD-10-CM | POA: Diagnosis not present

## 2023-12-29 ENCOUNTER — Ambulatory Visit (INDEPENDENT_AMBULATORY_CARE_PROVIDER_SITE_OTHER): Admitting: Physician Assistant

## 2023-12-29 VITALS — BP 131/85 | HR 84 | Resp 16 | Ht 67.0 in | Wt 211.0 lb

## 2023-12-29 DIAGNOSIS — K219 Gastro-esophageal reflux disease without esophagitis: Secondary | ICD-10-CM | POA: Diagnosis not present

## 2023-12-29 DIAGNOSIS — L97512 Non-pressure chronic ulcer of other part of right foot with fat layer exposed: Secondary | ICD-10-CM | POA: Insufficient documentation

## 2023-12-29 DIAGNOSIS — J449 Chronic obstructive pulmonary disease, unspecified: Secondary | ICD-10-CM | POA: Insufficient documentation

## 2023-12-29 DIAGNOSIS — G4730 Sleep apnea, unspecified: Secondary | ICD-10-CM | POA: Diagnosis not present

## 2023-12-29 DIAGNOSIS — E7849 Other hyperlipidemia: Secondary | ICD-10-CM | POA: Insufficient documentation

## 2023-12-29 DIAGNOSIS — R030 Elevated blood-pressure reading, without diagnosis of hypertension: Secondary | ICD-10-CM

## 2023-12-29 DIAGNOSIS — Z122 Encounter for screening for malignant neoplasm of respiratory organs: Secondary | ICD-10-CM

## 2023-12-29 DIAGNOSIS — L819 Disorder of pigmentation, unspecified: Secondary | ICD-10-CM | POA: Diagnosis not present

## 2023-12-29 DIAGNOSIS — R7303 Prediabetes: Secondary | ICD-10-CM | POA: Insufficient documentation

## 2023-12-29 DIAGNOSIS — G894 Chronic pain syndrome: Secondary | ICD-10-CM

## 2023-12-29 DIAGNOSIS — G629 Polyneuropathy, unspecified: Secondary | ICD-10-CM | POA: Diagnosis not present

## 2023-12-29 DIAGNOSIS — F3181 Bipolar II disorder: Secondary | ICD-10-CM

## 2023-12-29 HISTORY — DX: Sleep apnea, unspecified: G47.30

## 2023-12-29 NOTE — Progress Notes (Signed)
 Established patient visit  Patient: Denise Spencer   DOB: February 01, 1961   63 y.o. Female  MRN: [id number] Visit Date: 12/29/2023  Today's healthcare provider: Jolynn Spencer, PA-C   Chief Complaint  Patient presents with   Follow-up    3 month f/u no other concens   Subjective     Discussed the use of AI scribe software for clinical note transcription with the patient, who gave verbal consent to proceed.  History of Present Illness Denise Spencer is a 63 year old female who presents for a follow-up visit.  She is recovering from foot surgery performed in April, with a six to seven-week non-weight bearing period. She is now ambulatory, wearing a slipper, and experiencing some pain. Her activity level is gradually increasing.  She has reduced her sugar intake after an elevated blood sugar reading in April. Her blood pressure is slightly elevated, attributed to stress and foot pain, with a usual reading of 110/80.  She takes 20 mg of medication daily for cholesterol management and is doing well. She uses pantoprazole  40mg , occasionally doubling the dose for flare-ups related to bending over, and avoids eating past 7 PM.  She is on sertraline  150 mg for depression and anxiety, and clonazepam  0.5 mg up to four times a day, usually taking two. Gabapentin  300 mg three times a day is prescribed for pain management.  She experiences neuropathy in her feet and hands, concerning due to prediabetes. She monitors her eyes and kidneys for potential complications.  She plans to see an OB-GYN for a Pap smear and is considering a dermatology referral for cysts and skin changes. She intends to reschedule a missed lung cancer screening appointment.     09/29/2023    2:01 PM 08/04/2023   10:23 AM 06/30/2023    9:27 AM  Depression screen PHQ 2/9  Decreased Interest 0 2 2  Down, Depressed, Hopeless 0 0 2  PHQ - 2 Score 0 2 4  Altered sleeping 2 2 2   Tired, decreased energy 2 3 3   Change in appetite  0 0 0  Feeling bad or failure about yourself   0 0  Trouble concentrating 0 3 1  Moving slowly or fidgety/restless 0 0 0  Suicidal thoughts 0 0 0  PHQ-9 Score 4 10 10   Difficult doing work/chores Somewhat difficult Not difficult at all Not difficult at all      09/29/2023    2:02 PM 08/04/2023   10:23 AM 06/30/2023    9:27 AM  GAD 7 : Generalized Anxiety Score  Nervous, Anxious, on Edge 1 2 2   Control/stop worrying 0 0 0  Worry too much - different things 0 2 0  Trouble relaxing 0 3 2  Restless 0 0 0  Easily annoyed or irritable 0 3 2  Afraid - awful might happen 0 0 0  Total GAD 7 Score 1 10 6   Anxiety Difficulty Somewhat difficult Not difficult at all Somewhat difficult    Medications: Outpatient Medications Prior to Visit  Medication Sig   albuterol  (VENTOLIN  HFA) 108 (90 Base) MCG/ACT inhaler Inhale 2 puffs into the lungs every 6 (six) hours as needed for wheezing or shortness of breath.   atorvastatin  (LIPITOR) 20 MG tablet Take 1 tab daily by mouth   Cholecalciferol (VITAMIN D3 MAXIMUM STRENGTH) 125 MCG (5000 UT) capsule Take 5,000 Units by mouth daily.   clonazePAM  (KLONOPIN ) 1 MG tablet Take 1 tablet (1 mg total) by mouth 4 (four) times daily as  needed. (Patient taking differently: Take 1-2 mg by mouth See admin instructions. Take 2 mg at night, may take a 1 mg dose twice daily as needed for anxiety)   gabapentin  (NEURONTIN ) 300 MG capsule Take 1 capsule (300 mg total) by mouth 3 (three) times daily for 7 days. (Patient taking differently: Take 1 capsule (300 mg total) by mouth 3 (three) times daily for 7 days.)   metaxalone (SKELAXIN) 800 MG tablet Take 800 mg by mouth 3 (three) times daily as needed.   nortriptyline  (PAMELOR ) 75 MG capsule Take 75 mg by mouth at bedtime.   pantoprazole  (PROTONIX ) 40 MG tablet Take 1 tablet (40 mg total) by mouth 2 (two) times daily. As needed   QUEtiapine  (SEROQUEL  XR) 50 MG TB24 24 hr tablet Take 50 mg by mouth at bedtime.   sertraline   (ZOLOFT ) 100 MG tablet Take 1.5 tablets (150 mg total) by mouth daily.   traMADol  (ULTRAM ) 50 MG tablet Take 50 mg by mouth every 6 (six) hours as needed for moderate pain. (Patient not taking: Reported on 12/29/2023)   No facility-administered medications prior to visit.    Review of Systems All negative Except see HPI       Objective    BP 131/85 (BP Location: Left Arm, Patient Position: Sitting)   Pulse 84   Resp 16   Ht 5' 7 (1.702 m)   Wt 211 lb (95.7 kg)   SpO2 100%   BMI 33.05 kg/m     Physical Exam Vitals reviewed.  Constitutional:      General: She is not in acute distress.    Appearance: Normal appearance. She is well-developed. She is obese. She is not diaphoretic.  HENT:     Head: Normocephalic and atraumatic.  Eyes:     General: No scleral icterus.    Conjunctiva/sclera: Conjunctivae normal.  Neck:     Thyroid : No thyromegaly.  Cardiovascular:     Rate and Rhythm: Normal rate and regular rhythm.     Pulses: Normal pulses.     Heart sounds: Normal heart sounds. No murmur heard. Pulmonary:     Effort: Pulmonary effort is normal. No respiratory distress.     Breath sounds: Normal breath sounds. No wheezing, rhonchi or rales.  Musculoskeletal:     Cervical back: Neck supple.     Right lower leg: No edema.     Left lower leg: No edema.  Lymphadenopathy:     Cervical: No cervical adenopathy.  Skin:    General: Skin is warm and dry.     Findings: No rash.  Neurological:     Mental Status: She is alert and oriented to person, place, and time. Mental status is at baseline.  Psychiatric:        Mood and Affect: Mood normal.        Behavior: Behavior normal.      Results for orders placed or performed in visit on 12/29/23  Cologuard  Result Value Ref Range   Cologuard Negative Negative        Assessment & Plan S/p  foot surgery right p Recovering from foot surgery with expected discomfort. Ambulating with increased steps, limited to wearing a  slipper. - Continue gradual increase in physical activity as tolerated. Managed by podiatry/Dr. Silva  Chronic pain management Undergoing pain management with gabapentin  for chronic pain related to foot surgery and other conditions. - Continue gabapentin  as prescribed by pain management specialist.  Neuropathy Reports neuropathy in feet and hands, concerning in  the context of prediabetes. Advised monitoring for changes in vision or kidney function. - Monitor for changes in vision and kidney function. - Manage blood glucose levels to prevent progression.  Prediabetes Implementing dietary changes to reduce sugar intake. Last blood work showed elevated glucose levels. - Repeat blood work to monitor glucose levels. - Continue dietary modifications to reduce sugar intake.  Elevated BP reading. Blood pressure slightly elevated.  Could be due to pain , not on antihypertensive medication. Concerned due to existing hyperlipidemia. - Monitor blood pressure regularly. - Consider lifestyle modifications to manage stress and pain. Will follow-up  Hyperlipidemia Chronic  on medication/Lipitor 20 for cholesterol management. Emphasized importance due to cardiovascular risk. - Continue current cholesterol medication regimen. Will follow-up  Gastroesophageal reflux disease (GERD) Chronic , on pantoprazole   experiences GERD flare-ups, particularly when bending over. - Continue pantoprazole  40, with option to double dose during flare-ups.  Mood disorder  Chronic and stable taking sertraline  150!, clonazepam  1mg , and quetiapine  50mg , nortriptyline  75mg  Medication regimen confirmed. - Continue current psychiatric medication regimen. Managed by behavioral health  Skin lesions Developed cysts and pigmented lesions, including one over eyelid. Seeks evaluation for these skin changes. - Refer to dermatology for evaluation of skin lesions.  Lung cancer screening Missed initial lung cancer  screening appointment due to foot surgery. - Contact lung cancer screening office to reschedule appointment.  General Health Maintenance Due for Pap smear and considering shingles vaccine. - Schedule Pap smear with new OBGYN. - Consider shingles vaccination at future visit.  Follow-up Managing multiple health conditions. - Schedule follow-up appointment in three months. Other hyperlipidemia (Primary) - Lipid panel - Hemoglobin A1c - TSH - T4, free - Comprehensive metabolic panel with GFR - CBC with Differential/Platelet  Morbid obesity (HCC) Chronic and improving Body mass index is 33.05 kg/m. Weight loss of 5% of pt's current weight via healthy diet and daily exercise encouraged. Workup ordered - Lipid panel - Hemoglobin A1c - TSH - T4, free - Comprehensive metabolic panel with GFR - CBC with Differential/Platelet Will reassess after receiving lab results  Sleep apnea in adult Chronic and stable  currently on CPAP machine -Lipid panel - Hemoglobin A1c - TSH - T4, free - Comprehensive metabolic panel with GFR - CBC with Differential/Platelet Will follow-up  Prediabetes - Lipid panel - Hemoglobin A1c - TSH - T4, free - Comprehensive metabolic panel with GFR - CBC with Differential/Platelet  Change in multiple pigmented skin lesions - Ambulatory referral to Dermatology  Orders Placed This Encounter  Procedures   Cologuard    This external order was created through the Results Console.    No follow-ups on file.   The patient was advised to call back or seek an in-person evaluation if the symptoms worsen or if the condition fails to improve as anticipated.  I discussed the assessment and treatment plan with the patient. The patient was provided an opportunity to ask questions and all were answered. The patient agreed with the plan and demonstrated an understanding of the instructions.  I, Anadia Helmes, PA-C have reviewed all documentation for this visit.  The documentation on 12/29/2023  for the exam, diagnosis, procedures, and orders are all accurate and complete.  Jolynn Spencer, [facility], MMS [facility] [phone] (phone) [phone] (fax)  [facility]

## 2024-01-02 ENCOUNTER — Other Ambulatory Visit

## 2024-01-03 ENCOUNTER — Other Ambulatory Visit

## 2024-01-05 ENCOUNTER — Other Ambulatory Visit

## 2024-01-10 ENCOUNTER — Encounter: Payer: Self-pay | Admitting: Physician Assistant

## 2024-01-10 DIAGNOSIS — J449 Chronic obstructive pulmonary disease, unspecified: Secondary | ICD-10-CM

## 2024-01-11 MED ORDER — ALBUTEROL SULFATE HFA 108 (90 BASE) MCG/ACT IN AERS: 2.0000 | INHALATION_SPRAY | Freq: Four times a day (QID) | RESPIRATORY_TRACT | 3 refills | Status: AC | PRN

## 2024-01-12 DIAGNOSIS — M7062 Trochanteric bursitis, left hip: Secondary | ICD-10-CM | POA: Diagnosis not present

## 2024-01-16 DIAGNOSIS — G894 Chronic pain syndrome: Secondary | ICD-10-CM | POA: Diagnosis not present

## 2024-01-16 DIAGNOSIS — M797 Fibromyalgia: Secondary | ICD-10-CM | POA: Diagnosis not present

## 2024-01-17 DIAGNOSIS — M47816 Spondylosis without myelopathy or radiculopathy, lumbar region: Secondary | ICD-10-CM | POA: Diagnosis not present

## 2024-01-23 ENCOUNTER — Telehealth: Payer: Self-pay | Admitting: *Deleted

## 2024-01-23 NOTE — Telephone Encounter (Signed)
 Patient called in and left voicemail to reschedule lung screening. Attempted to contact pt but had to leave another message for patient to call.

## 2024-01-25 ENCOUNTER — Ambulatory Visit (INDEPENDENT_AMBULATORY_CARE_PROVIDER_SITE_OTHER)

## 2024-01-25 ENCOUNTER — Ambulatory Visit: Admitting: Podiatry

## 2024-01-25 ENCOUNTER — Other Ambulatory Visit

## 2024-01-25 VITALS — Ht 67.0 in | Wt 211.0 lb

## 2024-01-25 DIAGNOSIS — S93401S Sprain of unspecified ligament of right ankle, sequela: Secondary | ICD-10-CM

## 2024-01-25 DIAGNOSIS — M19071 Primary osteoarthritis, right ankle and foot: Secondary | ICD-10-CM

## 2024-01-26 ENCOUNTER — Ambulatory Visit: Attending: Podiatry

## 2024-01-26 DIAGNOSIS — R262 Difficulty in walking, not elsewhere classified: Secondary | ICD-10-CM | POA: Diagnosis not present

## 2024-01-26 DIAGNOSIS — M6281 Muscle weakness (generalized): Secondary | ICD-10-CM | POA: Diagnosis not present

## 2024-01-26 DIAGNOSIS — R2681 Unsteadiness on feet: Secondary | ICD-10-CM | POA: Insufficient documentation

## 2024-01-26 DIAGNOSIS — M542 Cervicalgia: Secondary | ICD-10-CM | POA: Insufficient documentation

## 2024-01-26 DIAGNOSIS — R296 Repeated falls: Secondary | ICD-10-CM | POA: Diagnosis not present

## 2024-01-26 DIAGNOSIS — R269 Unspecified abnormalities of gait and mobility: Secondary | ICD-10-CM | POA: Diagnosis not present

## 2024-01-26 DIAGNOSIS — S93401S Sprain of unspecified ligament of right ankle, sequela: Secondary | ICD-10-CM | POA: Insufficient documentation

## 2024-01-26 DIAGNOSIS — R278 Other lack of coordination: Secondary | ICD-10-CM | POA: Diagnosis not present

## 2024-01-26 DIAGNOSIS — M25571 Pain in right ankle and joints of right foot: Secondary | ICD-10-CM | POA: Diagnosis not present

## 2024-01-26 DIAGNOSIS — R2689 Other abnormalities of gait and mobility: Secondary | ICD-10-CM | POA: Diagnosis not present

## 2024-01-26 NOTE — Therapy (Signed)
 OUTPATIENT PHYSICAL THERAPY LOWER EXTREMITY EVALUATION   Patient Name: Denise Spencer MRN: [id number] DOB:1960/07/01, 63 y.o., female Today's Date: 01/27/2024  END OF SESSION:  PT End of Session - 01/26/24 1413     Visit Number 1    Number of Visits 24    Date for PT Re-Evaluation 04/19/24    Progress Note Due on Visit 10    PT Start Time 1400    PT Stop Time 1444    PT Time Calculation (min) 44 min    Equipment Utilized During Treatment Gait belt    Activity Tolerance Patient tolerated treatment well;No increased pain    Behavior During Therapy WFL for tasks assessed/performed          Past Medical History:  Diagnosis Date   Allergy 1970's   Anxiety    Arthritis    knees, lower back   Asthma    Bipolar disorder (HCC)    Bulging lumbar disc    L5-S1   COPD (chronic obstructive pulmonary disease) (HCC)    MILD   Depression    Dyspnea    Fibromyalgia    GERD (gastroesophageal reflux disease)    PONV (postoperative nausea and vomiting)    with hysterectomy only   Sleep apnea    pt no longer uses CPAP since losing weight and states that she currently has no issues   Wears dentures    full upper and lower   Past Surgical History:  Procedure Laterality Date   ABDOMINAL HYSTERECTOMY  2002   ABDOMINAL HYSTERECTOMY W/ PARTIAL VAGINACTOMY     ANTERIOR CERVICAL DECOMP/DISCECTOMY FUSION N/A 12/15/2022   Procedure: ANTERIOR CERVICAL DECOMPRESSION AND FUSION CERVICAL 5- CERVICAL 6 WITH INSTRUMENTATION AND ALLOGRAFT;  Surgeon: Beuford Anes, MD;  Location: [facility];  Service: Orthopedics;  Laterality: N/A;   CARDIAC CATHETERIZATION  2012   CESAREAN SECTION     COLONOSCOPY     COLONOSCOPY WITH PROPOFOL  N/A 11/01/2016   Procedure: COLONOSCOPY WITH PROPOFOL ;  Surgeon: Jinny Carmine, MD;  Location: [facility];  Service: Endoscopy;  Laterality: N/A;   COLONOSCOPY WITH PROPOFOL  N/A 05/22/2021   Procedure: COLONOSCOPY WITH BIOPSY;  Surgeon: Jinny Carmine, MD;  Location:  [facility];  Service: Endoscopy;  Laterality: N/A;   ESOPHAGOGASTRODUODENOSCOPY (EGD) WITH PROPOFOL  N/A 11/01/2016   Procedure: ESOPHAGOGASTRODUODENOSCOPY (EGD) WITH PROPOFOL ;  Surgeon: Jinny Carmine, MD;  Location: [facility];  Service: Endoscopy;  Laterality: N/A;   HIP ARTHROPLASTY Left 04/2020   JOINT REPLACEMENT  2021   POLYPECTOMY  11/01/2016   Procedure: POLYPECTOMY;  Surgeon: Jinny Carmine, MD;  Location: [facility];  Service: Endoscopy;;   POLYPECTOMY N/A 05/22/2021   Procedure: POLYPECTOMY;  Surgeon: Jinny Carmine, MD;  Location: [facility];  Service: Endoscopy;  Laterality: N/A;   SEPTOPLASTY Left 12/27/2016   Procedure: SEPTOPLASTY,ENDOSCOPIC TRIMMING MIDDLE TURBINATE;  Surgeon: Edda Mt, MD;  Location: [facility];  Service: ENT;  Laterality: Left;   SPINE SURGERY  2024   TUBAL LIGATION     WRIST GANGLION EXCISION     Patient Active Problem List   Diagnosis Date Noted   Stage 1 mild COPD by GOLD classification (HCC) 12/29/2023   Ulcer of right foot with fat layer exposed (HCC) 12/29/2023   Other hyperlipidemia 12/29/2023   Morbid obesity (HCC) 12/29/2023   Sleep apnea in adult 12/29/2023   Prediabetes 12/29/2023   Aspiration pneumonia of left lung (HCC)    Acute respiratory failure with hypoxia (HCC)    Polyp of  ascending colon    Bursitis of left hip 03/25/2021   Low back pain 10/22/2020   S/P hip replacement, left 04/25/2020   Primary osteoarthritis of left hip 01/11/2020   Peroneal tendinitis 08/30/2017   Degeneration of lumbar intervertebral disc 08/30/2017   Chondromalacia patellae 08/30/2017   Knee pain 08/30/2017   Ankle pain 08/30/2017   Anxiety 02/14/2017   History of colonic polyps    Benign neoplasm of transverse colon    Benign neoplasm of descending colon    Benign neoplasm of ascending colon    Problems with swallowing and mastication    Elevated liver enzymes 08/17/2016   Spasm 02/13/2014   Imbalance 02/13/2014    Fatigue 02/13/2014   Family history of MS (multiple sclerosis) 02/13/2014   Class 1 obesity 02/18/2013   Bipolar II disorder (HCC) 10/10/2012   Acid reflux 10/25/2011   CFIDS (chronic fatigue and immune dysfunction syndrome) (HCC) 10/25/2011   Abdominal pain 06/25/2011    PCP: Dr. Jolynn Spencer  REFERRING PROVIDER: Dr. Juliene Medicine   REFERRING DIAG: [phone] (ICD-10-CM) - Moderate right ankle sprain, sequela   THERAPY DIAG:  Abnormality of gait and mobility - Plan: PT plan of care cert/re-cert  Difficulty in walking, not elsewhere classified - Plan: PT plan of care cert/re-cert  Muscle weakness (generalized) - Plan: PT plan of care cert/re-cert  Other lack of coordination  Unsteadiness on feet - Plan: PT plan of care cert/re-cert  Pain in right ankle and joints of right foot  Rationale for Evaluation and Treatment: Rehabilitation  ONSET DATE: Since childhood  SUBJECTIVE:   SUBJECTIVE STATEMENT: I have been having Right ankle for many years- 2 fusion surgeries for R foot (last being 10/14/2023). Patient denies falling but report foot does not lift when walking and trips her up.   PERTINENT HISTORY: 10/14/2023-FUSION OF RIGHT GREAT TOE JOINT WITH BONE GRAFT FROM HEEL, SHORTENING OF 2,3 METATARSALS. 1st surgery was June 2023  Known Arthritis of 1st Metatarsophalangeal (MTP) joint of right foot, Metatarsal deformity of Right foot See above section for PMH.    PAIN:  Are you having pain? Yes: NPRS scale: 4/10 Right ankle; worst = 7/10  Pain location: R ankle joint - Global Pain description: ache Aggravating factors: Squat; attempting to stand on toes, excessive walking Relieving factors: Ice, Tylenol , Rest  PRECAUTIONS: Fall, Patient reports not supposed to go up onto tiptoes  RED FLAGS: None   WEIGHT BEARING RESTRICTIONS: No  FALLS:  Has patient fallen in last 6 months? No  LIVING ENVIRONMENT: Lives with: lives with their family-Husband and Son Lives in:  House/apartment Stairs: Yes: External: 2 STE in front; 1 STE in back- steps; iron Lattice Has following equipment at home: Single point cane, Walker - 2 wheeled, and bed side commode  OCCUPATION: Disabled- Cats (5); foster for animal  PLOF: Independent  PATIENT GOALS: To be pain free or as close as possible; improve my   NEXT MD VISIT: October 2025  OBJECTIVE:  Note: Objective measures were completed at Evaluation unless otherwise noted.  DIAGNOSTIC FINDINGS: Multiple view plain film radiographs: Correction maintained has complete consolidation of the fusion site and good consolidation of metatarsal osteotomies   PATIENT SURVEYS:  LEFS  Extreme difficulty/unable (0), Quite a bit of difficulty (1), Moderate difficulty (2), Little difficulty (3), No difficulty (4) Survey date:    Any of your usual work, housework or school activities   2. Usual hobbies, recreational or sporting activities   3. Getting into/out of the bath  4. Walking between rooms   5. Putting on socks/shoes   6. Squatting    7. Lifting an object, like a bag of groceries from the floor   8. Performing light activities around your home   9. Performing heavy activities around your home   10. Getting into/out of a car   11. Walking 2 blocks   12. Walking 1 mile   13. Going up/down 10 stairs (1 flight)   14. Standing for 1 hour   15.  sitting for 1 hour   16. Running on even ground   17. Running on uneven ground   18. Making sharp turns while running fast   19. Hopping    20. Rolling over in bed   Score total:  To be assessed next visit     COGNITION: Overall cognitive status: Within functional limits for tasks assessed     SENSATION: WFL  EDEMA:  None observed   POSTURE: rounded shoulders, forward head, and no significant LE issues  PALPATION: Gentle tenderness around anterior portion of ankle joint complex- most tenderness along ATF ligament   LOWER EXTREMITY ROM:  Active ROM Right eval  Left eval  Hip flexion    Hip extension    Hip abduction    Hip adduction    Hip internal rotation    Hip external rotation    Knee flexion    Knee extension    Ankle dorsiflexion 5   Ankle plantarflexion 32   Ankle inversion    Ankle eversion     (Blank rows = not tested)  LOWER EXTREMITY MMT:  MMT Right eval Left eval  Hip flexion 5 4  Hip extension    Hip abduction 4+ 4  Hip adduction    Hip internal rotation 4+ 4+  Hip external rotation    Knee flexion    Knee extension    Ankle dorsiflexion 2+ 4  Ankle plantarflexion NT NT  Ankle inversion 3+ 4  Ankle eversion 3+ 4   (Blank rows = not tested)  LOWER EXTREMITY SPECIAL TESTS:  Anterior Drawer (ATF, 10-15 plantarflexion with anterior translation): Negative Talar Tilt (CFL, inversion): Negative Eversion Stress Test (Deltoid, eversion): Will test next session  FUNCTIONAL TESTS:  5 times sit to stand: 22.52 sec without UE Support Timed up and go (TUG): 13.41; 13.2 sec without UE   6 Min Walk Test:  Instructed patient to ambulate as quickly and as safely as possible for 6 minutes using LRAD. Patient was allowed to take standing rest breaks without stopping the test, but if the patient required a sitting rest break the clock would be stopped and the test would be over.  Results: 1045 feet (319 meters, Avg speed 0.89 m/s) using no AD with Supervision. Results indicate that the patient has reduced endurance with ambulation compared to age matched norms.  Age Matched Norms: 94-69 yo M: 36 F: 59, 69-79 yo M: 75 F: 471, 46-89 yo M: 417 F: 392 MDC: 58.21 meters (190.98 feet) or 50 meters (ANPTA Core Set of Outcome Measures for Adults with Neurologic Conditions, 2018)  10 meter walk test: .88 m/s Berg Balance Scale: will assess next visit  GAIT: Distance walked: 1045 Assistive device utilized: None Level of assistance: SBA Comments: mild decreased right heel to toe and decreased foot clearance with fatigue.  TREATMENT DATE: 01/26/2024 PT eval only due to time. Will add HEP and further balance testing next session.    PATIENT EDUCATION:  Education details: Purpose of PT and plan of care Person educated: Patient Education method: Explanation Education comprehension: verbalized understanding  HOME EXERCISE PROGRAM: To be initiated next visit  ASSESSMENT:  CLINICAL IMPRESSION: Patient is a 63 y.o. female who was seen today for physical therapy evaluation and treatment for R ankle sprain. Patient present with painful R ankle mobility, R LE muscle weakness  and some impaired gait affecting her community walking ability. She will benefit from skilled PT services to improve her ankle ROM, pain relief, ankle stability/balance and improved functional mobility to enable her to return to previous level of function and decrease her risk of falling.   OBJECTIVE IMPAIRMENTS: Abnormal gait, decreased balance, decreased coordination, decreased endurance, decreased mobility, difficulty walking, decreased ROM, decreased strength, and pain.   ACTIVITY LIMITATIONS: lifting, bending, standing, squatting, stairs, and transfers  PARTICIPATION LIMITATIONS: laundry, shopping, community activity, and yard work  PERSONAL FACTORS: Time since onset of injury/illness/exacerbation and 1-2 comorbidities: multiple foot sx, arthritis are also affecting patient's functional outcome.   REHAB POTENTIAL: Good  CLINICAL DECISION MAKING: Stable/uncomplicated  EVALUATION COMPLEXITY: Low   GOALS: Goals reviewed with patient? Yes  SHORT TERM GOALS: Target date: 03/08/2024 Pt will be independent with HEP in order to decrease ankle pain and increase strength in order to improve pain-free function at home. Baseline: EVAL= No formal HEP in place Goal status: INITIAL   LONG TERM GOALS: Target date:  04/19/2024  Pt will increase LEFS by at least 9 points in order to demonstrate significant improvement in lower extremity function.  Baseline: EVAL - TO be administered next visit Goal status: INITIAL  2.  Pt will improve BERG by at least 3 points in order to demonstrate clinically significant improvement in balance.  Baseline: EVAL= To be assessed next visit Goal status: INITIAL  3.  Pt will decrease worst pain as reported on NPRS by at least 3 points in order to demonstrate clinically significant reduction in ankle/foot pain.  Baseline: EVAL= 7/10 right ankle at worst Goal status: INITIAL  4.  Pt will increase strength of  by at least 1/2 MMT grade in order to demonstrate improvement in strength and function  Baseline: Right ankle DF= 2+/5 Goal status: INITIAL  5.  Patient will demonstrated improved Right ankle DF ROM for improved heel to toe sequencing and less risk of foot drop.  Baseline: EVAL= R ankle DF= 5 deg Goal status: INITIAL  6.  Patient will improve 6 min walk test > 1250 feet without an AD for improved functional walking in the community.  Baseline: EVAL= 1045 feet Goal status: INITIAL   PLAN:  PT FREQUENCY: 1-2x/week  PT DURATION: 12 weeks  PLANNED INTERVENTIONS: 97164- PT Re-evaluation, 97750- Physical Performance Testing, 97110-Therapeutic exercises, 97530- Therapeutic activity, [id number]- Neuromuscular re-education, 97535- Self Care, [id number]- Manual therapy, [id number]- Gait training, [phone]- Orthotic Initial, [phone]- Orthotic/Prosthetic subsequent, [phone]- Canalith repositioning, [phone] (1-2 muscles), 20561 (3+ muscles)- Dry Needling, Patient/Family education, Balance training, Stair training, Taping, Joint mobilization, Joint manipulation, Compression bandaging, Vestibular training, DME instructions, Cryotherapy, and Moist heat  PLAN FOR NEXT SESSION:  BERG LEFS Initiate Ankle ROM/Strengthening and HEP Discuss with ordering MD if any precautions with Plantar flexion/calf  raises as patient reported she was previously instructed not to perform   Reyes LOISE London, PT 01/27/2024, 3:08 PM

## 2024-01-27 ENCOUNTER — Encounter: Payer: Self-pay | Admitting: Podiatry

## 2024-01-27 DIAGNOSIS — G4733 Obstructive sleep apnea (adult) (pediatric): Secondary | ICD-10-CM | POA: Diagnosis not present

## 2024-01-27 NOTE — Progress Notes (Signed)
  Subjective:  Patient ID: Denise Spencer, female    DOB: 1961/01/20,  MRN: [id number]  Chief Complaint  Patient presents with   Foot Pain    Rm 2 Patient is here for right index toe pain. Patient states index toe is rubbing against right hallux. Patient is concerned about callus on the ball of the left foot.     63 y.o. female returns for post-op check.  She returns for follow-up having some discomfort in the second toe on the right foot.  She also never started PT  Review of Systems: Negative except as noted in the HPI. Denies N/V/F/Ch.   Objective:   There were no vitals filed for this visit.  Body mass index is 33.05 kg/m. Constitutional Well developed. Well nourished.  Vascular Foot warm and well perfused. Capillary refill normal to all digits.  Calf is soft and supple, no posterior calf or knee pain, negative Homans' sign  Neurologic Normal speech. Oriented to person, place, and time. Epicritic sensation to light touch grossly present bilaterally.  Dermatologic Incision well-healed not hypertrophic  Orthopedic: The second toe is somewhat elongated compared to the first.  There is no pain at the surgical fusion site of the first.  Toe is semireducible in nature.  Ankle pain is still present but has improved some.   Multiple view plain film radiographs: Correction maintained has complete consolidation of the fusion site and good consolidation of metatarsal osteotomies Assessment:   1. Arthritis of first metatarsophalangeal (MTP) joint of right foot   2. Moderate right ankle sprain, sequela    Plan:  Patient was evaluated and treated and all questions answered.  S/p foot surgery right - Overall toe position is much improved from what it was preoperatively.  Most of the discomfort she is having is likely due to the elongated second toe.  I dispensed her a silicone offloading pad to trial this to see if this offloads and alleviates the concern.  Could consider possible  flexor tenotomy as well to reduce Balick contracture if not improving.  Long-term surgical arthrodesis of the second toe may be necessary to shorten the toe further.  Return in about 2 months (around 03/26/2024) for f/u right ankle and toe issue .

## 2024-01-30 DIAGNOSIS — Z7289 Other problems related to lifestyle: Secondary | ICD-10-CM | POA: Diagnosis not present

## 2024-01-30 DIAGNOSIS — G894 Chronic pain syndrome: Secondary | ICD-10-CM | POA: Diagnosis not present

## 2024-01-30 DIAGNOSIS — Z91199 Patient's noncompliance with other medical treatment and regimen due to unspecified reason: Secondary | ICD-10-CM | POA: Diagnosis not present

## 2024-01-31 ENCOUNTER — Ambulatory Visit

## 2024-01-31 ENCOUNTER — Telehealth: Payer: Self-pay | Admitting: Lab

## 2024-01-31 ENCOUNTER — Encounter: Payer: Self-pay | Admitting: Lab

## 2024-01-31 DIAGNOSIS — R269 Unspecified abnormalities of gait and mobility: Secondary | ICD-10-CM | POA: Diagnosis not present

## 2024-01-31 DIAGNOSIS — R278 Other lack of coordination: Secondary | ICD-10-CM | POA: Diagnosis not present

## 2024-01-31 DIAGNOSIS — M25571 Pain in right ankle and joints of right foot: Secondary | ICD-10-CM

## 2024-01-31 DIAGNOSIS — R262 Difficulty in walking, not elsewhere classified: Secondary | ICD-10-CM | POA: Diagnosis not present

## 2024-01-31 DIAGNOSIS — M6281 Muscle weakness (generalized): Secondary | ICD-10-CM

## 2024-01-31 DIAGNOSIS — R2681 Unsteadiness on feet: Secondary | ICD-10-CM

## 2024-01-31 DIAGNOSIS — S93401S Sprain of unspecified ligament of right ankle, sequela: Secondary | ICD-10-CM | POA: Diagnosis not present

## 2024-01-31 DIAGNOSIS — R296 Repeated falls: Secondary | ICD-10-CM | POA: Diagnosis not present

## 2024-01-31 DIAGNOSIS — M542 Cervicalgia: Secondary | ICD-10-CM | POA: Diagnosis not present

## 2024-01-31 DIAGNOSIS — R2689 Other abnormalities of gait and mobility: Secondary | ICD-10-CM | POA: Diagnosis not present

## 2024-01-31 NOTE — Therapy (Signed)
 OUTPATIENT PHYSICAL THERAPY LOWER EXTREMITY TREATMENT   Patient Name: Denise Spencer MRN: [id number] DOB:1960-11-08, 63 y.o., female Today's Date: 02/01/2024  END OF SESSION:  PT End of Session - 01/31/24 1131     Visit Number 2    Number of Visits 24    Date for PT Re-Evaluation 04/19/24    Progress Note Due on Visit 10    PT Start Time 1147    PT Stop Time 1229    PT Time Calculation (min) 42 min    Equipment Utilized During Treatment Gait belt    Activity Tolerance Patient tolerated treatment well;No increased pain    Behavior During Therapy WFL for tasks assessed/performed          Past Medical History:  Diagnosis Date   Allergy 1970's   Anxiety    Arthritis    knees, lower back   Asthma    Bipolar disorder (HCC)    Bulging lumbar disc    L5-S1   COPD (chronic obstructive pulmonary disease) (HCC)    MILD   Depression    Dyspnea    Fibromyalgia    GERD (gastroesophageal reflux disease)    PONV (postoperative nausea and vomiting)    with hysterectomy only   Sleep apnea    pt no longer uses CPAP since losing weight and states that she currently has no issues   Wears dentures    full upper and lower   Past Surgical History:  Procedure Laterality Date   ABDOMINAL HYSTERECTOMY  2002   ABDOMINAL HYSTERECTOMY W/ PARTIAL VAGINACTOMY     ANTERIOR CERVICAL DECOMP/DISCECTOMY FUSION N/A 12/15/2022   Procedure: ANTERIOR CERVICAL DECOMPRESSION AND FUSION CERVICAL 5- CERVICAL 6 WITH INSTRUMENTATION AND ALLOGRAFT;  Surgeon: Beuford Anes, MD;  Location: [facility];  Service: Orthopedics;  Laterality: N/A;   CARDIAC CATHETERIZATION  2012   CESAREAN SECTION     COLONOSCOPY     COLONOSCOPY WITH PROPOFOL  N/A 11/01/2016   Procedure: COLONOSCOPY WITH PROPOFOL ;  Surgeon: Jinny Carmine, MD;  Location: [facility];  Service: Endoscopy;  Laterality: N/A;   COLONOSCOPY WITH PROPOFOL  N/A 05/22/2021   Procedure: COLONOSCOPY WITH BIOPSY;  Surgeon: Jinny Carmine, MD;  Location:  [facility];  Service: Endoscopy;  Laterality: N/A;   ESOPHAGOGASTRODUODENOSCOPY (EGD) WITH PROPOFOL  N/A 11/01/2016   Procedure: ESOPHAGOGASTRODUODENOSCOPY (EGD) WITH PROPOFOL ;  Surgeon: Jinny Carmine, MD;  Location: [facility];  Service: Endoscopy;  Laterality: N/A;   HIP ARTHROPLASTY Left 04/2020   JOINT REPLACEMENT  2021   POLYPECTOMY  11/01/2016   Procedure: POLYPECTOMY;  Surgeon: Jinny Carmine, MD;  Location: [facility];  Service: Endoscopy;;   POLYPECTOMY N/A 05/22/2021   Procedure: POLYPECTOMY;  Surgeon: Jinny Carmine, MD;  Location: [facility];  Service: Endoscopy;  Laterality: N/A;   SEPTOPLASTY Left 12/27/2016   Procedure: SEPTOPLASTY,ENDOSCOPIC TRIMMING MIDDLE TURBINATE;  Surgeon: Edda Mt, MD;  Location: [facility] ORS;  Service: ENT;  Laterality: Left;   SPINE SURGERY  2024   TUBAL LIGATION     WRIST GANGLION EXCISION     Patient Active Problem List   Diagnosis Date Noted   Stage 1 mild COPD by GOLD classification (HCC) 12/29/2023   Ulcer of right foot with fat layer exposed (HCC) 12/29/2023   Other hyperlipidemia 12/29/2023   Morbid obesity (HCC) 12/29/2023   Sleep apnea in adult 12/29/2023   Prediabetes 12/29/2023   Aspiration pneumonia of left lung (HCC)    Acute respiratory failure with hypoxia (HCC)    Polyp of  ascending colon    Bursitis of left hip 03/25/2021   Low back pain 10/22/2020   S/P hip replacement, left 04/25/2020   Primary osteoarthritis of left hip 01/11/2020   Peroneal tendinitis 08/30/2017   Degeneration of lumbar intervertebral disc 08/30/2017   Chondromalacia patellae 08/30/2017   Knee pain 08/30/2017   Ankle pain 08/30/2017   Anxiety 02/14/2017   History of colonic polyps    Benign neoplasm of transverse colon    Benign neoplasm of descending colon    Benign neoplasm of ascending colon    Problems with swallowing and mastication    Elevated liver enzymes 08/17/2016   Spasm 02/13/2014   Imbalance 02/13/2014    Fatigue 02/13/2014   Family history of MS (multiple sclerosis) 02/13/2014   Class 1 obesity 02/18/2013   Bipolar II disorder (HCC) 10/10/2012   Acid reflux 10/25/2011   CFIDS (chronic fatigue and immune dysfunction syndrome) (HCC) 10/25/2011   Abdominal pain 06/25/2011    PCP: Dr. Jolynn Spencer  REFERRING PROVIDER: Dr. Juliene Medicine   REFERRING DIAG: [phone] (ICD-10-CM) - Moderate right ankle sprain, sequela   THERAPY DIAG:  Abnormality of gait and mobility  Difficulty in walking, not elsewhere classified  Muscle weakness (generalized)  Other lack of coordination  Unsteadiness on feet  Pain in right ankle and joints of right foot  Rationale for Evaluation and Treatment: Rehabilitation  ONSET DATE: Since childhood  SUBJECTIVE:   SUBJECTIVE STATEMENT:  TODAY: My left hip is always hurting and my ankle is sore today but I'm doing okay otherwise   From EVAL: I have been having Right ankle for many years- 2 fusion surgeries for R foot (last being 10/14/2023). Patient denies falling but report foot does not lift when walking and trips her up.   PERTINENT HISTORY: 10/14/2023-FUSION OF RIGHT GREAT TOE JOINT WITH BONE GRAFT FROM HEEL, SHORTENING OF 2,3 METATARSALS. 1st surgery was June 2023  Known Arthritis of 1st Metatarsophalangeal (MTP) joint of right foot, Metatarsal deformity of Right foot See above section for PMH.    PAIN:  Are you having pain? Yes: NPRS scale: 4/10 Right ankle; worst = 7/10  Pain location: R ankle joint - Global Pain description: ache Aggravating factors: Squat; attempting to stand on toes, excessive walking Relieving factors: Ice, Tylenol , Rest  PRECAUTIONS: Fall, Patient reports not supposed to go up onto tiptoes  RED FLAGS: None   WEIGHT BEARING RESTRICTIONS: No  FALLS:  Has patient fallen in last 6 months? No  LIVING ENVIRONMENT: Lives with: lives with their family-Husband and Son Lives in: House/apartment Stairs: Yes: External:  2 STE in front; 1 STE in back- steps; iron Lattice Has following equipment at home: Single point cane, Walker - 2 wheeled, and bed side commode  OCCUPATION: Disabled- Cats (5); foster for animal  PLOF: Independent  PATIENT GOALS: To be pain free or as close as possible; improve my   NEXT MD VISIT: October 2025  OBJECTIVE:  Note: Objective measures were completed at Evaluation unless otherwise noted.  DIAGNOSTIC FINDINGS: Multiple view plain film radiographs: Correction maintained has complete consolidation of the fusion site and good consolidation of metatarsal osteotomies   PATIENT SURVEYS:  LEFS  Extreme difficulty/unable (0), Quite a bit of difficulty (1), Moderate difficulty (2), Little difficulty (3), No difficulty (4) Survey date:    Any of your usual work, housework or school activities   2. Usual hobbies, recreational or sporting activities   3. Getting into/out of the bath   4. Walking between rooms  5. Putting on socks/shoes   6. Squatting    7. Lifting an object, like a bag of groceries from the floor   8. Performing light activities around your home   9. Performing heavy activities around your home   10. Getting into/out of a car   11. Walking 2 blocks   12. Walking 1 mile   13. Going up/down 10 stairs (1 flight)   14. Standing for 1 hour   15.  sitting for 1 hour   16. Running on even ground   17. Running on uneven ground   18. Making sharp turns while running fast   19. Hopping    20. Rolling over in bed   Score total:  To be assessed next visit     COGNITION: Overall cognitive status: Within functional limits for tasks assessed     SENSATION: WFL  EDEMA:  None observed   POSTURE: rounded shoulders, forward head, and no significant LE issues  PALPATION: Gentle tenderness around anterior portion of ankle joint complex- most tenderness along ATF ligament   LOWER EXTREMITY ROM:  Active ROM Right eval Left eval  Hip flexion    Hip extension     Hip abduction    Hip adduction    Hip internal rotation    Hip external rotation    Knee flexion    Knee extension    Ankle dorsiflexion 5   Ankle plantarflexion 32   Ankle inversion    Ankle eversion     (Blank rows = not tested)  LOWER EXTREMITY MMT:  MMT Right eval Left eval  Hip flexion 5 4  Hip extension    Hip abduction 4+ 4  Hip adduction    Hip internal rotation 4+ 4+  Hip external rotation    Knee flexion    Knee extension    Ankle dorsiflexion 2+ 4  Ankle plantarflexion NT NT  Ankle inversion 3+ 4  Ankle eversion 3+ 4   (Blank rows = not tested)  LOWER EXTREMITY SPECIAL TESTS:  Anterior Drawer (ATF, 10-15 plantarflexion with anterior translation): Negative Talar Tilt (CFL, inversion): Negative Eversion Stress Test (Deltoid, eversion): Will test next session  FUNCTIONAL TESTS:  5 times sit to stand: 22.52 sec without UE Support Timed up and go (TUG): 13.41; 13.2 sec without UE   6 Min Walk Test:  Instructed patient to ambulate as quickly and as safely as possible for 6 minutes using LRAD. Patient was allowed to take standing rest breaks without stopping the test, but if the patient required a sitting rest break the clock would be stopped and the test would be over.  Results: 1045 feet (319 meters, Avg speed 0.89 m/s) using no AD with Supervision. Results indicate that the patient has reduced endurance with ambulation compared to age matched norms.  Age Matched Norms: 20-69 yo M: 77 F: 9, 50-79 yo M: 34 F: 471, 75-89 yo M: 417 F: 392 MDC: 58.21 meters (190.98 feet) or 50 meters (ANPTA Core Set of Outcome Measures for Adults with Neurologic Conditions, 2018)  10 meter walk test: .88 m/s Berg Balance Scale: will assess next visit  GAIT: Distance walked: 1045 Assistive device utilized: None Level of assistance: SBA Comments: mild decreased right heel to toe and decreased foot clearance with fatigue.  TREATMENT DATE: 01/31/2024   Extreme difficulty/unable (0), Quite a bit of difficulty (1), Moderate difficulty (2), Little difficulty (3), No difficulty (4) Survey date:  01/31/2024  Any of your usual work, housework or school activities 4  2. Usual hobbies, recreational or sporting activities 2  3. Getting into/out of the bath 4  4. Walking between rooms 3  5. Putting on socks/shoes 4  6. Squatting  0  7. Lifting an object, like a bag of groceries from the floor 2  8. Performing light activities around your home 4  9. Performing heavy activities around your home 2  10. Getting into/out of a car 2  11. Walking 2 blocks 0  12. Walking 1 mile 0  13. Going up/down 10 stairs (1 flight) 0  14. Standing for 1 hour 0  15.  sitting for 1 hour 4  16. Running on even ground 0  17. Running on uneven ground 0  18. Making sharp turns while running fast 0  19. Hopping  0  20. Rolling over in bed 1  Score total:  28/80     [facility] PT Assessment - 01/31/24 1138       Standardized Balance Assessment   Standardized Balance Assessment Berg Balance Test      Berg Balance Test   Sit to Stand Able to stand without using hands and stabilize independently    Standing Unsupported Able to stand safely 2 minutes    Sitting with Back Unsupported but Feet Supported on Floor or Stool Able to sit safely and securely 2 minutes    Stand to Sit Sits safely with minimal use of hands    Transfers Able to transfer safely, minor use of hands    Standing Unsupported with Eyes Closed Able to stand 10 seconds safely    Standing Unsupported with Feet Together Able to place feet together independently and stand for 1 minute with supervision    From Standing, Reach Forward with Outstretched Arm Can reach forward >5 cm safely (2)    From Standing Position, Pick up Object from Floor Able to pick up shoe safely and easily    From Standing  Position, Turn to Look Behind Over each Shoulder Turn sideways only but maintains balance    Turn 360 Degrees Able to turn 360 degrees safely in 4 seconds or less    Standing Unsupported, Alternately Place Feet on Step/Stool Able to stand independently and safely and complete 8 steps in 20 seconds    Standing Unsupported, One Foot in Front Able to take small step independently and hold 30 seconds    Standing on One Leg Tries to lift leg/unable to hold 3 seconds but remains standing independently    Total Score 46          Self-care/Home management: R LE Added below mentioned ankle activities - Supine ankle Inversion/Eversion-2 x 10  -Supine ankle DF/PF 2 x 10  -Seated towel scrunches x 10  -Toe splay x 10 reps -Seated arch lifts x 10 reps  *PC to Dr. Silva office- Left vm requesting info on any precautions/Restrictions with R ankle.     PATIENT EDUCATION:  Education details: Purpose of PT and plan of care Person educated: Patient Education method: Explanation Education comprehension: verbalized understanding  HOME EXERCISE PROGRAM: Access Code: [email or web address] URL: [email or web address] Date: 01/31/2024 Prepared by: Reyes London  Exercises - Supine Ankle Inversion Eversion AROM  - 1 x daily - 3 sets - 10 reps - Supine Ankle Pumps  -  1 x daily - 3 sets - 10 reps - Supine Ankle Circles  - 1 x daily - 3 sets - 10 reps - Seated Toe Towel Scrunches  - 3 x weekly - 3 sets - 10 reps - Toe Spreading  - 3 x weekly - 3 sets - 10 reps - Seated Arch Lifts  - 3 x weekly - 3 sets - 10 reps ASSESSMENT:  CLINICAL IMPRESSION: Patient is a 63 y.o. female who was seen today for physical therapy treatment for R ankle sprain. Patient was further assessed with LEFS and presents with decreased self perceived functional ability. She also presents with impaired balance scoring 46/56 on BERG placing her at increased risk of falling.  She was introduced to some ankle ROM today and  foot intrinsic exercises and added to HEP.  RECEIVED  notice from Dr. Silva office later that there are no restrictions at this time. She will benefit from skilled PT services to improve her ankle ROM, pain relief, ankle stability/balance and improved functional mobility to enable her to return to previous level of function and decrease her risk of falling.   OBJECTIVE IMPAIRMENTS: Abnormal gait, decreased balance, decreased coordination, decreased endurance, decreased mobility, difficulty walking, decreased ROM, decreased strength, and pain.   ACTIVITY LIMITATIONS: lifting, bending, standing, squatting, stairs, and transfers  PARTICIPATION LIMITATIONS: laundry, shopping, community activity, and yard work  PERSONAL FACTORS: Time since onset of injury/illness/exacerbation and 1-2 comorbidities: multiple foot sx, arthritis are also affecting patient's functional outcome.   REHAB POTENTIAL: Good  CLINICAL DECISION MAKING: Stable/uncomplicated  EVALUATION COMPLEXITY: Low   GOALS: Goals reviewed with patient? Yes  SHORT TERM GOALS: Target date: 03/08/2024 Pt will be independent with HEP in order to decrease ankle pain and increase strength in order to improve pain-free function at home. Baseline: EVAL= No formal HEP in place Goal status: INITIAL   LONG TERM GOALS: Target date: 04/19/2024  Pt will increase LEFS by at least 9 points in order to demonstrate significant improvement in lower extremity function.  Baseline: EVAL - TO be administered next visit Goal status: INITIAL  2.  Pt will improve BERG by at least 3 points in order to demonstrate clinically significant improvement in balance.  Baseline: EVAL= To be assessed next visit Goal status: INITIAL  3.  Pt will decrease worst pain as reported on NPRS by at least 3 points in order to demonstrate clinically significant reduction in ankle/foot pain.  Baseline: EVAL= 7/10 right ankle at worst Goal status: INITIAL  4.  Pt will  increase strength of  by at least 1/2 MMT grade in order to demonstrate improvement in strength and function  Baseline: Right ankle DF= 2+/5 Goal status: INITIAL  5.  Patient will demonstrated improved Right ankle DF ROM for improved heel to toe sequencing and less risk of foot drop.  Baseline: EVAL= R ankle DF= 5 deg Goal status: INITIAL  6.  Patient will improve 6 min walk test > 1250 feet without an AD for improved functional walking in the community.  Baseline: EVAL= 1045 feet Goal status: INITIAL   PLAN:  PT FREQUENCY: 1-2x/week  PT DURATION: 12 weeks  PLANNED INTERVENTIONS: [id number]- PT Re-evaluation, 97750- Physical Performance Testing, 97110-Therapeutic exercises, 97530- Therapeutic activity, [id number]- Neuromuscular re-education, 97535- Self Care, [id number]- Manual therapy, [id number]- Gait training, [phone]- Orthotic Initial, [phone]- Orthotic/Prosthetic subsequent, [phone]- Canalith repositioning, [phone] (1-2 muscles), 20561 (3+ muscles)- Dry Needling, Patient/Family education, Balance training, Stair training, Taping, Joint mobilization, Joint manipulation, Compression bandaging, Vestibular training, DME  instructions, Cryotherapy, and Moist heat  PLAN FOR NEXT SESSION:  PROGRESS Ankle ROM/Strengthening and HEP Balance training and LE strengthening.    Reyes LOISE London, PT 02/01/2024, 8:07 AM

## 2024-01-31 NOTE — Telephone Encounter (Signed)
 Thanks contacted physical therapy and informed.

## 2024-01-31 NOTE — Progress Notes (Signed)
 error

## 2024-01-31 NOTE — Telephone Encounter (Signed)
 Chyrl from physical therapy is calling regarding restrictions of physical therapy patient is stating she has restrictions and he needs clarification please call. [phone] or [phone]

## 2024-01-31 NOTE — Therapy (Signed)
 OUTPATIENT PHYSICAL THERAPY LOWER EXTREMITY TREATMENT   Patient Name: Denise Spencer MRN: [id number] DOB:02-Jul-1960, 63 y.o., female Today's Date: 02/01/2024  END OF SESSION:  PT End of Session - 01/31/24 1131     Visit Number 2    Number of Visits 24    Date for PT Re-Evaluation 04/19/24    Progress Note Due on Visit 10    PT Start Time 1147    PT Stop Time 1229    PT Time Calculation (min) 42 min    Equipment Utilized During Treatment Gait belt    Activity Tolerance Patient tolerated treatment well;No increased pain    Behavior During Therapy WFL for tasks assessed/performed          Past Medical History:  Diagnosis Date   Allergy 1970's   Anxiety    Arthritis    knees, lower back   Asthma    Bipolar disorder (HCC)    Bulging lumbar disc    L5-S1   COPD (chronic obstructive pulmonary disease) (HCC)    MILD   Depression    Dyspnea    Fibromyalgia    GERD (gastroesophageal reflux disease)    PONV (postoperative nausea and vomiting)    with hysterectomy only   Sleep apnea    pt no longer uses CPAP since losing weight and states that she currently has no issues   Wears dentures    full upper and lower   Past Surgical History:  Procedure Laterality Date   ABDOMINAL HYSTERECTOMY  2002   ABDOMINAL HYSTERECTOMY W/ PARTIAL VAGINACTOMY     ANTERIOR CERVICAL DECOMP/DISCECTOMY FUSION N/A 12/15/2022   Procedure: ANTERIOR CERVICAL DECOMPRESSION AND FUSION CERVICAL 5- CERVICAL 6 WITH INSTRUMENTATION AND ALLOGRAFT;  Surgeon: Beuford Anes, MD;  Location: [facility];  Service: Orthopedics;  Laterality: N/A;   CARDIAC CATHETERIZATION  2012   CESAREAN SECTION     COLONOSCOPY     COLONOSCOPY WITH PROPOFOL  N/A 11/01/2016   Procedure: COLONOSCOPY WITH PROPOFOL ;  Surgeon: Jinny Carmine, MD;  Location: [facility];  Service: Endoscopy;  Laterality: N/A;   COLONOSCOPY WITH PROPOFOL  N/A 05/22/2021   Procedure: COLONOSCOPY WITH BIOPSY;  Surgeon: Jinny Carmine, MD;  Location:  [facility];  Service: Endoscopy;  Laterality: N/A;   ESOPHAGOGASTRODUODENOSCOPY (EGD) WITH PROPOFOL  N/A 11/01/2016   Procedure: ESOPHAGOGASTRODUODENOSCOPY (EGD) WITH PROPOFOL ;  Surgeon: Jinny Carmine, MD;  Location: [facility];  Service: Endoscopy;  Laterality: N/A;   HIP ARTHROPLASTY Left 04/2020   JOINT REPLACEMENT  2021   POLYPECTOMY  11/01/2016   Procedure: POLYPECTOMY;  Surgeon: Jinny Carmine, MD;  Location: [facility];  Service: Endoscopy;;   POLYPECTOMY N/A 05/22/2021   Procedure: POLYPECTOMY;  Surgeon: Jinny Carmine, MD;  Location: [facility];  Service: Endoscopy;  Laterality: N/A;   SEPTOPLASTY Left 12/27/2016   Procedure: SEPTOPLASTY,ENDOSCOPIC TRIMMING MIDDLE TURBINATE;  Surgeon: Edda Mt, MD;  Location: [facility];  Service: ENT;  Laterality: Left;   SPINE SURGERY  2024   TUBAL LIGATION     WRIST GANGLION EXCISION     Patient Active Problem List   Diagnosis Date Noted   Stage 1 mild COPD by GOLD classification (HCC) 12/29/2023   Ulcer of right foot with fat layer exposed (HCC) 12/29/2023   Other hyperlipidemia 12/29/2023   Morbid obesity (HCC) 12/29/2023   Sleep apnea in adult 12/29/2023   Prediabetes 12/29/2023   Aspiration pneumonia of left lung (HCC)    Acute respiratory failure with hypoxia (HCC)    Polyp of  ascending colon    Bursitis of left hip 03/25/2021   Low back pain 10/22/2020   S/P hip replacement, left 04/25/2020   Primary osteoarthritis of left hip 01/11/2020   Peroneal tendinitis 08/30/2017   Degeneration of lumbar intervertebral disc 08/30/2017   Chondromalacia patellae 08/30/2017   Knee pain 08/30/2017   Ankle pain 08/30/2017   Anxiety 02/14/2017   History of colonic polyps    Benign neoplasm of transverse colon    Benign neoplasm of descending colon    Benign neoplasm of ascending colon    Problems with swallowing and mastication    Elevated liver enzymes 08/17/2016   Spasm 02/13/2014   Imbalance 02/13/2014    Fatigue 02/13/2014   Family history of MS (multiple sclerosis) 02/13/2014   Class 1 obesity 02/18/2013   Bipolar II disorder (HCC) 10/10/2012   Acid reflux 10/25/2011   CFIDS (chronic fatigue and immune dysfunction syndrome) (HCC) 10/25/2011   Abdominal pain 06/25/2011    PCP: Dr. Jolynn Spencer  REFERRING PROVIDER: Dr. Juliene Medicine   REFERRING DIAG: [phone] (ICD-10-CM) - Moderate right ankle sprain, sequela   THERAPY DIAG:  Abnormality of gait and mobility  Difficulty in walking, not elsewhere classified  Muscle weakness (generalized)  Other lack of coordination  Unsteadiness on feet  Pain in right ankle and joints of right foot  Rationale for Evaluation and Treatment: Rehabilitation  ONSET DATE: Since childhood  SUBJECTIVE:   SUBJECTIVE STATEMENT:  TODAY: My left hip is always hurting and my ankle is sore today but I'm doing okay otherwise   From EVAL: I have been having Right ankle for many years- 2 fusion surgeries for R foot (last being 10/14/2023). Patient denies falling but report foot does not lift when walking and trips her up.   PERTINENT HISTORY: 10/14/2023-FUSION OF RIGHT GREAT TOE JOINT WITH BONE GRAFT FROM HEEL, SHORTENING OF 2,3 METATARSALS. 1st surgery was June 2023  Known Arthritis of 1st Metatarsophalangeal (MTP) joint of right foot, Metatarsal deformity of Right foot See above section for PMH.    PAIN:  Are you having pain? Yes: NPRS scale: 4/10 Right ankle; worst = 7/10  Pain location: R ankle joint - Global Pain description: ache Aggravating factors: Squat; attempting to stand on toes, excessive walking Relieving factors: Ice, Tylenol , Rest  PRECAUTIONS: Fall, Patient reports not supposed to go up onto tiptoes  RED FLAGS: None   WEIGHT BEARING RESTRICTIONS: No  FALLS:  Has patient fallen in last 6 months? No  LIVING ENVIRONMENT: Lives with: lives with their family-Husband and Son Lives in: House/apartment Stairs: Yes: External:  2 STE in front; 1 STE in back- steps; iron Lattice Has following equipment at home: Single point cane, Walker - 2 wheeled, and bed side commode  OCCUPATION: Disabled- Cats (5); foster for animal  PLOF: Independent  PATIENT GOALS: To be pain free or as close as possible; improve my   NEXT MD VISIT: October 2025  OBJECTIVE:  Note: Objective measures were completed at Evaluation unless otherwise noted.  DIAGNOSTIC FINDINGS: Multiple view plain film radiographs: Correction maintained has complete consolidation of the fusion site and good consolidation of metatarsal osteotomies   PATIENT SURVEYS:  LEFS  Extreme difficulty/unable (0), Quite a bit of difficulty (1), Moderate difficulty (2), Little difficulty (3), No difficulty (4) Survey date:    Any of your usual work, housework or school activities   2. Usual hobbies, recreational or sporting activities   3. Getting into/out of the bath   4. Walking between rooms  5. Putting on socks/shoes   6. Squatting    7. Lifting an object, like a bag of groceries from the floor   8. Performing light activities around your home   9. Performing heavy activities around your home   10. Getting into/out of a car   11. Walking 2 blocks   12. Walking 1 mile   13. Going up/down 10 stairs (1 flight)   14. Standing for 1 hour   15.  sitting for 1 hour   16. Running on even ground   17. Running on uneven ground   18. Making sharp turns while running fast   19. Hopping    20. Rolling over in bed   Score total:  To be assessed next visit     COGNITION: Overall cognitive status: Within functional limits for tasks assessed     SENSATION: WFL  EDEMA:  None observed   POSTURE: rounded shoulders, forward head, and no significant LE issues  PALPATION: Gentle tenderness around anterior portion of ankle joint complex- most tenderness along ATF ligament   LOWER EXTREMITY ROM:  Active ROM Right eval Left eval  Hip flexion    Hip extension     Hip abduction    Hip adduction    Hip internal rotation    Hip external rotation    Knee flexion    Knee extension    Ankle dorsiflexion 5   Ankle plantarflexion 32   Ankle inversion    Ankle eversion     (Blank rows = not tested)  LOWER EXTREMITY MMT:  MMT Right eval Left eval  Hip flexion 5 4  Hip extension    Hip abduction 4+ 4  Hip adduction    Hip internal rotation 4+ 4+  Hip external rotation    Knee flexion    Knee extension    Ankle dorsiflexion 2+ 4  Ankle plantarflexion NT NT  Ankle inversion 3+ 4  Ankle eversion 3+ 4   (Blank rows = not tested)  LOWER EXTREMITY SPECIAL TESTS:  Anterior Drawer (ATF, 10-15 plantarflexion with anterior translation): Negative Talar Tilt (CFL, inversion): Negative Eversion Stress Test (Deltoid, eversion): Will test next session  FUNCTIONAL TESTS:  5 times sit to stand: 22.52 sec without UE Support Timed up and go (TUG): 13.41; 13.2 sec without UE   6 Min Walk Test:  Instructed patient to ambulate as quickly and as safely as possible for 6 minutes using LRAD. Patient was allowed to take standing rest breaks without stopping the test, but if the patient required a sitting rest break the clock would be stopped and the test would be over.  Results: 1045 feet (319 meters, Avg speed 0.89 m/s) using no AD with Supervision. Results indicate that the patient has reduced endurance with ambulation compared to age matched norms.  Age Matched Norms: 77-69 yo M: 77 F: 8, 33-79 yo M: 56 F: 471, 71-89 yo M: 417 F: 392 MDC: 58.21 meters (190.98 feet) or 50 meters (ANPTA Core Set of Outcome Measures for Adults with Neurologic Conditions, 2018)  10 meter walk test: .88 m/s Berg Balance Scale: will assess next visit  GAIT: Distance walked: 1045 Assistive device utilized: None Level of assistance: SBA Comments: mild decreased right heel to toe and decreased foot clearance with fatigue.  TREATMENT DATE: 01/31/2024   Extreme difficulty/unable (0), Quite a bit of difficulty (1), Moderate difficulty (2), Little difficulty (3), No difficulty (4) Survey date:  01/31/2024  Any of your usual work, housework or school activities 4  2. Usual hobbies, recreational or sporting activities 2  3. Getting into/out of the bath 4  4. Walking between rooms 3  5. Putting on socks/shoes 4  6. Squatting  0  7. Lifting an object, like a bag of groceries from the floor 2  8. Performing light activities around your home 4  9. Performing heavy activities around your home 2  10. Getting into/out of a car 2  11. Walking 2 blocks 0  12. Walking 1 mile 0  13. Going up/down 10 stairs (1 flight) 0  14. Standing for 1 hour 0  15.  sitting for 1 hour 4  16. Running on even ground 0  17. Running on uneven ground 0  18. Making sharp turns while running fast 0  19. Hopping  0  20. Rolling over in bed 1  Score total:  28/80     [facility] PT Assessment - 01/31/24 1138       Standardized Balance Assessment   Standardized Balance Assessment Berg Balance Test      Berg Balance Test   Sit to Stand Able to stand without using hands and stabilize independently    Standing Unsupported Able to stand safely 2 minutes    Sitting with Back Unsupported but Feet Supported on Floor or Stool Able to sit safely and securely 2 minutes    Stand to Sit Sits safely with minimal use of hands    Transfers Able to transfer safely, minor use of hands    Standing Unsupported with Eyes Closed Able to stand 10 seconds safely    Standing Unsupported with Feet Together Able to place feet together independently and stand for 1 minute with supervision    From Standing, Reach Forward with Outstretched Arm Can reach forward >5 cm safely (2)    From Standing Position, Pick up Object from Floor Able to pick up shoe safely and easily    From Standing  Position, Turn to Look Behind Over each Shoulder Turn sideways only but maintains balance    Turn 360 Degrees Able to turn 360 degrees safely in 4 seconds or less    Standing Unsupported, Alternately Place Feet on Step/Stool Able to stand independently and safely and complete 8 steps in 20 seconds    Standing Unsupported, One Foot in Front Able to take small step independently and hold 30 seconds    Standing on One Leg Tries to lift leg/unable to hold 3 seconds but remains standing independently    Total Score 46          Self-care/Home management: R LE Added below mentioned ankle activities - Supine ankle Inversion/Eversion-2 x 10  -Supine ankle DF/PF 2 x 10  -Seated towel scrunches x 10  -Toe splay x 10 reps -Seated arch lifts x 10 reps  *PC to Dr. Silva office- Left vm requesting info on any precautions/Restrictions with R ankle.     PATIENT EDUCATION:  Education details: Purpose of PT and plan of care Person educated: Patient Education method: Explanation Education comprehension: verbalized understanding  HOME EXERCISE PROGRAM: Access Code: [email or web address] URL: [email or web address] Date: 01/31/2024 Prepared by: Reyes London  Exercises - Supine Ankle Inversion Eversion AROM  - 1 x daily - 3 sets - 10 reps - Supine Ankle Pumps  -  1 x daily - 3 sets - 10 reps - Supine Ankle Circles  - 1 x daily - 3 sets - 10 reps - Seated Toe Towel Scrunches  - 3 x weekly - 3 sets - 10 reps - Toe Spreading  - 3 x weekly - 3 sets - 10 reps - Seated Arch Lifts  - 3 x weekly - 3 sets - 10 reps ASSESSMENT:  CLINICAL IMPRESSION: Patient is a 63 y.o. female who was seen today for physical therapy treatment for R ankle sprain. Patient was further assessed with LEFS and presents with decreased self perceived functional ability. She also presents with impaired balance scoring 46/56 on BERG placing her at increased risk of falling.  She was introduced to some ankle ROM today and  foot intrinsic exercises and added to HEP.  RECEIVED  notice from Dr. Silva office later that there are no restrictions at this time. She will benefit from skilled PT services to improve her ankle ROM, pain relief, ankle stability/balance and improved functional mobility to enable her to return to previous level of function and decrease her risk of falling.   OBJECTIVE IMPAIRMENTS: Abnormal gait, decreased balance, decreased coordination, decreased endurance, decreased mobility, difficulty walking, decreased ROM, decreased strength, and pain.   ACTIVITY LIMITATIONS: lifting, bending, standing, squatting, stairs, and transfers  PARTICIPATION LIMITATIONS: laundry, shopping, community activity, and yard work  PERSONAL FACTORS: Time since onset of injury/illness/exacerbation and 1-2 comorbidities: multiple foot sx, arthritis are also affecting patient's functional outcome.   REHAB POTENTIAL: Good  CLINICAL DECISION MAKING: Stable/uncomplicated  EVALUATION COMPLEXITY: Low   GOALS: Goals reviewed with patient? Yes  SHORT TERM GOALS: Target date: 03/08/2024 Pt will be independent with HEP in order to decrease ankle pain and increase strength in order to improve pain-free function at home. Baseline: EVAL= No formal HEP in place Goal status: INITIAL   LONG TERM GOALS: Target date: 04/19/2024  Pt will increase LEFS by at least 9 points in order to demonstrate significant improvement in lower extremity function.  Baseline: EVAL - TO be administered next visit Goal status: INITIAL  2.  Pt will improve BERG by at least 3 points in order to demonstrate clinically significant improvement in balance.  Baseline: EVAL= To be assessed next visit Goal status: INITIAL  3.  Pt will decrease worst pain as reported on NPRS by at least 3 points in order to demonstrate clinically significant reduction in ankle/foot pain.  Baseline: EVAL= 7/10 right ankle at worst Goal status: INITIAL  4.  Pt will  increase strength of  by at least 1/2 MMT grade in order to demonstrate improvement in strength and function  Baseline: Right ankle DF= 2+/5 Goal status: INITIAL  5.  Patient will demonstrated improved Right ankle DF ROM for improved heel to toe sequencing and less risk of foot drop.  Baseline: EVAL= R ankle DF= 5 deg Goal status: INITIAL  6.  Patient will improve 6 min walk test > 1250 feet without an AD for improved functional walking in the community.  Baseline: EVAL= 1045 feet Goal status: INITIAL   PLAN:  PT FREQUENCY: 1-2x/week  PT DURATION: 12 weeks  PLANNED INTERVENTIONS: [id number]- PT Re-evaluation, 97750- Physical Performance Testing, 97110-Therapeutic exercises, 97530- Therapeutic activity, [id number]- Neuromuscular re-education, 97535- Self Care, [id number]- Manual therapy, [id number]- Gait training, [phone]- Orthotic Initial, [phone]- Orthotic/Prosthetic subsequent, [phone]- Canalith repositioning, [phone] (1-2 muscles), [id number] (3+ muscles)- Dry Needling, Patient/Family education, Balance training, Stair training, Taping, Joint mobilization, Joint manipulation, Compression bandaging, Vestibular training, DME  instructions, Cryotherapy, and Moist heat  PLAN FOR NEXT SESSION:  PROGRESS Ankle ROM/Strengthening and HEP Balance training and LE strengthening.    Reyes LOISE London, PT 02/01/2024, 8:07 AM

## 2024-01-31 NOTE — Progress Notes (Signed)
 error    This encounter was created in error - please disregard.

## 2024-02-01 DIAGNOSIS — L918 Other hypertrophic disorders of the skin: Secondary | ICD-10-CM | POA: Diagnosis not present

## 2024-02-01 DIAGNOSIS — L821 Other seborrheic keratosis: Secondary | ICD-10-CM | POA: Diagnosis not present

## 2024-02-01 DIAGNOSIS — L249 Irritant contact dermatitis, unspecified cause: Secondary | ICD-10-CM | POA: Diagnosis not present

## 2024-02-03 ENCOUNTER — Ambulatory Visit

## 2024-02-03 DIAGNOSIS — M6281 Muscle weakness (generalized): Secondary | ICD-10-CM | POA: Diagnosis not present

## 2024-02-03 DIAGNOSIS — R2681 Unsteadiness on feet: Secondary | ICD-10-CM

## 2024-02-03 DIAGNOSIS — R2689 Other abnormalities of gait and mobility: Secondary | ICD-10-CM | POA: Diagnosis not present

## 2024-02-03 DIAGNOSIS — R269 Unspecified abnormalities of gait and mobility: Secondary | ICD-10-CM

## 2024-02-03 DIAGNOSIS — R278 Other lack of coordination: Secondary | ICD-10-CM | POA: Diagnosis not present

## 2024-02-03 DIAGNOSIS — S93401S Sprain of unspecified ligament of right ankle, sequela: Secondary | ICD-10-CM | POA: Diagnosis not present

## 2024-02-03 DIAGNOSIS — M25571 Pain in right ankle and joints of right foot: Secondary | ICD-10-CM | POA: Diagnosis not present

## 2024-02-03 DIAGNOSIS — M542 Cervicalgia: Secondary | ICD-10-CM | POA: Diagnosis not present

## 2024-02-03 DIAGNOSIS — R262 Difficulty in walking, not elsewhere classified: Secondary | ICD-10-CM

## 2024-02-03 DIAGNOSIS — R296 Repeated falls: Secondary | ICD-10-CM | POA: Diagnosis not present

## 2024-02-03 NOTE — Therapy (Signed)
 OUTPATIENT PHYSICAL THERAPY LOWER EXTREMITY TREATMENT   Patient Name: Denise Spencer MRN: [id number] DOB:10-03-1960, 63 y.o., female Today's Date: 02/03/2024  END OF SESSION:  PT End of Session - 02/03/24 1021     Visit Number 3    Number of Visits 24    Date for PT Re-Evaluation 04/19/24    Progress Note Due on Visit 10    PT Start Time 1017    PT Stop Time 1058    PT Time Calculation (min) 41 min    Equipment Utilized During Treatment Gait belt    Activity Tolerance Patient tolerated treatment well;No increased pain    Behavior During Therapy WFL for tasks assessed/performed          Past Medical History:  Diagnosis Date   Allergy 1970's   Anxiety    Arthritis    knees, lower back   Asthma    Bipolar disorder (HCC)    Bulging lumbar disc    L5-S1   COPD (chronic obstructive pulmonary disease) (HCC)    MILD   Depression    Dyspnea    Fibromyalgia    GERD (gastroesophageal reflux disease)    PONV (postoperative nausea and vomiting)    with hysterectomy only   Sleep apnea    pt no longer uses CPAP since losing weight and states that she currently has no issues   Wears dentures    full upper and lower   Past Surgical History:  Procedure Laterality Date   ABDOMINAL HYSTERECTOMY  2002   ABDOMINAL HYSTERECTOMY W/ PARTIAL VAGINACTOMY     ANTERIOR CERVICAL DECOMP/DISCECTOMY FUSION N/A 12/15/2022   Procedure: ANTERIOR CERVICAL DECOMPRESSION AND FUSION CERVICAL 5- CERVICAL 6 WITH INSTRUMENTATION AND ALLOGRAFT;  Surgeon: Beuford Anes, MD;  Location: [facility];  Service: Orthopedics;  Laterality: N/A;   CARDIAC CATHETERIZATION  2012   CESAREAN SECTION     COLONOSCOPY     COLONOSCOPY WITH PROPOFOL  N/A 11/01/2016   Procedure: COLONOSCOPY WITH PROPOFOL ;  Surgeon: Jinny Carmine, MD;  Location: [facility];  Service: Endoscopy;  Laterality: N/A;   COLONOSCOPY WITH PROPOFOL  N/A 05/22/2021   Procedure: COLONOSCOPY WITH BIOPSY;  Surgeon: Jinny Carmine, MD;  Location:  [facility];  Service: Endoscopy;  Laterality: N/A;   ESOPHAGOGASTRODUODENOSCOPY (EGD) WITH PROPOFOL  N/A 11/01/2016   Procedure: ESOPHAGOGASTRODUODENOSCOPY (EGD) WITH PROPOFOL ;  Surgeon: Jinny Carmine, MD;  Location: [facility];  Service: Endoscopy;  Laterality: N/A;   HIP ARTHROPLASTY Left 04/2020   JOINT REPLACEMENT  2021   POLYPECTOMY  11/01/2016   Procedure: POLYPECTOMY;  Surgeon: Jinny Carmine, MD;  Location: [facility];  Service: Endoscopy;;   POLYPECTOMY N/A 05/22/2021   Procedure: POLYPECTOMY;  Surgeon: Jinny Carmine, MD;  Location: [facility];  Service: Endoscopy;  Laterality: N/A;   SEPTOPLASTY Left 12/27/2016   Procedure: SEPTOPLASTY,ENDOSCOPIC TRIMMING MIDDLE TURBINATE;  Surgeon: Edda Mt, MD;  Location: [facility];  Service: ENT;  Laterality: Left;   SPINE SURGERY  2024   TUBAL LIGATION     WRIST GANGLION EXCISION     Patient Active Problem List   Diagnosis Date Noted   Stage 1 mild COPD by GOLD classification (HCC) 12/29/2023   Ulcer of right foot with fat layer exposed (HCC) 12/29/2023   Other hyperlipidemia 12/29/2023   Morbid obesity (HCC) 12/29/2023   Sleep apnea in adult 12/29/2023   Prediabetes 12/29/2023   Aspiration pneumonia of left lung (HCC)    Acute respiratory failure with hypoxia (HCC)    Polyp of  ascending colon    Bursitis of left hip 03/25/2021   Low back pain 10/22/2020   S/P hip replacement, left 04/25/2020   Primary osteoarthritis of left hip 01/11/2020   Peroneal tendinitis 08/30/2017   Degeneration of lumbar intervertebral disc 08/30/2017   Chondromalacia patellae 08/30/2017   Knee pain 08/30/2017   Ankle pain 08/30/2017   Anxiety 02/14/2017   History of colonic polyps    Benign neoplasm of transverse colon    Benign neoplasm of descending colon    Benign neoplasm of ascending colon    Problems with swallowing and mastication    Elevated liver enzymes 08/17/2016   Spasm 02/13/2014   Imbalance 02/13/2014    Fatigue 02/13/2014   Family history of MS (multiple sclerosis) 02/13/2014   Class 1 obesity 02/18/2013   Bipolar II disorder (HCC) 10/10/2012   Acid reflux 10/25/2011   CFIDS (chronic fatigue and immune dysfunction syndrome) (HCC) 10/25/2011   Abdominal pain 06/25/2011    PCP: Dr. Jolynn Spencer  REFERRING PROVIDER: Dr. Juliene Medicine   REFERRING DIAG: [id number] (ICD-10-CM) - Moderate right ankle sprain, sequela   THERAPY DIAG:  Abnormality of gait and mobility  Difficulty in walking, not elsewhere classified  Muscle weakness (generalized)  Other lack of coordination  Unsteadiness on feet  Pain in right ankle and joints of right foot  Rationale for Evaluation and Treatment: Rehabilitation  ONSET DATE: Since childhood  SUBJECTIVE:   SUBJECTIVE STATEMENT:  TODAY: My foot was sore while I do the activities but improves immediately with rest.    From EVAL: I have been having Right ankle for many years- 2 fusion surgeries for R foot (last being 10/14/2023). Patient denies falling but report foot does not lift when walking and trips her up.   PERTINENT HISTORY: 10/14/2023-FUSION OF RIGHT GREAT TOE JOINT WITH BONE GRAFT FROM HEEL, SHORTENING OF 2,3 METATARSALS. 1st surgery was June 2023  Known Arthritis of 1st Metatarsophalangeal (MTP) joint of right foot, Metatarsal deformity of Right foot See above section for PMH.    PAIN:  Are you having pain? Yes: NPRS scale: 4/10 Right ankle; worst = 7/10  Pain location: R ankle joint - Global Pain description: ache Aggravating factors: Squat; attempting to stand on toes, excessive walking Relieving factors: Ice, Tylenol , Rest  PRECAUTIONS: Fall, Patient reports not supposed to go up onto tiptoes  RED FLAGS: None   WEIGHT BEARING RESTRICTIONS: No  FALLS:  Has patient fallen in last 6 months? No  LIVING ENVIRONMENT: Lives with: lives with their family-Husband and Son Lives in: House/apartment Stairs: Yes: External: 2 STE  in front; 1 STE in back- steps; iron Lattice Has following equipment at home: Single point cane, Walker - 2 wheeled, and bed side commode  OCCUPATION: Disabled- Cats (5); foster for animal  PLOF: Independent  PATIENT GOALS: To be pain free or as close as possible; improve my   NEXT MD VISIT: October 2025  OBJECTIVE:  Note: Objective measures were completed at Evaluation unless otherwise noted.  DIAGNOSTIC FINDINGS: Multiple view plain film radiographs: Correction maintained has complete consolidation of the fusion site and good consolidation of metatarsal osteotomies   PATIENT SURVEYS:  LEFS  Extreme difficulty/unable (0), Quite a bit of difficulty (1), Moderate difficulty (2), Little difficulty (3), No difficulty (4) Survey date:    Any of your usual work, housework or school activities   2. Usual hobbies, recreational or sporting activities   3. Getting into/out of the bath   4. Walking between rooms  5. Putting on socks/shoes   6. Squatting    7. Lifting an object, like a bag of groceries from the floor   8. Performing light activities around your home   9. Performing heavy activities around your home   10. Getting into/out of a car   11. Walking 2 blocks   12. Walking 1 mile   13. Going up/down 10 stairs (1 flight)   14. Standing for 1 hour   15.  sitting for 1 hour   16. Running on even ground   17. Running on uneven ground   18. Making sharp turns while running fast   19. Hopping    20. Rolling over in bed   Score total:  To be assessed next visit     COGNITION: Overall cognitive status: Within functional limits for tasks assessed     SENSATION: WFL  EDEMA:  None observed   POSTURE: rounded shoulders, forward head, and no significant LE issues  PALPATION: Gentle tenderness around anterior portion of ankle joint complex- most tenderness along ATF ligament   LOWER EXTREMITY ROM:  Active ROM Right eval Left eval  Hip flexion    Hip extension     Hip abduction    Hip adduction    Hip internal rotation    Hip external rotation    Knee flexion    Knee extension    Ankle dorsiflexion 5   Ankle plantarflexion 32   Ankle inversion    Ankle eversion     (Blank rows = not tested)  LOWER EXTREMITY MMT:  MMT Right eval Left eval  Hip flexion 5 4  Hip extension    Hip abduction 4+ 4  Hip adduction    Hip internal rotation 4+ 4+  Hip external rotation    Knee flexion    Knee extension    Ankle dorsiflexion 2+ 4  Ankle plantarflexion NT NT  Ankle inversion 3+ 4  Ankle eversion 3+ 4   (Blank rows = not tested)  LOWER EXTREMITY SPECIAL TESTS:  Anterior Drawer (ATF, 10-15 plantarflexion with anterior translation): Negative Talar Tilt (CFL, inversion): Negative Eversion Stress Test (Deltoid, eversion): Will test next session  FUNCTIONAL TESTS:  5 times sit to stand: 22.52 sec without UE Support Timed up and go (TUG): 13.41; 13.2 sec without UE   6 Min Walk Test:  Instructed patient to ambulate as quickly and as safely as possible for 6 minutes using LRAD. Patient was allowed to take standing rest breaks without stopping the test, but if the patient required a sitting rest break the clock would be stopped and the test would be over.  Results: 1045 feet (319 meters, Avg speed 0.89 m/s) using no AD with Supervision. Results indicate that the patient has reduced endurance with ambulation compared to age matched norms.  Age Matched Norms: 12-69 yo M: 89 F: 36, 69-79 yo M: 2 F: 471, 59-89 yo M: 417 F: 392 MDC: 58.21 meters (190.98 feet) or 50 meters (ANPTA Core Set of Outcome Measures for Adults with Neurologic Conditions, 2018)  10 meter walk test: .88 m/s Berg Balance Scale: will assess next visit  GAIT: Distance walked: 1045 Assistive device utilized: None Level of assistance: SBA Comments: mild decreased right heel to toe and decreased foot clearance with fatigue.    Extreme difficulty/unable (0), Quite a bit of  difficulty (1), Moderate difficulty (2), Little difficulty (3), No difficulty (4) Survey date:  01/31/2024  Any of your usual work, housework or school activities  4  2. Usual hobbies, recreational or sporting activities 2  3. Getting into/out of the bath 4  4. Walking between rooms 3  5. Putting on socks/shoes 4  6. Squatting  0  7. Lifting an object, like a bag of groceries from the floor 2  8. Performing light activities around your home 4  9. Performing heavy activities around your home 2  10. Getting into/out of a car 2  11. Walking 2 blocks 0  12. Walking 1 mile 0  13. Going up/down 10 stairs (1 flight) 0  14. Standing for 1 hour 0  15.  sitting for 1 hour 4  16. Running on even ground 0  17. Running on uneven ground 0  18. Making sharp turns while running fast 0  19. Hopping  0  20. Rolling over in bed 1  Score total:  28/80                                                                                                                                  TREATMENT DATE: 01/31/2024  THEREX-  R LE -Seated ankle Inversion/Eversion-2 x 10  -Seated ankle DF/PF 2 x 10  - Seated ankle circles CW/CCW -Long sit - ankle DF YTB 2 x 10  -Long sit - ankle PF YTB 2 x 10  -Long sit - ankle IV YTB 2 x 10  -Long sit - ankle EV YTB 2 x 10  -Seated heel raises 2 x 10 reps -Seated Toe raises 2 x10 -Standing calf raises 2 x 10  Self care/home management:  Added and reviewed handout from previous and today's activities and issued new handout with updates. Issued yellow, Red, and green theraband today.    PATIENT EDUCATION:  Education details: Purpose of PT and plan of care Person educated: Patient Education method: Explanation Education comprehension: verbalized understanding  HOME EXERCISE PROGRAM: Access Code: [email or web address] URL: [email or web address] Date: 02/03/2024 Prepared by: Reyes London  Exercises - Supine Ankle Inversion Eversion AROM  - 1 x daily - 3 sets  - 10 reps - Supine Ankle Pumps  - 1 x daily - 3 sets - 10 reps - Supine Ankle Circles  - 1 x daily - 3 sets - 10 reps - Seated Toe Towel Scrunches  - 3 x weekly - 3 sets - 10 reps - Toe Spreading  - 3 x weekly - 3 sets - 10 reps - Seated Arch Lifts  - 3 x weekly - 3 sets - 10 reps - Seated Ankle Eversion with Resistance  - 3 x weekly - 3 sets - 10 reps - Seated Ankle Inversion with Resistance  - 3 x weekly - 3 sets - 10 reps - Seated Ankle Dorsiflexion with Resistance  - 3 x weekly - 3 sets - 10 reps - Seated Heel Raise  - 1 x daily - 3 sets - 10 reps - Seated Heel Toe Raises  - 1  x daily - 3 sets - 10 reps      Access Code: [email or web address] URL: [email or web address] Date: 01/31/2024 Prepared by: Reyes London  Exercises - Supine Ankle Inversion Eversion AROM  - 1 x daily - 3 sets - 10 reps - Supine Ankle Pumps  - 1 x daily - 3 sets - 10 reps - Supine Ankle Circles  - 1 x daily - 3 sets - 10 reps - Seated Toe Towel Scrunches  - 3 x weekly - 3 sets - 10 reps - Toe Spreading  - 3 x weekly - 3 sets - 10 reps - Seated Arch Lifts  - 3 x weekly - 3 sets - 10 reps ASSESSMENT:  CLINICAL IMPRESSION: Patient is a 63 y.o. female who was seen today for physical therapy treatment for R ankle sprain. Patient performed very well overall- able to perform all active exercises without report of pain- so able to progress to more resistive strengthening with light theraband without difficulty. She was able to follow all commands/instructions well and able to add to HEP today.  She will benefit from skilled PT services to improve her ankle ROM, pain relief, ankle stability/balance and improved functional mobility to enable her to return to previous level of function and decrease her risk of falling.   OBJECTIVE IMPAIRMENTS: Abnormal gait, decreased balance, decreased coordination, decreased endurance, decreased mobility, difficulty walking, decreased ROM, decreased strength, and pain.    ACTIVITY LIMITATIONS: lifting, bending, standing, squatting, stairs, and transfers  PARTICIPATION LIMITATIONS: laundry, shopping, community activity, and yard work  PERSONAL FACTORS: Time since onset of injury/illness/exacerbation and 1-2 comorbidities: multiple foot sx, arthritis are also affecting patient's functional outcome.   REHAB POTENTIAL: Good  CLINICAL DECISION MAKING: Stable/uncomplicated  EVALUATION COMPLEXITY: Low   GOALS: Goals reviewed with patient? Yes  SHORT TERM GOALS: Target date: 03/08/2024 Pt will be independent with HEP in order to decrease ankle pain and increase strength in order to improve pain-free function at home. Baseline: EVAL= No formal HEP in place Goal status: INITIAL   LONG TERM GOALS: Target date: 04/19/2024  Pt will increase LEFS by at least 9 points in order to demonstrate significant improvement in lower extremity function.  Baseline: EVAL - TO be administered next visit Goal status: INITIAL  2.  Pt will improve BERG by at least 3 points in order to demonstrate clinically significant improvement in balance.  Baseline: EVAL= To be assessed next visit Goal status: INITIAL  3.  Pt will decrease worst pain as reported on NPRS by at least 3 points in order to demonstrate clinically significant reduction in ankle/foot pain.  Baseline: EVAL= 7/10 right ankle at worst Goal status: INITIAL  4.  Pt will increase strength of  by at least 1/2 MMT grade in order to demonstrate improvement in strength and function  Baseline: Right ankle DF= 2+/5 Goal status: INITIAL  5.  Patient will demonstrated improved Right ankle DF ROM for improved heel to toe sequencing and less risk of foot drop.  Baseline: EVAL= R ankle DF= 5 deg Goal status: INITIAL  6.  Patient will improve 6 min walk test > 1250 feet without an AD for improved functional walking in the community.  Baseline: EVAL= 1045 feet Goal status: INITIAL   PLAN:  PT FREQUENCY:  1-2x/week  PT DURATION: 12 weeks  PLANNED INTERVENTIONS: [id number]- PT Re-evaluation, 97750- Physical Performance Testing, 97110-Therapeutic exercises, 97530- Therapeutic activity, [id number]- Neuromuscular re-education, 97535- Self Care, [id number]- Manual therapy, [id number]- Gait training, [id number]- Orthotic Initial, [phone]- Orthotic/Prosthetic  subsequent, [id number]- Canalith repositioning, 79439 (1-2 muscles), 20561 (3+ muscles)- Dry Needling, Patient/Family education, Balance training, Stair training, Taping, Joint mobilization, Joint manipulation, Compression bandaging, Vestibular training, DME instructions, Cryotherapy, and Moist heat  PLAN FOR NEXT SESSION:  PROGRESS Ankle ROM/Strengthening and HEP Balance training and LE strengthening.    Reyes LOISE London, PT 02/03/2024, 11:02 AM

## 2024-02-07 ENCOUNTER — Ambulatory Visit

## 2024-02-09 ENCOUNTER — Ambulatory Visit: Admitting: Physical Therapy

## 2024-02-14 ENCOUNTER — Ambulatory Visit: Admitting: Physical Therapy

## 2024-02-14 DIAGNOSIS — M797 Fibromyalgia: Secondary | ICD-10-CM | POA: Diagnosis not present

## 2024-02-14 DIAGNOSIS — G894 Chronic pain syndrome: Secondary | ICD-10-CM | POA: Diagnosis not present

## 2024-02-17 ENCOUNTER — Ambulatory Visit

## 2024-02-22 ENCOUNTER — Ambulatory Visit

## 2024-02-24 ENCOUNTER — Ambulatory Visit

## 2024-02-24 DIAGNOSIS — M47816 Spondylosis without myelopathy or radiculopathy, lumbar region: Secondary | ICD-10-CM | POA: Diagnosis not present

## 2024-02-26 ENCOUNTER — Encounter: Payer: Self-pay | Admitting: Physician Assistant

## 2024-02-27 DIAGNOSIS — G4733 Obstructive sleep apnea (adult) (pediatric): Secondary | ICD-10-CM | POA: Diagnosis not present

## 2024-02-28 ENCOUNTER — Encounter

## 2024-02-29 NOTE — Telephone Encounter (Signed)
 Noted

## 2024-03-01 DIAGNOSIS — E7849 Other hyperlipidemia: Secondary | ICD-10-CM | POA: Diagnosis not present

## 2024-03-01 DIAGNOSIS — G4730 Sleep apnea, unspecified: Secondary | ICD-10-CM | POA: Diagnosis not present

## 2024-03-01 DIAGNOSIS — R7303 Prediabetes: Secondary | ICD-10-CM | POA: Diagnosis not present

## 2024-03-02 LAB — LIPID PANEL
Chol/HDL Ratio: 3.1 ratio (ref 0.0–4.4)
Cholesterol, Total: 203 mg/dL — ABNORMAL HIGH (ref 100–199)
HDL: 65 mg/dL (ref >39)
LDL Chol Calc (NIH): 116 mg/dL — ABNORMAL HIGH (ref 0–99)
Triglycerides: 126 mg/dL (ref 0–149)
VLDL Cholesterol Cal: 22 mg/dL (ref 5–40)

## 2024-03-02 LAB — CBC WITH DIFFERENTIAL/PLATELET
Basophils Absolute: 0.1 x10E3/uL (ref 0.0–0.2)
Basos: 1 %
EOS (ABSOLUTE): 0.2 x10E3/uL (ref 0.0–0.4)
Eos: 1 %
Hematocrit: 45.2 % (ref 34.0–46.6)
Hemoglobin: 14.6 g/dL (ref 11.1–15.9)
Immature Grans (Abs): 0 x10E3/uL (ref 0.0–0.1)
Immature Granulocytes: 0 %
Lymphocytes Absolute: 4.4 x10E3/uL — ABNORMAL HIGH (ref 0.7–3.1)
Lymphs: 33 %
MCH: 29.2 pg (ref 26.6–33.0)
MCHC: 32.3 g/dL (ref 31.5–35.7)
MCV: 90 fL (ref 79–97)
Monocytes Absolute: 0.6 x10E3/uL (ref 0.1–0.9)
Monocytes: 4 %
Neutrophils Absolute: 7.9 x10E3/uL — ABNORMAL HIGH (ref 1.4–7.0)
Neutrophils: 61 %
Platelets: 301 x10E3/uL (ref 150–450)
RBC: 5 x10E6/uL (ref 3.77–5.28)
RDW: 13 % (ref 11.7–15.4)
WBC: 13.1 x10E3/uL — ABNORMAL HIGH (ref 3.4–10.8)

## 2024-03-02 LAB — COMPREHENSIVE METABOLIC PANEL WITH GFR
ALT: 14 IU/L (ref 0–32)
AST: 13 IU/L (ref 0–40)
Albumin: 4.3 g/dL (ref 3.8–4.9)
Alkaline Phosphatase: 119 IU/L (ref 44–121)
BUN/Creatinine Ratio: 8 — ABNORMAL LOW (ref 12–28)
BUN: 8 mg/dL (ref 8–27)
Bilirubin Total: 0.3 mg/dL (ref 0.0–1.2)
CO2: 25 mmol/L (ref 20–29)
Calcium: 9.8 mg/dL (ref 8.7–10.3)
Chloride: 101 mmol/L (ref 96–106)
Creatinine, Ser: 1 mg/dL (ref 0.57–1.00)
Globulin, Total: 2.6 g/dL (ref 1.5–4.5)
Glucose: 97 mg/dL (ref 70–99)
Potassium: 4.7 mmol/L (ref 3.5–5.2)
Sodium: 141 mmol/L (ref 134–144)
Total Protein: 6.9 g/dL (ref 6.0–8.5)
eGFR: 64 mL/min/1.73 (ref >59)

## 2024-03-02 LAB — HEMOGLOBIN A1C
Est. average glucose Bld gHb Est-mCnc: 120 mg/dL
Hgb A1c MFr Bld: 5.8 % — ABNORMAL HIGH (ref 4.8–5.6)

## 2024-03-02 LAB — TSH: TSH: 5.15 u[IU]/mL — ABNORMAL HIGH (ref 0.450–4.500)

## 2024-03-02 LAB — T4, FREE: Free T4: 0.95 ng/dL (ref 0.82–1.77)

## 2024-03-03 ENCOUNTER — Ambulatory Visit
Admission: RE | Admit: 2024-03-03 | Discharge: 2024-03-03 | Disposition: A | Discharge status: Home / Self Care | Source: Ambulatory Visit

## 2024-03-03 VITALS — BP 123/87 | HR 91 | Temp 98.0°F | Resp 18

## 2024-03-03 DIAGNOSIS — R109 Unspecified abdominal pain: Secondary | ICD-10-CM | POA: Insufficient documentation

## 2024-03-03 DIAGNOSIS — R8290 Unspecified abnormal findings in urine: Secondary | ICD-10-CM | POA: Insufficient documentation

## 2024-03-03 DIAGNOSIS — R3915 Urgency of urination: Secondary | ICD-10-CM | POA: Diagnosis not present

## 2024-03-03 LAB — POCT URINE DIPSTICK
Bilirubin, UA: NEGATIVE
Blood, UA: NEGATIVE
Glucose, UA: NEGATIVE mg/dL
Ketones, POC UA: NEGATIVE mg/dL
Nitrite, UA: NEGATIVE
POC PROTEIN,UA: NEGATIVE
Spec Grav, UA: 1.01 (ref 1.010–1.025)
Urobilinogen, UA: 0.2 U/dL
pH, UA: 6.5 (ref 5.0–8.0)

## 2024-03-03 MED ORDER — NITROFURANTOIN MONOHYD MACRO 100 MG PO CAPS: 100.0000 mg | ORAL_CAPSULE | Freq: Two times a day (BID) | ORAL | 0 refills | Status: DC

## 2024-03-03 NOTE — ED Provider Notes (Signed)
 CAY RALPH PELT    CSN: [id number] Arrival date & time: 03/03/24  1220      History   Chief Complaint Chief Complaint  Patient presents with   Abdominal Pain    Feels like a Kidney infection - Entered by patient    HPI Denise Spencer is a 63 y.o. female.   Patient admitted for evaluation of left-sided flank pain present for 2 months.  Has been constant.  Thought to be related to chronic back pain but after injection and procedure 1 week ago symptoms became more prominent when back pain resolved.  Over the last 3 days has begun to worsen radiating towards the left groin and began to experience urinary urgency.  Denies dysuria, hematuria, abdominal pain or vaginal symptoms.  Has not attempted treatment for  Past Medical History:  Diagnosis Date   Allergy 1970's   Anxiety    Arthritis    knees, lower back   Asthma    Bipolar disorder (HCC)    Bulging lumbar disc    L5-S1   COPD (chronic obstructive pulmonary disease) (HCC)    MILD   Depression    Dyspnea    Fibromyalgia    GERD (gastroesophageal reflux disease)    PONV (postoperative nausea and vomiting)    with hysterectomy only   Sleep apnea    pt no longer uses CPAP since losing weight and states that she currently has no issues   Wears dentures    full upper and lower    Patient Active Problem List   Diagnosis Date Noted   Stage 1 mild COPD by GOLD classification (HCC) 12/29/2023   Ulcer of right foot with fat layer exposed (HCC) 12/29/2023   Other hyperlipidemia 12/29/2023   Morbid obesity (HCC) 12/29/2023   Sleep apnea in adult 12/29/2023   Prediabetes 12/29/2023   Aspiration pneumonia of left lung (HCC)    Acute respiratory failure with hypoxia (HCC)    Polyp of ascending colon    Bursitis of left hip 03/25/2021   Low back pain 10/22/2020   S/P hip replacement, left 04/25/2020   Primary osteoarthritis of left hip 01/11/2020   Peroneal tendinitis 08/30/2017   Degeneration of lumbar  intervertebral disc 08/30/2017   Chondromalacia patellae 08/30/2017   Knee pain 08/30/2017   Ankle pain 08/30/2017   Anxiety 02/14/2017   History of colonic polyps    Benign neoplasm of transverse colon    Benign neoplasm of descending colon    Benign neoplasm of ascending colon    Problems with swallowing and mastication    Elevated liver enzymes 08/17/2016   Spasm 02/13/2014   Imbalance 02/13/2014   Fatigue 02/13/2014   Family history of MS (multiple sclerosis) 02/13/2014   Class 1 obesity 02/18/2013   Bipolar II disorder (HCC) 10/10/2012   Acid reflux 10/25/2011   CFIDS (chronic fatigue and immune dysfunction syndrome) (HCC) 10/25/2011   Abdominal pain 06/25/2011    Past Surgical History:  Procedure Laterality Date   ABDOMINAL HYSTERECTOMY  2002   ABDOMINAL HYSTERECTOMY W/ PARTIAL VAGINACTOMY     ANTERIOR CERVICAL DECOMP/DISCECTOMY FUSION N/A 12/15/2022   Procedure: ANTERIOR CERVICAL DECOMPRESSION AND FUSION CERVICAL 5- CERVICAL 6 WITH INSTRUMENTATION AND ALLOGRAFT;  Surgeon: Beuford Anes, MD;  Location: [facility];  Service: Orthopedics;  Laterality: N/A;   CARDIAC CATHETERIZATION  2012   CESAREAN SECTION     COLONOSCOPY     COLONOSCOPY WITH PROPOFOL  N/A 11/01/2016   Procedure: COLONOSCOPY WITH PROPOFOL ;  Surgeon: Jinny Carmine,  MD;  Location: [facility];  Service: Endoscopy;  Laterality: N/A;   COLONOSCOPY WITH PROPOFOL  N/A 05/22/2021   Procedure: COLONOSCOPY WITH BIOPSY;  Surgeon: Jinny Carmine, MD;  Location: [facility];  Service: Endoscopy;  Laterality: N/A;   ESOPHAGOGASTRODUODENOSCOPY (EGD) WITH PROPOFOL  N/A 11/01/2016   Procedure: ESOPHAGOGASTRODUODENOSCOPY (EGD) WITH PROPOFOL ;  Surgeon: Jinny Carmine, MD;  Location: [facility];  Service: Endoscopy;  Laterality: N/A;   HIP ARTHROPLASTY Left 04/2020   JOINT REPLACEMENT  2021   POLYPECTOMY  11/01/2016   Procedure: POLYPECTOMY;  Surgeon: Jinny Carmine, MD;  Location: [facility];  Service:  Endoscopy;;   POLYPECTOMY N/A 05/22/2021   Procedure: POLYPECTOMY;  Surgeon: Jinny Carmine, MD;  Location: [facility];  Service: Endoscopy;  Laterality: N/A;   SEPTOPLASTY Left 12/27/2016   Procedure: SEPTOPLASTY,ENDOSCOPIC TRIMMING MIDDLE TURBINATE;  Surgeon: Edda Mt, MD;  Location: [facility];  Service: ENT;  Laterality: Left;   SPINE SURGERY  2024   TUBAL LIGATION     WRIST GANGLION EXCISION      OB History   No obstetric history on file.      Home Medications    Prior to Admission medications   Medication Sig Start Date End Date Taking? Authorizing Provider  JOURNAVX 50 MG TABS Take 1 tablet by mouth daily as needed. 02/02/24  Yes [provider]  nitrofurantoin , macrocrystal-monohydrate, (MACROBID ) 100 MG capsule Take 1 capsule (100 mg total) by mouth 2 (two) times daily. 03/03/24  Yes Azie Mcconahy, Shelba SAUNDERS, NP  albuterol  (VENTOLIN  HFA) 108 (90 Base) MCG/ACT inhaler Inhale 2 puffs into the lungs every 6 (six) hours as needed for wheezing or shortness of breath. 01/11/24   Gasper Nancyann BRAVO, MD  atorvastatin  (LIPITOR) 20 MG tablet Take 1 tab daily by mouth 12/07/23   Ostwalt, Janna, PA-C  Cholecalciferol (VITAMIN D3 MAXIMUM STRENGTH) 125 MCG (5000 UT) capsule Take 5,000 Units by mouth daily.    [provider]  clonazePAM  (KLONOPIN ) 1 MG tablet Take 1 tablet (1 mg total) by mouth 4 (four) times daily as needed. Patient taking differently: Take 1-2 mg by mouth See admin instructions. Take 2 mg at night, may take a 1 mg dose twice daily as needed for anxiety 10/19/22   [name], Norleen DASEN, MD  gabapentin  (NEURONTIN ) 300 MG capsule Take 1 capsule (300 mg total) by mouth 3 (three) times daily for 7 days. Patient taking differently: Take 1 capsule (300 mg total) by mouth 3 (three) times daily for 7 days. 10/14/23 01/25/24  Silva Juliene SAUNDERS, DPM  metaxalone (SKELAXIN) 800 MG tablet Take 800 mg by mouth 3 (three) times daily as needed. 08/27/23   [provider]   nortriptyline  (PAMELOR ) 75 MG capsule Take 75 mg by mouth at bedtime. 11/30/23   [provider]  pantoprazole  (PROTONIX ) 40 MG tablet Take 1 tablet (40 mg total) by mouth 2 (two) times daily. As needed 12/26/23   Ostwalt, Janna, PA-C  QUEtiapine  (SEROQUEL  XR) 50 MG TB24 24 hr tablet Take 50 mg by mouth at bedtime. 08/11/23   [provider]  sertraline  (ZOLOFT ) 100 MG tablet Take 1.5 tablets (150 mg total) by mouth daily. 10/19/22   [name], Norleen DASEN, MD  traMADol  (ULTRAM ) 50 MG tablet Take 50 mg by mouth every 6 (six) hours as needed for moderate pain.    [provider]    Family History Family History  Problem Relation Age of Onset   Breast cancer Mother 65   Bipolar disorder Mother  Depression Mother    Anxiety disorder Mother    Arthritis Mother    Cancer Mother    Obesity Mother    Varicose Veins Mother    Heart attack Father    Thyroid  cancer Father    Hypertension Father    Arthritis Father    Cancer Father    Heart disease Father    Depression Sister    Multiple sclerosis Sister    Asthma Sister    Hypertension Sister    Learning disabilities Sister    Miscarriages / Stillbirths Sister    Anxiety disorder Sister    Bipolar disorder Sister    Hypertension Sister    Bipolar disorder Sister    Alcohol abuse Sister    Cancer Sister    Early death Sister    Miscarriages / Stillbirths Sister    Obesity Sister    Lymphoma Sister    Anxiety disorder Sister    Bipolar disorder Sister    Multiple sclerosis Other     Social History Social History   Tobacco Use   Smoking status: Former    Current packs/day: 0.00    Average packs/day: 0.5 packs/day for 45.4 years (22.7 ttl pk-yrs)    Types: Cigarettes    Start date: 05/04/1977    Quit date: 09/13/2022    Years since quitting: 1.4   Smokeless tobacco: Never   Tobacco comments:     Quit in 2018 but has since restarted after losing her daughter 04/2020  Vaping Use   Vaping status: Never  Used  Substance Use Topics   Alcohol use: No   Drug use: No     Allergies   Advair diskus [fluticasone -salmeterol], Aspirin, Breo ellipta  [fluticasone  furoate-vilanterol], Nsaids, Tape, Amoxicillin, Doxycycline, Erythromycin, Penicillins, and Sulfa antibiotics   Review of Systems Review of Systems   Physical Exam Triage Vital Signs ED Triage Vitals  Encounter Vitals Group     BP 03/03/24 1237 123/87     Girls Systolic BP Percentile --      Girls Diastolic BP Percentile --      Boys Systolic BP Percentile --      Boys Diastolic BP Percentile --      Pulse Rate 03/03/24 1237 91     Resp 03/03/24 1237 18     Temp 03/03/24 1237 98 F (36.7 C)     Temp src --      SpO2 03/03/24 1237 98 %     Weight --      Height --      Head Circumference --      Peak Flow --      Pain Score 03/03/24 1235 6     Pain Loc --      Pain Education --      Exclude from Growth Chart --    No data found.  Updated Vital Signs BP 123/87 (BP Location: Left Arm)   Pulse 91   Temp 98 F (36.7 C)   Resp 18   SpO2 98%   Visual Acuity Right Eye Distance:   Left Eye Distance:   Bilateral Distance:    Right Eye Near:   Left Eye Near:    Bilateral Near:     Physical Exam Constitutional:      Appearance: Normal appearance.  Eyes:     Extraocular Movements: Extraocular movements intact.  Pulmonary:     Effort: Pulmonary effort is normal.  Abdominal:     General: Abdomen is flat. Bowel sounds are  normal.     Palpations: Abdomen is soft.     Tenderness: There is abdominal tenderness in the suprapubic area and left lower quadrant. There is left CVA tenderness.  Neurological:     Mental Status: She is alert and oriented to person, place, and time. Mental status is at baseline.      UC Treatments / Results  Labs (all labs ordered are listed, but only abnormal results are displayed) Labs Reviewed  POCT URINE DIPSTICK - Abnormal; Notable for the following components:      Result Value    Leukocytes, UA Trace (*)    All other components within normal limits  URINE CULTURE    EKG   Radiology No results found.  Procedures Procedures (including critical care time)  Medications Ordered in UC Medications - No data to display  Initial Impression / Assessment and Plan / UC Course  I have reviewed the triage vital signs and the nursing notes.  Pertinent labs & imaging results that were available during my care of the patient were reviewed by me and considered in my medical decision making (see chart for details).  Urinary urgency, left flank pain  Urinalysis showing leukocytes but negative for nitrates, sent for culture empirically placed on Macrobid  as she is symptomatic, due to timeline of left flank pain and no worsening symptoms from most likely etiology is a kidney stone however we will move forward with infectious treatment for today, patient has upcoming PCP appointment on Monday , discussed monitoring symptoms advised increase fluid intake and nonpharmacological measures, given strict ER precautions for symptoms worsening and severe Final Clinical Impressions(s) / UC Diagnoses   Final diagnoses:  Urinary urgency     Discharge Instructions      Your urinalysis shows Aashvi Rezabek blood cells but does not show bacteria , your urine will be sent to the lab to determine exactly which bacteria is present, if any changes need to be made to your medications you will be notified  Based on your symptoms and your examination as there is tenderness over your left kidney and your groin I do believe it is possible that you have a kidney stone which can typically only be visualized on the CT scan therefore keep your upcoming primary care appointment for further management  Begin use of Macrobid  twice daily for 5 days  You may use over-the-counter Azo to help minimize your symptoms until antibiotic removes bacteria, this medication will turn your urine orange  Increase your  fluid intake through use of water   As always practice good hygiene, wiping front to back and avoidance of scented vaginal products to prevent further irritation  If symptoms continue to persist after use of medication or recur please follow-up with urgent care or your primary doctor as needed    ED Prescriptions     Medication Sig Dispense Auth. Provider   nitrofurantoin , macrocrystal-monohydrate, (MACROBID ) 100 MG capsule Take 1 capsule (100 mg total) by mouth 2 (two) times daily. 10 capsule Denise Pommier R, NP      PDMP not reviewed this encounter.   Teresa Shelba SAUNDERS, [place] 03/03/24 [phone]

## 2024-03-03 NOTE — ED Triage Notes (Signed)
 Pt presents with left side abdominal x 8 weeks. She reports some urinary symptoms in the past 3 days. Pt has taken tylenol  for his symptoms.

## 2024-03-03 NOTE — Discharge Instructions (Addendum)
 Your urinalysis shows Kaitrin Seybold blood cells but does not show bacteria , your urine will be sent to the lab to determine exactly which bacteria is present, if any changes need to be made to your medications you will be notified  Based on your symptoms and your examination as there is tenderness over your left kidney and your groin I do believe it is possible that you have a kidney stone which can typically only be visualized on the CT scan therefore keep your upcoming primary care appointment for further management  Begin use of Macrobid  twice daily for 5 days  You may use over-the-counter Azo to help minimize your symptoms until antibiotic removes bacteria, this medication will turn your urine orange  Increase your fluid intake through use of water   As always practice good hygiene, wiping front to back and avoidance of scented vaginal products to prevent further irritation  If symptoms continue to persist after use of medication or recur please follow-up with urgent care or your primary doctor as needed

## 2024-03-04 LAB — URINE CULTURE: Culture: <10000 — AB

## 2024-03-05 ENCOUNTER — Ambulatory Visit (INDEPENDENT_AMBULATORY_CARE_PROVIDER_SITE_OTHER): Admitting: Physician Assistant

## 2024-03-05 ENCOUNTER — Ambulatory Visit: Payer: Self-pay | Admitting: Physician Assistant

## 2024-03-05 VITALS — BP 126/78 | HR 92 | Ht 67.0 in | Wt 207.0 lb

## 2024-03-05 DIAGNOSIS — F3181 Bipolar II disorder: Secondary | ICD-10-CM

## 2024-03-05 DIAGNOSIS — R109 Unspecified abdominal pain: Secondary | ICD-10-CM

## 2024-03-05 DIAGNOSIS — E7800 Pure hypercholesterolemia, unspecified: Secondary | ICD-10-CM | POA: Diagnosis not present

## 2024-03-05 DIAGNOSIS — E049 Nontoxic goiter, unspecified: Secondary | ICD-10-CM | POA: Diagnosis not present

## 2024-03-05 DIAGNOSIS — R7303 Prediabetes: Secondary | ICD-10-CM

## 2024-03-05 DIAGNOSIS — F419 Anxiety disorder, unspecified: Secondary | ICD-10-CM

## 2024-03-05 DIAGNOSIS — Z91199 Patient's noncompliance with other medical treatment and regimen due to unspecified reason: Secondary | ICD-10-CM | POA: Diagnosis not present

## 2024-03-05 DIAGNOSIS — G894 Chronic pain syndrome: Secondary | ICD-10-CM | POA: Diagnosis not present

## 2024-03-05 DIAGNOSIS — F339 Major depressive disorder, recurrent, unspecified: Secondary | ICD-10-CM

## 2024-03-05 NOTE — Progress Notes (Signed)
 Established patient visit  Patient: Denise Spencer   DOB: [birth year]   63 y.o. Female  MRN: [id number] Visit Date: 03/05/2024  Today's healthcare provider: Jolynn Spencer, PA-C   Chief Complaint  Patient presents with   Weight loss management    Patient wants to discuss management of her weith  Patient also states she had abnormal WBC on her labs last week and you were going to discuss with her today.  Patient also relates she was seen at UC this weekend due to some back/side pain and is being treated for UTI     Subjective     HPI     Weight loss management    Additional comments: Patient wants to discuss management of her weith  Patient also states she had abnormal WBC on her labs last week and you were going to discuss with her today.  Patient also relates she was seen at UC this weekend due to some back/side pain and is being treated for UTI        Last edited by Kathi Buel BIRCH, CMA on 03/05/2024  1:10 PM.       Discussed the use of AI scribe software for clinical note transcription with the patient, who gave verbal consent to proceed.  History of Present Illness Denise Spencer is a 63 year old female who presents with persistent lower back pain and weight management concerns.  She has experienced persistent lower back pain for almost eight weeks, initially thought to be due to a kidney stone. The pain is located in the lower back, radiating to the waistline and kidney area, constant, and rated six out of ten in severity. She has been taking Macrobid  for a suspected UTI for five days without pain relief. She underwent a procedure to burn nerves in her back almost two weeks ago.  She is focused on weight management, following a Mediterranean diet, and has lost weight from 218 pounds in March to 207 pounds currently. She walks 3,000 to 4,000 steps a day when feeling well.  Her family history includes thyroid  issues in her mother and two sisters, and her father  developed cancer in his eighties. She feels a constant lump in her throat and has a history of C5, C6 surgery. She experiences heat intolerance.  She has elevated cholesterol, A1c, and thyroid  levels. She experiences higher than usual anxiety, but her depression is managed with weekly therapy sessions. Her heart rate occasionally increases. No current urinary symptoms or blood in her urine. Her urine culture showed insignificant growth.       03/05/2024    1:13 PM 01/04/2024    3:07 PM 09/29/2023    2:01 PM  Depression screen PHQ 2/9  Decreased Interest 0 1 0  Down, Depressed, Hopeless 0 1 0  PHQ - 2 Score 0 2 0  Altered sleeping 0 1 2  Tired, decreased energy 3 1 2   Change in appetite 2 1 0  Feeling bad or failure about yourself  0 0   Trouble concentrating 0 1 0  Moving slowly or fidgety/restless 0 0 0  Suicidal thoughts 0 0 0  PHQ-9 Score 5 6 4   Difficult doing work/chores Somewhat difficult Not difficult at all Somewhat difficult      03/05/2024    1:13 PM 01/04/2024    3:08 PM 09/29/2023    2:02 PM 08/04/2023   10:23 AM  GAD 7 : Generalized Anxiety Score  Nervous, Anxious, on Edge 1 0 1 2  Control/stop worrying 1 0 0 0  Worry too much - different things 1 0 0 2  Trouble relaxing 2 0 0 3  Restless 0 0 0 0  Easily annoyed or irritable 1 0 0 3  Afraid - awful might happen 0 0 0 0  Total GAD 7 Score 6 0 1 10  Anxiety Difficulty Somewhat difficult Not difficult at all Somewhat difficult Not difficult at all    Medications: Outpatient Medications Prior to Visit  Medication Sig   albuterol  (VENTOLIN  HFA) 108 (90 Base) MCG/ACT inhaler Inhale 2 puffs into the lungs every 6 (six) hours as needed for wheezing or shortness of breath.   atorvastatin  (LIPITOR) 20 MG tablet Take 1 tab daily by mouth   Cholecalciferol (VITAMIN D3 MAXIMUM STRENGTH) 125 MCG (5000 UT) capsule Take 5,000 Units by mouth daily.   clonazePAM  (KLONOPIN ) 1 MG tablet Take 1 tablet (1 mg total) by mouth 4 (four)  times daily as needed.   cyclobenzaprine (FLEXERIL) 5 MG tablet Take 5 mg by mouth 3 (three) times daily as needed for muscle spasms.   gabapentin  (NEURONTIN ) 300 MG capsule Take 1 capsule (300 mg total) by mouth 3 (three) times daily for 7 days.   nitrofurantoin , macrocrystal-monohydrate, (MACROBID ) 100 MG capsule Take 1 capsule (100 mg total) by mouth 2 (two) times daily.   nortriptyline  (PAMELOR ) 75 MG capsule Take 75 mg by mouth at bedtime.   pantoprazole  (PROTONIX ) 40 MG tablet Take 1 tablet (40 mg total) by mouth 2 (two) times daily. As needed   QUEtiapine  (SEROQUEL  XR) 50 MG TB24 24 hr tablet Take 50 mg by mouth at bedtime.   sertraline  (ZOLOFT ) 100 MG tablet Take 1.5 tablets (150 mg total) by mouth daily.   JOURNAVX 50 MG TABS Take 1 tablet by mouth daily as needed.   metaxalone (SKELAXIN) 800 MG tablet Take 800 mg by mouth 3 (three) times daily as needed.   traMADol  (ULTRAM ) 50 MG tablet Take 50 mg by mouth every 6 (six) hours as needed for moderate pain.   No facility-administered medications prior to visit.    Review of Systems All negative Except see HPI       Objective    BP 126/78 (BP Location: Left Arm, Patient Position: Sitting, Cuff Size: Normal)   Pulse 92   Ht 5' 7 (1.702 m)   Wt 207 lb (93.9 kg)   SpO2 98%   BMI 32.42 kg/m     Physical Exam Vitals reviewed.  Constitutional:      General: She is not in acute distress.    Appearance: Normal appearance. She is well-developed. She is not diaphoretic.  HENT:     Head: Normocephalic and atraumatic.  Eyes:     General: No scleral icterus.    Conjunctiva/sclera: Conjunctivae normal.  Neck:     Thyroid : No thyromegaly.  Cardiovascular:     Rate and Rhythm: Normal rate and regular rhythm.     Pulses: Normal pulses.     Heart sounds: Normal heart sounds. No murmur heard. Pulmonary:     Effort: Pulmonary effort is normal. No respiratory distress.     Breath sounds: Normal breath sounds. No wheezing, rhonchi  or rales.  Musculoskeletal:     Cervical back: Neck supple.     Right lower leg: No edema.     Left lower leg: No edema.  Lymphadenopathy:     Cervical: No cervical adenopathy.  Skin:    General: Skin is warm and dry.  Findings: No rash.  Neurological:     Mental Status: She is alert and oriented to person, place, and time. Mental status is at baseline.  Psychiatric:        Mood and Affect: Mood normal.        Behavior: Behavior normal.      No results found for any visits on 03/05/24.      Assessment & Plan Left flank and lower back pain Persistent pain for two months, differential includes nephrolithiasis or musculoskeletal cause. Previous urinalysis showed insignificant growth and no blood. - Order x-ray of the left flank and lower back. - Consider CT scan if x-ray is inconclusive. - Encourage hydration and cranberry juice intake.  Morbid obesity due to excess calories Chronic Body mass index is 32.42 kg/m. Eligible for weight loss program. Insurance may require documented efforts through a nutritionist or weight loss program for 3-6 months before approving medication. No family history of pancreatic or gallbladder issues. Father had cancer in his 20s. - Refer to a nutritionist for weight management program. - Ensure weight loss program is covered by insurance. - Continue Mediterranean diet and physical activity. - Document weight loss efforts for insurance purposes.  Prediabetes Chronic and stable A1c elevated in prediabetic range. Insulin resistance present. Weight loss and dietary changes recommended to prevent progression. - Refer to a nutritionist for dietary management. - Encourage weight loss to improve insulin sensitivity.  Elevated cholesterol  levels Cholesterol levels elevated. Dietary changes and weight loss recommended to manage cholesterol. - Encourage low cholesterol, low carb diet. - Continue weight loss efforts.  Thyroid  nodule/goiter,  unspecified Chronic Slightly elevated TSH indicating possible thyroid  dysfunction. Family history of thyroid  issues. No urgent intervention required. - Order thyroid  ultrasound. - Monitor thyroid  function.  Anxiety Chronic  Anxiety levels higher than usual. In therapy weekly and has a psychiatrist. Will follow-up with psychiatry  Depression/BipolarII Chronic  Depression well-managed with current therapy and psychiatric care. Follow-up with psychiatry  Flank pain (Primary) - DG Abd 1 View  Goiter - US  THYROID   Morbid obesity (HCC) - Amb Ref to Medical Weight Management  No orders of the defined types were placed in this encounter.   No follow-ups on file.   The patient was advised to call back or seek an in-person evaluation if the symptoms worsen or if the condition fails to improve as anticipated.  I discussed the assessment and treatment plan with the patient. The patient was provided an opportunity to ask questions and all were answered. The patient agreed with the plan and demonstrated an understanding of the instructions.  I, Beulah Capobianco, PA-C have reviewed all documentation for this visit. The documentation on 03/05/2024  for the exam, diagnosis, procedures, and orders are all accurate and complete.  Jolynn Spencer, [facility], MMS [facility] [phone] (phone) [phone] (fax)  [facility]

## 2024-03-06 ENCOUNTER — Encounter: Payer: Self-pay | Admitting: Physician Assistant

## 2024-03-06 ENCOUNTER — Encounter

## 2024-03-08 ENCOUNTER — Encounter

## 2024-03-09 ENCOUNTER — Ambulatory Visit
Admission: RE | Admit: 2024-03-09 | Discharge: 2024-03-09 | Disposition: A | Discharge status: Home / Self Care | Source: Ambulatory Visit | Attending: Physician Assistant | Admitting: Physician Assistant

## 2024-03-09 DIAGNOSIS — E0789 Other specified disorders of thyroid: Secondary | ICD-10-CM | POA: Diagnosis not present

## 2024-03-09 DIAGNOSIS — E049 Nontoxic goiter, unspecified: Secondary | ICD-10-CM | POA: Insufficient documentation

## 2024-03-12 ENCOUNTER — Ambulatory Visit: Payer: Self-pay | Admitting: Physician Assistant

## 2024-03-13 ENCOUNTER — Encounter

## 2024-03-15 ENCOUNTER — Encounter

## 2024-03-18 ENCOUNTER — Encounter: Payer: Self-pay | Admitting: Physician Assistant

## 2024-03-20 ENCOUNTER — Encounter

## 2024-03-22 ENCOUNTER — Encounter

## 2024-03-26 ENCOUNTER — Encounter (INDEPENDENT_AMBULATORY_CARE_PROVIDER_SITE_OTHER): Payer: Self-pay

## 2024-03-27 ENCOUNTER — Encounter

## 2024-03-27 DIAGNOSIS — M25551 Pain in right hip: Secondary | ICD-10-CM | POA: Insufficient documentation

## 2024-03-27 DIAGNOSIS — M47816 Spondylosis without myelopathy or radiculopathy, lumbar region: Secondary | ICD-10-CM | POA: Insufficient documentation

## 2024-03-27 DIAGNOSIS — M25552 Pain in left hip: Secondary | ICD-10-CM | POA: Diagnosis not present

## 2024-03-28 ENCOUNTER — Ambulatory Visit: Admitting: Podiatry

## 2024-03-28 ENCOUNTER — Other Ambulatory Visit: Payer: Self-pay | Admitting: Physical Medicine and Rehabilitation

## 2024-03-28 DIAGNOSIS — G4733 Obstructive sleep apnea (adult) (pediatric): Secondary | ICD-10-CM | POA: Diagnosis not present

## 2024-03-28 DIAGNOSIS — M797 Fibromyalgia: Secondary | ICD-10-CM | POA: Diagnosis not present

## 2024-03-28 DIAGNOSIS — G894 Chronic pain syndrome: Secondary | ICD-10-CM | POA: Diagnosis not present

## 2024-03-28 DIAGNOSIS — M25552 Pain in left hip: Secondary | ICD-10-CM

## 2024-03-29 ENCOUNTER — Encounter

## 2024-03-30 ENCOUNTER — Ambulatory Visit: Admitting: Physician Assistant

## 2024-04-03 ENCOUNTER — Ambulatory Visit (INDEPENDENT_AMBULATORY_CARE_PROVIDER_SITE_OTHER)

## 2024-04-03 ENCOUNTER — Ambulatory Visit (INDEPENDENT_AMBULATORY_CARE_PROVIDER_SITE_OTHER): Admitting: Podiatry

## 2024-04-03 ENCOUNTER — Encounter

## 2024-04-03 DIAGNOSIS — T148XXA Other injury of unspecified body region, initial encounter: Secondary | ICD-10-CM

## 2024-04-03 DIAGNOSIS — M79671 Pain in right foot: Secondary | ICD-10-CM

## 2024-04-03 NOTE — Progress Notes (Signed)
 Subjective:  Patient ID: Denise Spencer, female    DOB: 03-05-1961,  MRN: [id number]  No chief complaint on file.   63 y.o. female presents with the above complaint.  Patient presents with complaint of right foot pain.  She states she has surgery done a little while ago and wanted to make sure everything is okay.  She states that she tripped and fell.  She is known to Dr. Tennis who did the surgery.  Denies any other acute complaints just wants to make sure everything is healing okay   Review of Systems: Negative except as noted in the HPI. Denies N/V/F/Ch.  Past Medical History:  Diagnosis Date   Allergy 1970's   Anxiety    Arthritis    knees, lower back   Asthma    Bipolar disorder (HCC)    Bulging lumbar disc    L5-S1   COPD (chronic obstructive pulmonary disease) (HCC)    MILD   Depression    Dyspnea    Fibromyalgia    GERD (gastroesophageal reflux disease)    PONV (postoperative nausea and vomiting)    with hysterectomy only   Sleep apnea    pt no longer uses CPAP since losing weight and states that she currently has no issues   Wears dentures    full upper and lower    Current Outpatient Medications:    albuterol  (VENTOLIN  HFA) 108 (90 Base) MCG/ACT inhaler, Inhale 2 puffs into the lungs every 6 (six) hours as needed for wheezing or shortness of breath., Disp: 18 g, Rfl: 3   atorvastatin  (LIPITOR) 20 MG tablet, Take 1 tab daily by mouth, Disp: 90 tablet, Rfl: 1   Cholecalciferol (VITAMIN D3 MAXIMUM STRENGTH) 125 MCG (5000 UT) capsule, Take 5,000 Units by mouth daily., Disp: , Rfl:    clonazePAM  (KLONOPIN ) 1 MG tablet, Take 1 tablet (1 mg total) by mouth 4 (four) times daily as needed., Disp: 270 tablet, Rfl: 1   cyclobenzaprine (FLEXERIL) 5 MG tablet, Take 5 mg by mouth 3 (three) times daily as needed for muscle spasms., Disp: , Rfl:    gabapentin  (NEURONTIN ) 300 MG capsule, Take 1 capsule (300 mg total) by mouth 3 (three) times daily for 7 days., Disp: 21  capsule, Rfl: 0   nitrofurantoin , macrocrystal-monohydrate, (MACROBID ) 100 MG capsule, Take 1 capsule (100 mg total) by mouth 2 (two) times daily., Disp: 10 capsule, Rfl: 0   nortriptyline  (PAMELOR ) 75 MG capsule, Take 75 mg by mouth at bedtime., Disp: , Rfl:    pantoprazole  (PROTONIX ) 40 MG tablet, Take 1 tablet (40 mg total) by mouth 2 (two) times daily. As needed, Disp: 180 tablet, Rfl: 3   QUEtiapine  (SEROQUEL  XR) 50 MG TB24 24 hr tablet, Take 50 mg by mouth at bedtime., Disp: , Rfl:    sertraline  (ZOLOFT ) 100 MG tablet, Take 1.5 tablets (150 mg total) by mouth daily., Disp: 135 tablet, Rfl: 1  Social History   Tobacco Use  Smoking Status Former   Current packs/day: 0.00   Average packs/day: 0.5 packs/day for 45.4 years (22.7 ttl pk-yrs)   Types: Cigarettes   Start date: 05/04/1977   Quit date: 09/13/2022   Years since quitting: 1.5  Smokeless Tobacco Never  Tobacco Comments    Quit in 2018 but has since restarted after losing her daughter 04/2020    Allergies  Allergen Reactions   Advair Diskus [Fluticasone -Salmeterol]     Caused thrush   Aspirin     Muscle and joint pain  Breo Ellipta  [Fluticasone  Furoate-Vilanterol]     Caused thrush   Nsaids Nausea And Vomiting   Tape     Some adhesives cause blisters.  Tegaderm is OK.   Amoxicillin Rash   Doxycycline Rash   Erythromycin Rash   Penicillins Rash   Sulfa Antibiotics Rash        Objective:  There were no vitals filed for this visit. There is no height or weight on file to calculate BMI. Constitutional Well developed. Well nourished.  Vascular Dorsalis pedis pulses palpable bilaterally. Posterior tibial pulses palpable bilaterally. Capillary refill normal to all digits.  No cyanosis or clubbing noted. Pedal hair growth normal.  Neurologic Normal speech. Oriented to person, place, and time. Epicritic sensation to light touch grossly present bilaterally.  Dermatologic Nails well groomed and normal in  appearance. No open wounds. No skin lesions.  Orthopedic: Pain on palpation right first metatarsophalangeal joint pain pain circumferential around the soft tissue.  No open wounds or lesion noted.  Mild ecchymosis and swelling noted.  No gross deformity noted   Radiographs: 3 views of skeletally mature adult right foot:Hardware is intact no signs of backing out or loosening noted.  No breakage of hardware noted.  She has a history of first metatarsophalangeal joint fusion and first tarsometatarsal joint fusion. Assessment:   1. Contusion of soft tissue    Plan:  Patient was evaluated and treated and all questions answered.  Right foot soft tissue contusion without any damage to underlying hardware - All questions and concerns were discussed with the patient in extensive detail given the amount of pain she is experiencing she will benefit from cam boot immobilization to allow the soft tissue structure to heal appropriately for next few weeks.  I encouraged her that it generally takes 4 to 6 weeks for the soft tissue pain to decrease.  She states understanding if it continues to bother her she will come back and see Dr. Silva  No follow-ups on file.

## 2024-04-04 DIAGNOSIS — Z91199 Patient's noncompliance with other medical treatment and regimen due to unspecified reason: Secondary | ICD-10-CM | POA: Diagnosis not present

## 2024-04-04 DIAGNOSIS — G894 Chronic pain syndrome: Secondary | ICD-10-CM | POA: Diagnosis not present

## 2024-04-04 DIAGNOSIS — Z7289 Other problems related to lifestyle: Secondary | ICD-10-CM | POA: Diagnosis not present

## 2024-04-05 ENCOUNTER — Encounter

## 2024-04-05 ENCOUNTER — Ambulatory Visit: Admitting: Physician Assistant

## 2024-04-10 ENCOUNTER — Encounter

## 2024-04-10 ENCOUNTER — Other Ambulatory Visit

## 2024-04-12 ENCOUNTER — Encounter

## 2024-04-17 ENCOUNTER — Encounter

## 2024-04-17 ENCOUNTER — Other Ambulatory Visit: Payer: Self-pay | Admitting: Physical Medicine and Rehabilitation

## 2024-04-17 ENCOUNTER — Ambulatory Visit
Admission: RE | Admit: 2024-04-17 | Discharge: 2024-04-17 | Disposition: A | Discharge status: Home / Self Care | Source: Ambulatory Visit | Attending: Physical Medicine and Rehabilitation | Admitting: Physical Medicine and Rehabilitation

## 2024-04-17 ENCOUNTER — Other Ambulatory Visit

## 2024-04-17 ENCOUNTER — Other Ambulatory Visit: Payer: Self-pay | Admitting: Medical Genetics

## 2024-04-17 ENCOUNTER — Ambulatory Visit

## 2024-04-17 DIAGNOSIS — M25552 Pain in left hip: Secondary | ICD-10-CM | POA: Diagnosis not present

## 2024-04-17 DIAGNOSIS — M47816 Spondylosis without myelopathy or radiculopathy, lumbar region: Secondary | ICD-10-CM

## 2024-04-17 DIAGNOSIS — Z006 Encounter for examination for normal comparison and control in clinical research program: Secondary | ICD-10-CM

## 2024-04-17 MED ORDER — TECHNETIUM TC 99M MEDRONATE IV KIT
20.0000 | PACK | Freq: Once | INTRAVENOUS | Status: AC | PRN
  Administered 2024-04-17: 21.45 via INTRAVENOUS

## 2024-04-19 ENCOUNTER — Encounter

## 2024-04-22 ENCOUNTER — Other Ambulatory Visit: Payer: Self-pay

## 2024-04-22 DIAGNOSIS — Z5321 Procedure and treatment not carried out due to patient leaving prior to being seen by health care provider: Secondary | ICD-10-CM | POA: Diagnosis not present

## 2024-04-22 DIAGNOSIS — M47816 Spondylosis without myelopathy or radiculopathy, lumbar region: Secondary | ICD-10-CM | POA: Diagnosis present

## 2024-04-22 DIAGNOSIS — H9201 Otalgia, right ear: Secondary | ICD-10-CM | POA: Insufficient documentation

## 2024-04-22 NOTE — ED Triage Notes (Signed)
 Pt reports she wore a cheap pair of earrings yesterday and today noticed some swelling behind her right ear with some pain.

## 2024-04-23 ENCOUNTER — Ambulatory Visit
Admission: RE | Admit: 2024-04-23 | Discharge: 2024-04-23 | Disposition: A | Source: Ambulatory Visit | Attending: Physical Medicine and Rehabilitation | Admitting: Physical Medicine and Rehabilitation

## 2024-04-23 ENCOUNTER — Emergency Department
Admission: EM | Admit: 2024-04-23 | Discharge: 2024-04-23 | Discharge status: Against Medical Advice | Attending: Physical Medicine and Rehabilitation | Admitting: Physical Medicine and Rehabilitation

## 2024-04-23 DIAGNOSIS — H9201 Otalgia, right ear: Secondary | ICD-10-CM | POA: Diagnosis not present

## 2024-04-23 DIAGNOSIS — M47816 Spondylosis without myelopathy or radiculopathy, lumbar region: Secondary | ICD-10-CM | POA: Insufficient documentation

## 2024-04-23 NOTE — ED Notes (Signed)
No answer when called for treatment room.  ?

## 2024-04-23 NOTE — ED Notes (Signed)
No answer when called for treatment room x2

## 2024-04-24 ENCOUNTER — Encounter: Payer: Self-pay | Admitting: Physician Assistant

## 2024-04-24 ENCOUNTER — Telehealth: Payer: Self-pay

## 2024-04-24 ENCOUNTER — Encounter

## 2024-04-24 NOTE — Telephone Encounter (Signed)
 Please review phone encounter regarding message.

## 2024-04-24 NOTE — Telephone Encounter (Signed)
 Pt sent a MyChart message requesting refill for Atorvastatin , pt was advised pt should have enough refills til end of December. Called [facility] and stated pt last dispense was 03/07/24 qty:45.   Pt was asked how is she taking medication and stated only 1 tablet daily. After talking with provider pt has been getting two refills with different instructions. After speaking with [facility] again the medication sent in from April has been cancelled since it was 10 mg. Per Janna pt is to take 20 mg. I requested if pt had enough refills and pharmacist stated pt requested [phone]   Spoke with [facility] pharmacy tech and asked regarding if pt had enough refills. Medication was filled but pt never picked up and was ready 2 weeks ago, I have requested to refill medication for pt and I will call pt to make aware. After this refill pt will not have refills and only 90 tablets will be dispensed.   4:58 pm called pt and was made aware of the ongoing situation, pt stated she was not aware she was getting two different medication dosages when picking up her meds. I advised her medication will be filled at [facility] since she transferred it pt stated she doesn't recall but will pick it up.

## 2024-04-26 ENCOUNTER — Encounter

## 2024-05-01 ENCOUNTER — Encounter

## 2024-05-03 ENCOUNTER — Encounter

## 2024-05-08 ENCOUNTER — Encounter

## 2024-05-08 ENCOUNTER — Encounter: Payer: Self-pay | Admitting: Physician Assistant

## 2024-05-10 ENCOUNTER — Encounter

## 2024-05-15 ENCOUNTER — Encounter

## 2024-05-21 ENCOUNTER — Encounter: Payer: Self-pay | Admitting: Sleep Medicine

## 2024-05-21 ENCOUNTER — Ambulatory Visit: Admitting: Sleep Medicine

## 2024-05-21 VITALS — BP 130/80 | HR 76 | Temp 98.4°F | Ht 67.0 in | Wt 208.9 lb

## 2024-05-21 DIAGNOSIS — G4733 Obstructive sleep apnea (adult) (pediatric): Secondary | ICD-10-CM | POA: Diagnosis not present

## 2024-05-21 DIAGNOSIS — Z6832 Body mass index (BMI) 32.0-32.9, adult: Secondary | ICD-10-CM

## 2024-05-21 DIAGNOSIS — E669 Obesity, unspecified: Secondary | ICD-10-CM | POA: Diagnosis not present

## 2024-05-21 NOTE — Patient Instructions (Signed)

## 2024-05-21 NOTE — Progress Notes (Signed)
 Name:Denise Spencer MRN: [id number] DOB: December 02, 1960   CHIEF COMPLAINT:  CPAP F/U   HISTORY OF PRESENT ILLNESS:  Mrs. He is a 63 y.o. w/ a h/o OSA, hyperlipidemia, anxiety, depression and obesity who presents for CPAP almost every night, which is confirmed by compliance data. She is currently using the Airfit N30i nasal mask, which is comfortable. Reports feeling more refreshed upon awakening with CPAP therapy.    EPWORTH SLEEP SCORE    08/09/2023   10:00 AM  Results of the Epworth flowsheet  Sitting and reading 0  Watching TV 0  Sitting, inactive in a public place (e.g. a theatre or a meeting) 0  As a passenger in a car for an hour without a break 0  Lying down to rest in the afternoon when circumstances permit 1  Sitting and talking to someone 0  Sitting quietly after a lunch without alcohol 0  In a car, while stopped for a few minutes in traffic 0  Total score 1    PAST MEDICAL HISTORY :   has a past medical history of Allergy (1970's), Anxiety, Arthritis, Asthma, Bipolar disorder (HCC), Bulging lumbar disc, COPD (chronic obstructive pulmonary disease) (HCC), Depression, Dyspnea, Fibromyalgia, GERD (gastroesophageal reflux disease), PONV (postoperative nausea and vomiting), Sleep apnea, and Wears dentures.  has a past surgical history that includes Cesarean section; Abdominal hysterectomy w/ partial vaginactomy; Wrist ganglion excision; Colonoscopy; Colonoscopy with propofol  (N/A, 11/01/2016); Esophagogastroduodenoscopy (egd) with propofol  (N/A, 11/01/2016); polypectomy (11/01/2016); Septoplasty (Left, 12/27/2016); Cardiac catheterization (2012); Hip Arthroplasty (Left, 04/2020); Colonoscopy with propofol  (N/A, 05/22/2021); polypectomy (N/A, 05/22/2021); Tubal ligation; Anterior cervical decomp/discectomy fusion (N/A, 12/15/2022); Joint replacement (2021); Spine surgery (2024); and Abdominal hysterectomy (2002). Prior to Admission medications   Medication Sig Start  Date End Date Taking? Authorizing Provider  albuterol  (VENTOLIN  HFA) 108 (90 Base) MCG/ACT inhaler Inhale 2 puffs into the lungs every 6 (six) hours as needed for wheezing or shortness of breath.   Yes [provider]  atorvastatin  (LIPITOR) 20 MG tablet Take 0.5 tablets (10 mg total) by mouth daily. 10/13/23  Yes Ostwalt, Janna, PA-C  Cholecalciferol (VITAMIN D3 MAXIMUM STRENGTH) 125 MCG (5000 UT) capsule Take 5,000 Units by mouth daily.   Yes [provider]  clonazePAM  (KLONOPIN ) 1 MG tablet Take 1 tablet (1 mg total) by mouth 4 (four) times daily as needed. Patient taking differently: Take 1-2 mg by mouth See admin instructions. Take 2 mg at night, may take a 1 mg dose twice daily as needed for anxiety 10/19/22  Yes [name], Norleen DASEN, MD  gabapentin  (NEURONTIN ) 300 MG capsule Take 1 capsule (300 mg total) by mouth 3 (three) times daily for 7 days. 10/14/23 12/06/23 Yes McDonald, Juliene SAUNDERS, DPM  metaxalone (SKELAXIN) 800 MG tablet Take 800 mg by mouth 3 (three) times daily as needed. 08/27/23  Yes [provider]  nortriptyline  (PAMELOR ) 75 MG capsule Take 75 mg by mouth at bedtime. 11/30/23  Yes [provider]  pantoprazole  (PROTONIX ) 40 MG tablet Take 1 tablet (40 mg total) by mouth daily. 09/28/23  Yes Ostwalt, Janna, PA-C  QUEtiapine  (SEROQUEL  XR) 50 MG TB24 24 hr tablet Take 50 mg by mouth at bedtime. 08/11/23  Yes [provider]  sertraline  (ZOLOFT ) 100 MG tablet Take 1.5 tablets (150 mg total) by mouth daily. 10/19/22  Yes [name], Norleen DASEN, MD  traMADol  (ULTRAM ) 50 MG tablet Take 50 mg by mouth every 6 (six) hours as needed for moderate pain.  Yes [provider]   Allergies  Allergen Reactions   Advair Diskus [Fluticasone -Salmeterol]     Caused thrush   Aspirin     Muscle and joint pain   Breo Ellipta  [Fluticasone  Furoate-Vilanterol]     Caused thrush   Nsaids Nausea And Vomiting   Tape     Some adhesives cause blisters.  Tegaderm is OK.    Amoxicillin Rash   Doxycycline Rash   Erythromycin Rash   Penicillins Rash   Sulfa Antibiotics Rash         FAMILY HISTORY:  family history includes Alcohol abuse in her sister; Anxiety disorder in her mother, sister, and sister; Arthritis in her father and mother; Asthma in her sister; Bipolar disorder in her mother, sister, sister, and sister; Breast cancer (age of onset: 13) in her mother; Cancer in her father, mother, and sister; Depression in her mother and sister; Early death in her sister; Heart attack in her father; Heart disease in her father; Hypertension in her father, sister, and sister; Learning disabilities in her sister; Lymphoma in her sister; Miscarriages / Stillbirths in her sister and sister; Multiple sclerosis in her sister and another family member; Obesity in her mother and sister; Thyroid  cancer in her father; Varicose Veins in her mother. SOCIAL HISTORY:  reports that she quit smoking about 20 months ago. Her smoking use included cigarettes. She started smoking about 47 years ago. She has a 22.7 pack-year smoking history. She has never used smokeless tobacco. She reports that she does not drink alcohol and does not use drugs.   Review of Systems:  Gen:  Denies  fever, sweats, chills weight loss  HEENT: Denies blurred vision, double vision, ear pain, eye pain, hearing loss, nose bleeds, sore throat Cardiac:  No dizziness, chest pain or heaviness, chest tightness,edema, No JVD Resp:   No cough, -sputum production, -shortness of breath,-wheezing, -hemoptysis,  Gi: Denies swallowing difficulty, stomach pain, nausea or vomiting, diarrhea, constipation, bowel incontinence Gu:  Denies bladder incontinence, burning urine Ext:   Denies Joint pain, stiffness or swelling Skin: Denies  skin rash, easy bruising or bleeding or hives Endoc:  Denies polyuria, polydipsia , polyphagia or weight change Psych:   Denies depression, insomnia or hallucinations  Other:  All other systems  negative  VITAL SIGNS: BP 130/80   Pulse 76   Temp 98.4 F (36.9 C)   Ht 5' 7 (1.702 m)   Wt 208 lb 14.4 oz (94.8 kg)   SpO2 99%   BMI 32.72 kg/m     Physical Examination:   General Appearance: No distress  EYES PERRLA, EOM intact.   NECK Supple, No JVD Pulmonary: normal breath sounds, No wheezing.  CardiovascularNormal S1,S2.  No m/r/g.   Abdomen: Benign, Soft, non-tender. Skin:   warm, no rashes, no ecchymosis  Extremities: normal, no cyanosis, clubbing. Neuro:without focal findings,  speech normal  PSYCHIATRIC: Mood, affect within normal limits.   ASSESSMENT AND PLAN  OSA Patient is using and benefiting from CPAP therapy. Counseled patient on increasing total sleep time to 7-8 hours per night. Discussed the consequences of untreated sleep apnea. Advised not to drive drowsy for safety of patient and others. Will follow up in 6 months.   Obesity Counseled patient on diet and lifestyle modification.    Patient  satisfied with Plan of action and management. All questions answered  I spent a total of 22 minutes reviewing chart data, face-to-face evaluation with the patient, counseling and coordination of care as detailed above.  Borghild Thaker, M.D.  Sleep Medicine [facility]

## 2024-05-22 ENCOUNTER — Encounter

## 2024-05-24 ENCOUNTER — Encounter

## 2024-05-29 ENCOUNTER — Encounter

## 2024-05-31 ENCOUNTER — Encounter

## 2024-06-03 NOTE — Progress Notes (Unsigned)
 Established patient visit  Patient: Denise Spencer   DOB: [birth year]   63 y.o. Female  MRN: [id number] Visit Date: 06/04/2024  Today's healthcare provider: Jolynn Spencer, PA-C   No chief complaint on file.  Subjective       Discussed the use of AI scribe software for clinical note transcription with the patient, who gave verbal consent to proceed.  History of Present Illness        03/05/2024    1:13 PM 01/04/2024    3:07 PM 09/29/2023    2:01 PM  Depression screen PHQ 2/9  Decreased Interest 0 1 0  Down, Depressed, Hopeless 0 1 0  PHQ - 2 Score 0 2 0  Altered sleeping 0 1 2  Tired, decreased energy 3 1 2   Change in appetite 2 1 0  Feeling bad or failure about yourself  0 0   Trouble concentrating 0 1 0  Moving slowly or fidgety/restless 0 0 0  Suicidal thoughts 0 0 0  PHQ-9 Score 5  6  4    Difficult doing work/chores Somewhat difficult Not difficult at all Somewhat difficult     Data saved with a previous flowsheet row definition      03/05/2024    1:13 PM 01/04/2024    3:08 PM 09/29/2023    2:02 PM 08/04/2023   10:23 AM  GAD 7 : Generalized Anxiety Score  Nervous, Anxious, on Edge 1 0 1 2  Control/stop worrying 1 0 0 0  Worry too much - different things 1 0 0 2  Trouble relaxing 2 0 0 3  Restless 0 0 0 0  Easily annoyed or irritable 1 0 0 3  Afraid - awful might happen 0 0 0 0  Total GAD 7 Score 6 0 1 10  Anxiety Difficulty Somewhat difficult Not difficult at all Somewhat difficult Not difficult at all    Medications: Show/hide medication list[1]  Review of Systems  All other systems reviewed and are negative.  All negative Except see HPI   {Insert previous labs (optional):23779} {See past labs  Heme  Chem  Endocrine  Serology  Results Review (optional):1}   Objective    There were no vitals taken for this visit. {Insert last BP/Wt (optional):23777}{See vitals history (optional):1}   Physical Exam Vitals reviewed.  Constitutional:       General: She is not in acute distress.    Appearance: Normal appearance. She is well-developed. She is not diaphoretic.  HENT:     Head: Normocephalic and atraumatic.  Eyes:     General: No scleral icterus.    Conjunctiva/sclera: Conjunctivae normal.  Neck:     Thyroid : No thyromegaly.  Cardiovascular:     Rate and Rhythm: Normal rate and regular rhythm.     Pulses: Normal pulses.     Heart sounds: Normal heart sounds. No murmur heard. Pulmonary:     Effort: Pulmonary effort is normal. No respiratory distress.     Breath sounds: Normal breath sounds. No wheezing, rhonchi or rales.  Musculoskeletal:     Cervical back: Neck supple.     Right lower leg: No edema.     Left lower leg: No edema.  Lymphadenopathy:     Cervical: No cervical adenopathy.  Skin:    General: Skin is warm and dry.     Findings: No rash.  Neurological:     Mental Status: She is alert and oriented to person, place, and time. Mental status is at baseline.  Psychiatric:  Mood and Affect: Mood normal.        Behavior: Behavior normal.     No results found for any visits on 06/04/24.      Assessment and Plan Assessment & Plan     No orders of the defined types were placed in this encounter.   No follow-ups on file.   The patient was advised to call back or seek an in-person evaluation if the symptoms worsen or if the condition fails to improve as anticipated.  I discussed the assessment and treatment plan with the patient. The patient was provided an opportunity to ask questions and all were answered. The patient agreed with the plan and demonstrated an understanding of the instructions.  I, Oren Barella, PA-C have reviewed all documentation for this visit. The documentation on 06/04/2024  for the exam, diagnosis, procedures, and orders are all accurate and complete.  Jolynn Spencer, [facility], MMS [facility] [phone] (phone) [phone] (fax)  [facility]      [1] Outpatient Medications Prior to Visit  Medication Sig   albuterol  (VENTOLIN  HFA) 108 (90 Base) MCG/ACT inhaler Inhale 2 puffs into the lungs every 6 (six) hours as needed for wheezing or shortness of breath.   atorvastatin  (LIPITOR) 20 MG tablet Take 1 tab daily by mouth   celecoxib (CELEBREX) 100 MG capsule Take 100 mg by mouth 2 (two) times daily as needed.   Cholecalciferol (VITAMIN D3 MAXIMUM STRENGTH) 125 MCG (5000 UT) capsule Take 5,000 Units by mouth daily.   clindamycin  (CLEOCIN ) 300 MG capsule Take 300 mg by mouth 4 (four) times daily.   clonazePAM  (KLONOPIN ) 1 MG tablet Take 1 tablet (1 mg total) by mouth 4 (four) times daily as needed.   cyclobenzaprine (FLEXERIL) 5 MG tablet Take 5 mg by mouth 3 (three) times daily as needed for muscle spasms.   gabapentin  (NEURONTIN ) 300 MG capsule Take 1 capsule (300 mg total) by mouth 3 (three) times daily for 7 days.   HYDROcodone -acetaminophen  (NORCO/VICODIN) 5-325 MG tablet Take 1 tablet by mouth 3 (three) times daily as needed.   Loratadine 10 MG CAPS Take 10 mg by mouth.   metaxalone (SKELAXIN) 400 MG tablet Take 400 mg by mouth 3 (three) times daily as needed.   metaxalone (SKELAXIN) 800 MG tablet Take 800 mg by mouth 3 (three) times daily as needed.   nitrofurantoin , macrocrystal-monohydrate, (MACROBID ) 100 MG capsule Take 1 capsule (100 mg total) by mouth 2 (two) times daily.   nortriptyline  (PAMELOR ) 75 MG capsule Take 75 mg by mouth at bedtime.   Omeprazole 20 MG TBEC Take 20 mg by mouth.   pantoprazole  (PROTONIX ) 40 MG tablet Take 1 tablet (40 mg total) by mouth 2 (two) times daily. As needed   predniSONE (DELTASONE) 5 MG tablet Take 5 mg by mouth daily with breakfast. follow package directions   QUEtiapine  (SEROQUEL  XR) 50 MG TB24 24 hr tablet Take 50 mg by mouth at bedtime.   sertraline  (ZOLOFT ) 100 MG tablet Take 1.5 tablets (150 mg total) by mouth daily.   Suzetrigine (JOURNAVX) 50 MG TABS Take 1 tablet  by mouth daily as needed.   tiZANidine  (ZANAFLEX ) 4 MG tablet Take 4 mg by mouth 4 (four) times daily as needed.   traMADol  (ULTRAM ) 50 MG tablet Take 50 mg by mouth 3 (three) times daily as needed. for pain   No facility-administered medications prior to visit.

## 2024-06-04 ENCOUNTER — Ambulatory Visit: Admitting: Sleep Medicine

## 2024-06-04 ENCOUNTER — Encounter: Payer: Self-pay | Admitting: Physician Assistant

## 2024-06-04 ENCOUNTER — Ambulatory Visit (INDEPENDENT_AMBULATORY_CARE_PROVIDER_SITE_OTHER): Admitting: Physician Assistant

## 2024-06-04 VITALS — BP 129/80 | HR 91 | Resp 16 | Ht 67.0 in | Wt 205.7 lb

## 2024-06-04 DIAGNOSIS — E7849 Other hyperlipidemia: Secondary | ICD-10-CM | POA: Diagnosis not present

## 2024-06-04 DIAGNOSIS — F339 Major depressive disorder, recurrent, unspecified: Secondary | ICD-10-CM

## 2024-06-04 DIAGNOSIS — E669 Obesity, unspecified: Secondary | ICD-10-CM | POA: Diagnosis not present

## 2024-06-04 DIAGNOSIS — F3181 Bipolar II disorder: Secondary | ICD-10-CM | POA: Diagnosis not present

## 2024-06-04 DIAGNOSIS — E049 Nontoxic goiter, unspecified: Secondary | ICD-10-CM | POA: Diagnosis not present

## 2024-06-04 DIAGNOSIS — R10A0 Flank pain, unspecified side: Secondary | ICD-10-CM | POA: Diagnosis not present

## 2024-06-04 DIAGNOSIS — R7303 Prediabetes: Secondary | ICD-10-CM | POA: Diagnosis not present

## 2024-06-04 DIAGNOSIS — E038 Other specified hypothyroidism: Secondary | ICD-10-CM

## 2024-06-04 DIAGNOSIS — Z23 Encounter for immunization: Secondary | ICD-10-CM

## 2024-06-04 DIAGNOSIS — G5700 Lesion of sciatic nerve, unspecified lower limb: Secondary | ICD-10-CM | POA: Diagnosis not present

## 2024-06-04 DIAGNOSIS — F419 Anxiety disorder, unspecified: Secondary | ICD-10-CM

## 2024-06-04 DIAGNOSIS — E7800 Pure hypercholesterolemia, unspecified: Secondary | ICD-10-CM

## 2024-06-06 ENCOUNTER — Ambulatory Visit: Admitting: Podiatry

## 2024-06-22 ENCOUNTER — Ambulatory Visit: Payer: Self-pay

## 2024-06-22 NOTE — Telephone Encounter (Signed)
" °  FYI Only or Action Required?: FYI only for provider: appointment scheduled on 1/5.  Patient was last seen in primary care on 06/04/2024 by Ostwalt, Janna, PA-C.  Called Nurse Triage reporting Neck Pain.  Symptoms began several years ago.  Interventions attempted: Nothing.  Symptoms are: unchanged.  Triage Disposition: See PCP When Office is Open (Within 3 Days)  Patient/caregiver understands and will follow disposition?: Yes         Copied from CRM [id number]. Topic: Clinical - Red Word Triage >> Jun 22, 2024 12:04 PM Wess RAMAN wrote: Red Word that prompted transfer to Nurse Triage: Patient is unable to get her medicine due to pain management clinic closing down. Pain between shoulder blades and neck. Pain level 7  Medications: cyclobenzaprine (FLEXERIL) 5 MG tablet  gabapentin  (NEURONTIN ) 300 MG capsule  nortriptyline  (PAMELOR ) 75 MG capsule  Pharmacy: [id number] [address], [place] - [address] AT [facility] OF [address] [place] [address] Phone: [phone] Fax: [phone] Hours: Not open 24 hours Reason for Disposition  [1] MODERATE neck pain (e.g., interferes with normal activities) AND [2] present > 3 days  Answer Assessment - Initial Assessment Questions 1. ONSET: When did the pain begin?      Chronic 30 years  2. LOCATION: Where does it hurt?      Back of neck   3. PATTERN Does the pain come and go, or has it been constant since it started?  Constant        4. SEVERITY: How bad is the pain?  (Scale 0-10; or none or slight stiffness, mild, moderate, severe)     7/10  5. RADIATION: Does the pain go anywhere else, shoot into your arms?     BIL shoulder blades  6. CORD SYMPTOMS: Any weakness or numbness of the arms or legs?     No   7. CAUSE: What do you think is causing the neck pain?     Bulging disk, various spine issues   8. NECK OVERUSE: Any recent activities that involved  turning or twisting the neck?     No    9. OTHER SYMPTOMS: Do you have any other symptoms? (e.g., headache, fever, chest pain, difficulty breathing, neck swelling)     No    Patient called in to triage with complaints of neck pain. This has been ongoing for 30 years. The patient stated her pain clinic suddenly shut down with no notice.  For home care, the patient is taking OTC Tylenol , but is seeking prescriptions on the cyclobenzaprine (FLEXERIL) 5 MG tablet gabapentin  (NEURONTIN ) 300 MG capsule nortriptyline  (PAMELOR ) 75 MG capsule   Appointment scheduled for further evaluation; and agrees with the plan of care, and will reach out if symptoms worsen or persist.  Protocols used: Neck Pain or Stiffness-A-AH  "

## 2024-06-25 ENCOUNTER — Encounter: Payer: Self-pay | Admitting: Family Medicine

## 2024-06-25 ENCOUNTER — Ambulatory Visit (INDEPENDENT_AMBULATORY_CARE_PROVIDER_SITE_OTHER): Admitting: Family Medicine

## 2024-06-25 VITALS — BP 117/78 | HR 97 | Resp 16 | Ht 67.0 in | Wt 205.0 lb

## 2024-06-25 DIAGNOSIS — G8929 Other chronic pain: Secondary | ICD-10-CM

## 2024-06-25 DIAGNOSIS — F3181 Bipolar II disorder: Secondary | ICD-10-CM

## 2024-06-25 DIAGNOSIS — R252 Cramp and spasm: Secondary | ICD-10-CM | POA: Diagnosis not present

## 2024-06-25 DIAGNOSIS — M545 Low back pain, unspecified: Secondary | ICD-10-CM

## 2024-06-25 MED ORDER — CYCLOBENZAPRINE HCL 5 MG PO TABS: 5.0000 mg | ORAL_TABLET | Freq: Three times a day (TID) | ORAL | 5 refills | Status: AC | PRN

## 2024-06-25 NOTE — Progress Notes (Signed)
 "     Established patient visit   Patient: Denise Spencer   DOB: 02-19-1961   64 y.o. Female  MRN: [id number] Visit Date: 06/25/2024  Today's healthcare provider: Nancyann Perry, MD   Chief Complaint  Patient presents with   Acute Visit   Neck Pain    Neck pain ( pain clinic closed down)    Subjective    Discussed the use of AI scribe software for clinical note transcription with the patient, who gave verbal consent to proceed.  History of Present Illness   Denise Spencer is a 64 year old female who presents with muscle pain due to discontinuation of cyclobenzaprine .  She has been experiencing significant muscle pain since her online pain management clinic closed on June 04, 2025, without notice, leading to a lapse in her cyclobenzaprine  prescription. She had been taking cyclobenzaprine  5 mg three times a day for over a year, which she found to be the most effective muscle relaxant after trying others like metaxalone and tizanidine .  She is currently taking nortriptyline  and gabapentin , both of which were prescribed by the pain clinic. Nortriptyline  is taken to aid sleep and manage pain, while gabapentin  is taken at a dose of 300 mg three times a day. She also uses Celebrex as needed for pain, but not on a daily basis.  Previously, she had been prescribed metaxalone, which she stopped using in August 2025 due to waning effectiveness. She also mentioned using hydrocodone  and prednisone in the past, but these are not part of her current regimen.  She continues to use the [facility] on [address] and [address] for her prescriptions.      Medications: Show/hide medication list[1] Review of Systems     Objective    BP 117/78 (BP Location: Left Arm, Patient Position: Sitting, Cuff Size: Normal)   Pulse 97   Resp 16   Ht 5' 7 (1.702 m)   Wt 205 lb (93 kg)   SpO2 100%   BMI 32.11 kg/m   Physical Exam   General appearance: Mildly obese female, cooperative and in  no acute distress Head: Normocephalic, without obvious abnormality, atraumatic Respiratory: Respirations even and unlabored, normal respiratory rate Extremities: All extremities are intact.  Skin: Skin color, texture, turgor normal. No rashes seen  Psych: Appropriate mood and affect. Neurologic: Mental status: Alert, oriented to person, place, and time, thought content appropriate.    Assessment & Plan        1. Chronic low back pain, unspecified back pain laterality, unspecified whether sciatica present (Primary)   2. Spasm Had been well controlled with cyclobenzaprine  previously prescribed through online pain management service, but now shut down. May start back on - cyclobenzaprine  (FLEXERIL ) 5 MG tablet; Take 1 tablet (5 mg total) by mouth 3 (three) times daily as needed for muscle spasms.  Dispense: 90 tablet; Refill: 5  Significant muscle pain due to discontinuation of cyclobenzaprine . Cyclobenzaprine  was effective without significant side effects.  Reconciled her extensive medication list.   3. Bipolar II disorder (HCC) Well controlled. Continue current medications as managed by her psychiartrist.            Nancyann Perry, MD  [facility] [phone] (phone) [phone] (fax)  [facility]    [1]  Outpatient Medications Prior to Visit  Medication Sig Note   albuterol  (VENTOLIN  HFA) 108 (90 Base) MCG/ACT inhaler Inhale 2 puffs into the lungs every 6 (six) hours as needed for wheezing or shortness  of breath.    atorvastatin  (LIPITOR) 20 MG tablet Take 1 tab daily by mouth    celecoxib (CELEBREX) 100 MG capsule Take 100 mg by mouth 2 (two) times daily as needed.    Cholecalciferol (VITAMIN D3 MAXIMUM STRENGTH) 125 MCG (5000 UT) capsule Take 5,000 Units by mouth daily.    clonazePAM  (KLONOPIN ) 1 MG tablet Take 1 tablet (1 mg total) by mouth 4 (four) times daily as needed.    gabapentin  (NEURONTIN ) 300 MG capsule Take 1 capsule  (300 mg total) by mouth 3 (three) times daily for 7 days.    Loratadine 10 MG CAPS Take 10 mg by mouth.    nortriptyline  (PAMELOR ) 75 MG capsule Take 75 mg by mouth at bedtime.    pantoprazole  (PROTONIX ) 40 MG tablet Take 1 tablet (40 mg total) by mouth 2 (two) times daily. As needed    QUEtiapine  (SEROQUEL  XR) 50 MG TB24 24 hr tablet Take 50 mg by mouth at bedtime.    sertraline  (ZOLOFT ) 100 MG tablet Take 1.5 tablets (150 mg total) by mouth daily.    Suzetrigine (JOURNAVX) 50 MG TABS Take 1 tablet by mouth daily as needed.    [DISCONTINUED] cyclobenzaprine  (FLEXERIL ) 5 MG tablet Take 5 mg by mouth 3 (three) times daily as needed for muscle spasms.    [DISCONTINUED] metaxalone (SKELAXIN) 400 MG tablet Take 400 mg by mouth 3 (three) times daily as needed. 06/25/2024: was changed to cyclobenzaprine    [DISCONTINUED] metaxalone (SKELAXIN) 800 MG tablet Take 800 mg by mouth 3 (three) times daily as needed. 06/25/2024: was changed to cyclobenzaprine    [DISCONTINUED] tiZANidine  (ZANAFLEX ) 4 MG tablet Take 4 mg by mouth 4 (four) times daily as needed. 06/25/2024: was changed to cyclobenzaprine    HYDROcodone -acetaminophen  (NORCO/VICODIN) 5-325 MG tablet Take 1 tablet by mouth 3 (three) times daily as needed. (Patient not taking: Reported on 06/25/2024)    nitrofurantoin , macrocrystal-monohydrate, (MACROBID ) 100 MG capsule Take 1 capsule (100 mg total) by mouth 2 (two) times daily. (Patient not taking: Reported on 06/25/2024)    Omeprazole 20 MG TBEC Take 20 mg by mouth. (Patient not taking: Reported on 06/25/2024)    predniSONE (DELTASONE) 5 MG tablet Take 5 mg by mouth daily with breakfast. follow package directions (Patient not taking: Reported on 06/25/2024)    traMADol  (ULTRAM ) 50 MG tablet Take 50 mg by mouth 3 (three) times daily as needed. for pain (Patient not taking: Reported on 06/25/2024)    No facility-administered medications prior to visit.   "

## 2024-06-28 ENCOUNTER — Ambulatory Visit: Admitting: Physician Assistant

## 2024-07-02 ENCOUNTER — Encounter: Payer: Self-pay | Admitting: Sleep Medicine

## 2024-07-02 ENCOUNTER — Ambulatory Visit (INDEPENDENT_AMBULATORY_CARE_PROVIDER_SITE_OTHER): Admitting: Physician Assistant

## 2024-07-02 DIAGNOSIS — Z23 Encounter for immunization: Secondary | ICD-10-CM | POA: Diagnosis not present

## 2024-07-02 NOTE — Progress Notes (Unsigned)
 Patient is in office today for a nurse visit for Immunization. Patient Injection was given in the  Right deltoid. Patient tolerated injection well.

## 2024-07-12 ENCOUNTER — Encounter: Payer: Self-pay | Admitting: Sleep Medicine

## 2024-07-13 ENCOUNTER — Encounter: Payer: Self-pay | Admitting: Sleep Medicine

## 2024-07-13 ENCOUNTER — Telehealth: Admitting: Sleep Medicine

## 2024-07-13 ENCOUNTER — Encounter: Payer: Self-pay | Admitting: Physician Assistant

## 2024-07-13 DIAGNOSIS — E669 Obesity, unspecified: Secondary | ICD-10-CM

## 2024-07-13 DIAGNOSIS — G4733 Obstructive sleep apnea (adult) (pediatric): Secondary | ICD-10-CM

## 2024-07-13 DIAGNOSIS — Z87891 Personal history of nicotine dependence: Secondary | ICD-10-CM | POA: Diagnosis not present

## 2024-07-13 MED ORDER — ZEPBOUND 2.5 MG/0.5ML ~~LOC~~ SOAJ: 2.5000 mg | SUBCUTANEOUS | 1 refills | Status: AC

## 2024-07-13 MED ORDER — ZEPBOUND 2.5 MG/0.5ML ~~LOC~~ SOAJ: 2.5000 mg | SUBCUTANEOUS | 1 refills | Status: DC

## 2024-07-13 NOTE — Progress Notes (Signed)
 "      Name:Denise Spencer MRN: [id number] DOB: Feb 01, 1961   CHIEF COMPLAINT:  CPAP F/U   HISTORY OF PRESENT ILLNESS:  Denise Spencer is a 65 y.o. w/ a h/o OSA, hyperlipidemia, anxiety, depression and obesity who presents via telehealth for CPAP follow up visit. Reports difficulty using CPAP therapy every night due to the mask falling off in the night. States that she would like to try Zepbound as an alternative treatment option for OSA. Reports that she is also working on weight loss with diet and lifestyle modification.    EPWORTH SLEEP SCORE    08/09/2023   10:00 AM  Results of the Epworth flowsheet  Sitting and reading 0  Watching TV 0  Sitting, inactive in a public place (e.g. a theatre or a meeting) 0  As a passenger in a car for an hour without a break 0  Lying down to rest in the afternoon when circumstances permit 1  Sitting and talking to someone 0  Sitting quietly after a lunch without alcohol 0  In a car, while stopped for a few minutes in traffic 0  Total score 1    PAST MEDICAL HISTORY :   has a past medical history of Allergy (1970s), Anxiety, Arthritis, Asthma, Bipolar disorder (HCC), Bulging lumbar disc, COPD (chronic obstructive pulmonary disease) (HCC), Depression, Dyspnea, Fibromyalgia, GERD (gastroesophageal reflux disease), PONV (postoperative nausea and vomiting), Sleep apnea, Sleep apnea in adult (12/29/2023), and Wears dentures.  has a past surgical history that includes Cesarean section; Abdominal hysterectomy w/ partial vaginactomy; Wrist ganglion excision; Colonoscopy; Colonoscopy with propofol  (N/A, 11/01/2016); Esophagogastroduodenoscopy (egd) with propofol  (N/A, 11/01/2016); polypectomy (11/01/2016); Septoplasty (Left, 12/27/2016); Cardiac catheterization (2012); Hip Arthroplasty (Left, 04/2020); Colonoscopy with propofol  (N/A, 05/22/2021); polypectomy (N/A, 05/22/2021); Tubal ligation; Anterior cervical decomp/discectomy fusion (N/A, 12/15/2022); Joint  replacement (2021); Spine surgery (2024); and Abdominal hysterectomy (2002). Prior to Admission medications   Medication Sig Start Date End Date Taking? Authorizing Provider  albuterol  (VENTOLIN  HFA) 108 (90 Base) MCG/ACT inhaler Inhale 2 puffs into the lungs every 6 (six) hours as needed for wheezing or shortness of breath.   Yes [provider]  atorvastatin  (LIPITOR) 20 MG tablet Take 0.5 tablets (10 mg total) by mouth daily. 10/13/23  Yes Ostwalt, Janna, PA-C  Cholecalciferol (VITAMIN D3 MAXIMUM STRENGTH) 125 MCG (5000 UT) capsule Take 5,000 Units by mouth daily.   Yes [provider]  clonazePAM  (KLONOPIN ) 1 MG tablet Take 1 tablet (1 mg total) by mouth 4 (four) times daily as needed. Patient taking differently: Take 1-2 mg by mouth See admin instructions. Take 2 mg at night, may take a 1 mg dose twice daily as needed for anxiety 10/19/22  Yes [name], Norleen DASEN, MD  gabapentin  (NEURONTIN ) 300 MG capsule Take 1 capsule (300 mg total) by mouth 3 (three) times daily for 7 days. 10/14/23 12/06/23 Yes McDonald, Juliene SAUNDERS, DPM  metaxalone (SKELAXIN) 800 MG tablet Take 800 mg by mouth 3 (three) times daily as needed. 08/27/23  Yes [provider]  nortriptyline  (PAMELOR ) 75 MG capsule Take 75 mg by mouth at bedtime. 11/30/23  Yes [provider]  pantoprazole  (PROTONIX ) 40 MG tablet Take 1 tablet (40 mg total) by mouth daily. 09/28/23  Yes Ostwalt, Janna, PA-C  QUEtiapine  (SEROQUEL  XR) 50 MG TB24 24 hr tablet Take 50 mg by mouth at bedtime. 08/11/23  Yes [provider]  sertraline  (ZOLOFT ) 100 MG tablet Take 1.5 tablets (150 mg total) by mouth daily. 10/19/22  Yes  [name], Norleen DASEN, MD  traMADol  (ULTRAM ) 50 MG tablet Take 50 mg by mouth every 6 (six) hours as needed for moderate pain.   Yes [provider]   Allergies  Allergen Reactions   Advair Diskus [Fluticasone -Salmeterol]     Caused thrush   Aspirin     Muscle and joint pain   Breo Ellipta  [Fluticasone   Furoate-Vilanterol]     Caused thrush   Nsaids Nausea And Vomiting   Tape     Some adhesives cause blisters.  Tegaderm is OK.   Amoxicillin Rash   Doxycycline Rash   Erythromycin Rash   Penicillins Rash   Sulfa Antibiotics Rash         FAMILY HISTORY:  family history includes Alcohol abuse in her sister; Anxiety disorder in her mother, sister, and sister; Arthritis in her father and mother; Asthma in her sister; Bipolar disorder in her mother, sister, sister, and sister; Breast cancer (age of onset: 51) in her mother; Cancer in her father, mother, and sister; Depression in her mother and sister; Early death in her sister; Heart attack in her father; Heart disease in her father; Hypertension in her father, sister, and sister; Learning disabilities in her sister; Lymphoma in her sister; Miscarriages / Stillbirths in her sister and sister; Multiple sclerosis in her sister and another family member; Obesity in her mother and sister; Thyroid  cancer in her father; Varicose Veins in her mother. SOCIAL HISTORY:  reports that she quit smoking about 22 months ago. Her smoking use included cigarettes. She started smoking about 47 years ago. She has a 22.7 pack-year smoking history. She has never used smokeless tobacco. She reports that she does not drink alcohol and does not use drugs.   Review of Systems:  Gen:  Denies  fever, sweats, chills weight loss  HEENT: Denies blurred vision, double vision, ear pain, eye pain, hearing loss, nose bleeds, sore throat Cardiac:  No dizziness, chest pain or heaviness, chest tightness,edema, No JVD Resp:   No cough, -sputum production, -shortness of breath,-wheezing, -hemoptysis,  Gi: Denies swallowing difficulty, stomach pain, nausea or vomiting, diarrhea, constipation, bowel incontinence Gu:  Denies bladder incontinence, burning urine Ext:   Denies Joint pain, stiffness or swelling Skin: Denies  skin rash, easy bruising or bleeding or hives Endoc:  Denies  polyuria, polydipsia , polyphagia or weight change Psych:   Denies depression, insomnia or hallucinations  Other:  All other systems negative  VITAL SIGNS: Unavailable, due to telehealth visit.    Physical Examination:   General Appearance: No distress  EYES PERRLA, EOM intact.   NECK Supple, No JVD Pulmonary: normal breath sounds, No wheezing.  CardiovascularNormal S1,S2.  No m/r/g.   Abdomen: Benign, Soft, non-tender. Skin:   warm, no rashes, no ecchymosis  Extremities: normal, no cyanosis, clubbing. Neuro:without focal findings,  speech normal  PSYCHIATRIC: Mood, affect within normal limits.   ASSESSMENT AND PLAN  OSA Due to CPAP intolerance, will try patient on Zepbound 2.5 mg weekly as an alternative therapy and will titrate as tolerated. Denies a family or personal history of thyroid  medullary CA or pancreatitis. Discussed the consequences of untreated sleep apnea. Advised not to drive drowsy for safety of patient and others. Will follow up in 3 months.   Obesity Counseled patient on diet and lifestyle modification. Will try patient on Zepbound and will titrate as tolerated.    Patient  satisfied with Plan of action and management. All questions answered  I spent a total of 33 minutes reviewing  chart data, face-to-face evaluation with the patient, counseling and coordination of care as detailed above.    Brynn Mulgrew, M.D.  Sleep Medicine [facility]        "

## 2024-07-23 ENCOUNTER — Other Ambulatory Visit: Payer: Self-pay | Admitting: Physician Assistant

## 2024-07-23 ENCOUNTER — Ambulatory Visit: Admitting: Podiatry

## 2024-07-23 DIAGNOSIS — E7849 Other hyperlipidemia: Secondary | ICD-10-CM

## 2024-07-26 ENCOUNTER — Telehealth: Payer: Self-pay | Admitting: Acute Care

## 2024-07-26 NOTE — Telephone Encounter (Signed)
 Returned call from VM. Call goes straight to VM.  Left message she may have blocker on phone.  Caller did not leave message regarding reason for call.  It appears she was scheduled for SDMV / LDCT in April 2025 and was no show for the sdmv and LDCT was cancelled.  Patient will need to be rescheduled for both appointments.

## 2024-08-01 ENCOUNTER — Ambulatory Visit: Admitting: Podiatry

## 2024-08-06 ENCOUNTER — Encounter: Payer: 59 | Admitting: Physician Assistant

## 2024-09-25 ENCOUNTER — Ambulatory Visit: Admitting: Sleep Medicine
